# Patient Record
Sex: Female | Born: 1940 | Race: White | Hispanic: No | Smoking: Former smoker
Health system: Southern US, Community
[De-identification: ages and names within clinical notes are randomized; demographics above are authoritative.]

## PROBLEM LIST (undated history)

## (undated) DIAGNOSIS — R52 Pain, unspecified: Secondary | ICD-10-CM

## (undated) DIAGNOSIS — D649 Anemia, unspecified: Secondary | ICD-10-CM

## (undated) DIAGNOSIS — I10 Essential (primary) hypertension: Secondary | ICD-10-CM

## (undated) DIAGNOSIS — I2510 Atherosclerotic heart disease of native coronary artery without angina pectoris: Secondary | ICD-10-CM

## (undated) DIAGNOSIS — G2581 Restless legs syndrome: Secondary | ICD-10-CM

## (undated) DIAGNOSIS — I639 Cerebral infarction, unspecified: Secondary | ICD-10-CM

## (undated) DIAGNOSIS — J9600 Acute respiratory failure, unspecified whether with hypoxia or hypercapnia: Secondary | ICD-10-CM

## (undated) DIAGNOSIS — E119 Type 2 diabetes mellitus without complications: Secondary | ICD-10-CM

## (undated) DIAGNOSIS — M109 Gout, unspecified: Secondary | ICD-10-CM

## (undated) DIAGNOSIS — M1990 Unspecified osteoarthritis, unspecified site: Secondary | ICD-10-CM

## (undated) DIAGNOSIS — E785 Hyperlipidemia, unspecified: Secondary | ICD-10-CM

## (undated) DIAGNOSIS — N189 Chronic kidney disease, unspecified: Secondary | ICD-10-CM

## (undated) DIAGNOSIS — J449 Chronic obstructive pulmonary disease, unspecified: Secondary | ICD-10-CM

## (undated) DIAGNOSIS — K5900 Constipation, unspecified: Secondary | ICD-10-CM

## (undated) DIAGNOSIS — M6281 Muscle weakness (generalized): Secondary | ICD-10-CM

## (undated) HISTORY — DX: Hyperlipidemia, unspecified: E78.5

## (undated) HISTORY — PX: APPENDECTOMY: SHX54

## (undated) HISTORY — DX: Chronic kidney disease, unspecified: N18.9

## (undated) HISTORY — PX: EYE SURGERY: SHX253

## (undated) HISTORY — PX: TONSILLECTOMY: SUR1361

## (undated) HISTORY — DX: Chronic obstructive pulmonary disease, unspecified: J44.9

## (undated) HISTORY — DX: Unspecified osteoarthritis, unspecified site: M19.90

## (undated) HISTORY — DX: Atherosclerotic heart disease of native coronary artery without angina pectoris: I25.10

## (undated) HISTORY — DX: Restless legs syndrome: G25.81

## (undated) HISTORY — PX: ABDOMINAL HYSTERECTOMY: SHX81

---

## 2007-11-05 ENCOUNTER — Ambulatory Visit: Payer: Self-pay | Admitting: Family Medicine

## 2008-11-14 IMAGING — CR DG CHEST 2V
1 series · 2 of 2 positions shown · non-contrast
Comparison: none

REASON FOR EXAM: Chest congestion
COMMENTS:

PROCEDURE:     MDR - MDR CHEST PA(OR AP) AND LATERAL  - November 05, 2007  [DATE]
RESULT:     The lungs are clear. The cardiac silhouette and visualized bony
skeleton are unremarkable.

[Series 1: view not recorded · 0.17mm/px · 2 of 2 slices shown]
[im 1/2]
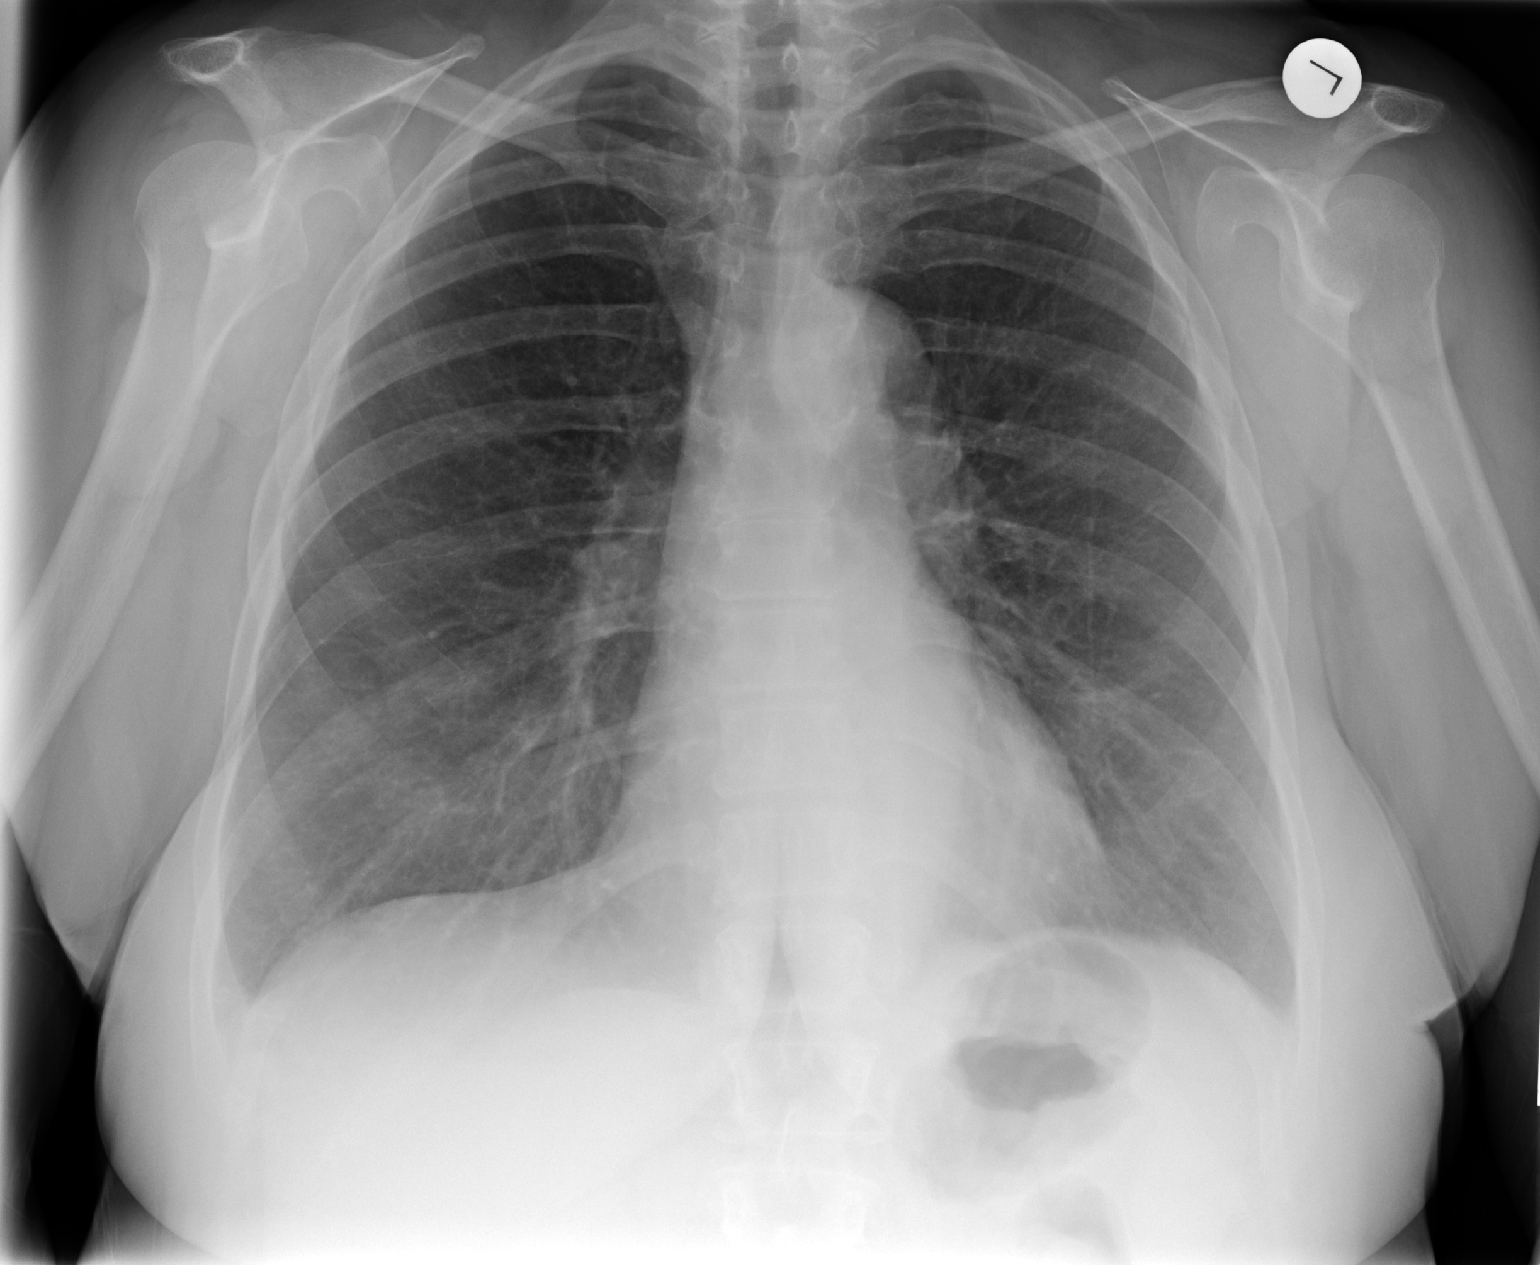
[im 2/2]
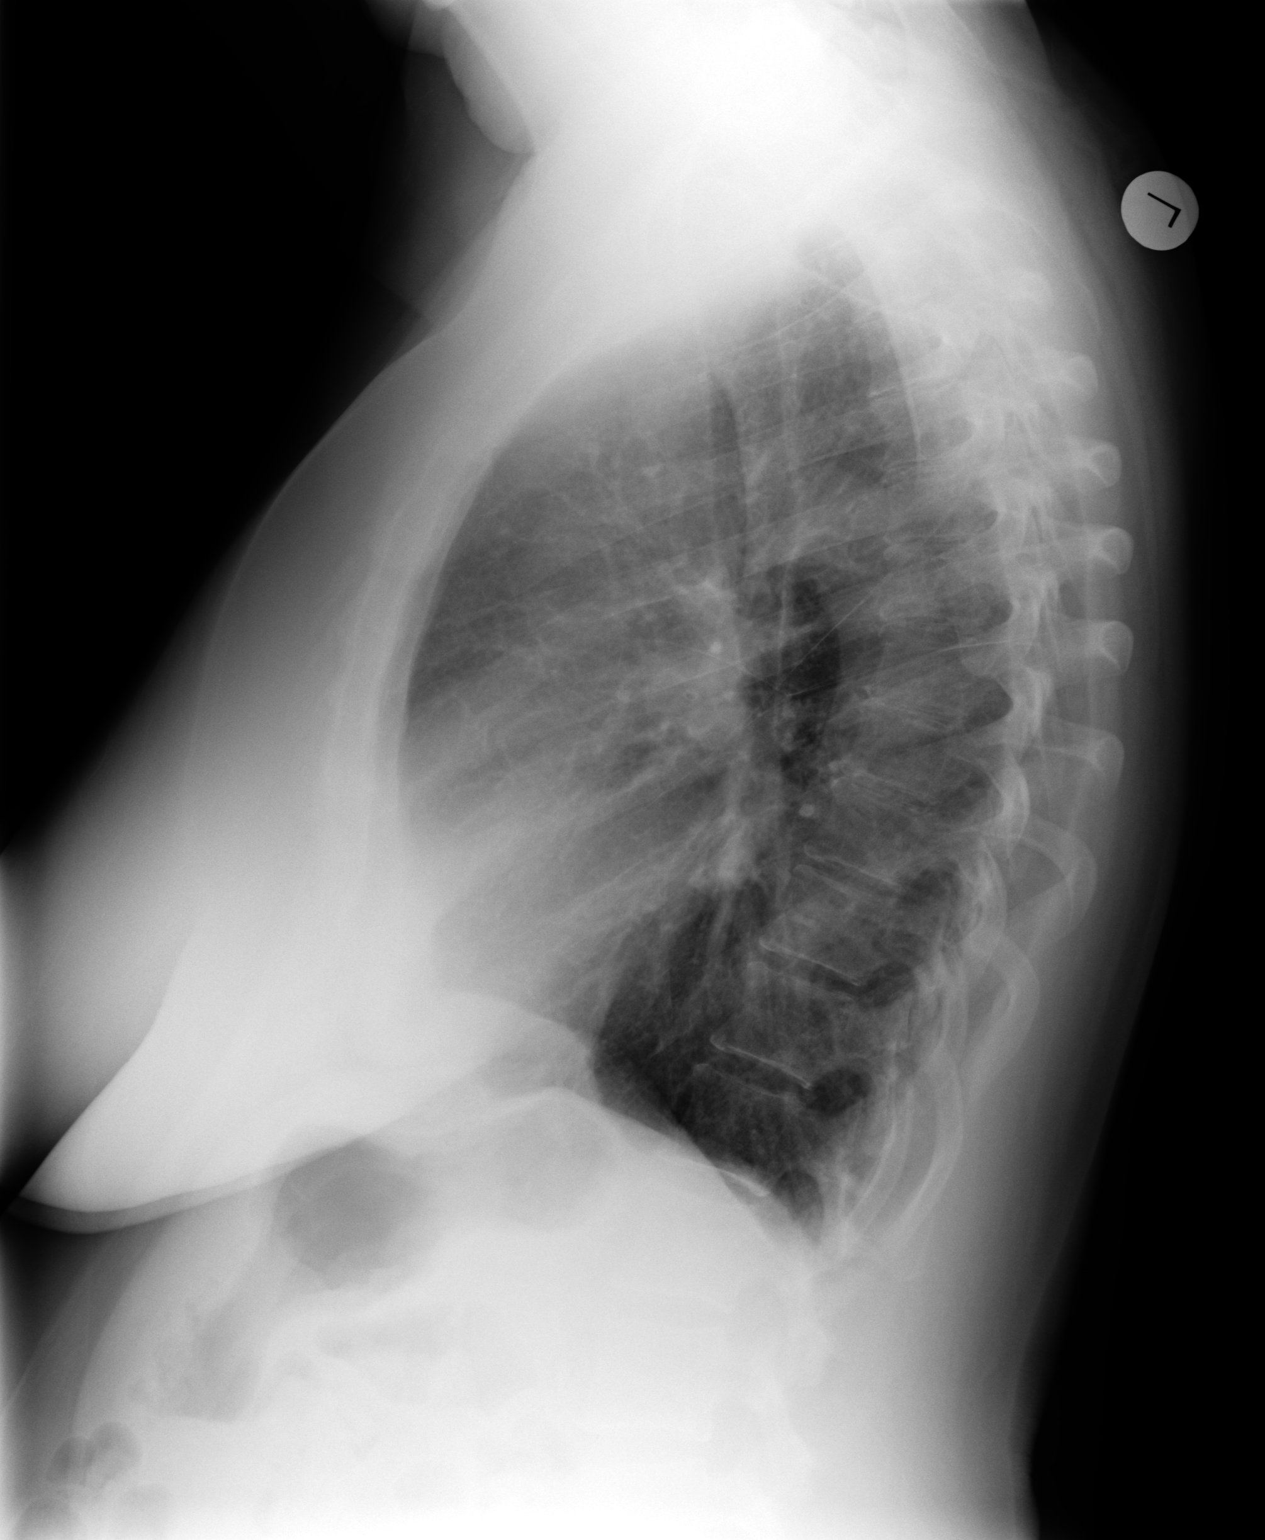

[2 of 2 positions shown; findings below may reference images not displayed]

IMPRESSION: Chest radiograph without evidence of acute cardiopulmonary
disease.

## 2011-08-07 ENCOUNTER — Emergency Department: Payer: Self-pay | Admitting: *Deleted

## 2011-08-07 ENCOUNTER — Ambulatory Visit: Payer: Self-pay | Admitting: Internal Medicine

## 2015-06-26 DIAGNOSIS — M8580 Other specified disorders of bone density and structure, unspecified site: Secondary | ICD-10-CM | POA: Insufficient documentation

## 2017-03-05 ENCOUNTER — Emergency Department
Admission: EM | Admit: 2017-03-05 | Discharge: 2017-03-06 | Disposition: A | Discharge status: Home / Self Care | Payer: Medicare Other | Attending: Emergency Medicine | Admitting: Emergency Medicine

## 2017-03-05 DIAGNOSIS — I10 Essential (primary) hypertension: Secondary | ICD-10-CM | POA: Diagnosis not present

## 2017-03-05 DIAGNOSIS — Z79899 Other long term (current) drug therapy: Secondary | ICD-10-CM | POA: Diagnosis not present

## 2017-03-05 DIAGNOSIS — E119 Type 2 diabetes mellitus without complications: Secondary | ICD-10-CM | POA: Insufficient documentation

## 2017-03-05 DIAGNOSIS — T783XXA Angioneurotic edema, initial encounter: Secondary | ICD-10-CM | POA: Diagnosis not present

## 2017-03-05 DIAGNOSIS — T7840XA Allergy, unspecified, initial encounter: Secondary | ICD-10-CM | POA: Diagnosis present

## 2017-03-05 HISTORY — DX: Type 2 diabetes mellitus without complications: E11.9

## 2017-03-05 HISTORY — DX: Essential (primary) hypertension: I10

## 2017-03-05 MED ORDER — METHYLPREDNISOLONE SODIUM SUCC 125 MG IJ SOLR
125.0000 mg | Freq: Once | INTRAMUSCULAR | Status: AC
  Administered 2017-03-05: 125 mg via INTRAVENOUS

## 2017-03-05 MED ORDER — FAMOTIDINE IN NACL 20-0.9 MG/50ML-% IV SOLN
INTRAVENOUS | Status: AC
  Filled 2017-03-05: qty 50

## 2017-03-05 MED ORDER — FAMOTIDINE IN NACL 20-0.9 MG/50ML-% IV SOLN
20.0000 mg | Freq: Once | INTRAVENOUS | Status: AC
  Administered 2017-03-05: 20 mg via INTRAVENOUS

## 2017-03-05 MED ORDER — DIPHENHYDRAMINE HCL 50 MG/ML IJ SOLN
INTRAMUSCULAR | Status: AC
  Filled 2017-03-05: qty 1

## 2017-03-05 MED ORDER — EPINEPHRINE 0.3 MG/0.3ML IJ SOAJ
INTRAMUSCULAR | Status: AC
  Filled 2017-03-05: qty 0.3

## 2017-03-05 NOTE — ED Provider Notes (Signed)
[facility] Emergency Department Provider Note   First MD Initiated Contact with Patient 03/05/17 2250     (approximate)  I have reviewed the triage vital signs and the nursing notes.   HISTORY  Chief Complaint Allergic Reaction    HPI Kelly Rowland is a 76 y.o. female with below list of chronic medical conditions including hypertension being treated with lisinopril presents to the emergency department with abrupt onset of tongue swelling at 8 PM tonight. Patient denies any dyspnea however does admit to mild difficulty swallowing. Patient denies any rash or pruritus. Patient denies any chest discomfort.   Past Medical History:  Diagnosis Date  . Diabetes mellitus without complication (HCC)   . Hypertension     There are no active problems to display for this patient.   No past surgical history on file.  Prior to Admission medications   Medication Sig Start Date End Date Taking? Authorizing Provider  amLODipine (NORVASC) 5 MG tablet Take 5 mg by mouth daily. 03/01/17   Historical Provider, MD  clopidogrel (PLAVIX) 75 MG tablet Take 75 mg by mouth daily. 03/01/17   Historical Provider, MD  metoprolol succinate (TOPROL-XL) 100 MG 24 hr tablet Take 50 mg by mouth daily. 01/11/17   Historical Provider, MD  oxyCODONE-acetaminophen (PERCOCET/ROXICET) 5-325 MG tablet Take 1-2 tablets by mouth every 6 (six) hours as needed for pain. 02/15/17   Historical Provider, MD  simvastatin (ZOCOR) 20 MG tablet Take 20 mg by mouth at bedtime. 01/30/17   Historical Provider, MD  VENTOLIN HFA 108 (90 Base) MCG/ACT inhaler Inhale 2 puffs into the lungs every 4 (four) hours as needed for shortness of breath, wheezing or cough. 01/11/17   Historical Provider, MD    Allergies Amoxicillin and Lisinopril  No family history on file.  Social History Social History  Substance Use Topics  . Smoking status: Not on file  . Smokeless tobacco: Not on file  . Alcohol use Not on file      Review of Systems Constitutional: No fever/chills Eyes: No visual changes. ENT: No sore throat.Positive for tongue swelling Cardiovascular: Denies chest pain. Respiratory: Denies shortness of breath. Gastrointestinal: No abdominal pain.  No nausea, no vomiting.  No diarrhea.  No constipation. Genitourinary: Negative for dysuria. Musculoskeletal: Negative for back pain. Skin: Negative for rash. Neurological: Negative for headaches, focal weakness or numbness.  10-point ROS otherwise negative.  ____________________________________________   PHYSICAL EXAM:  VITAL SIGNS: ED Triage Vitals  Enc Vitals Group     BP 03/05/17 2255 (!) 166/65     Pulse Rate 03/05/17 2255 64     Resp 03/05/17 2255 18     Temp 03/05/17 2255 97.8 F (36.6 C)     Temp Source 03/05/17 2255 Oral     SpO2 03/05/17 2255 96 %     Weight 03/05/17 2253 159 lb (72.1 kg)     Height 03/05/17 2253 5\' 2"  (1.575 m)     Head Circumference --      Peak Flow --      Pain Score 03/05/17 2253 0     Pain Loc --      Pain Edu? --      Excl. in GC? --     Constitutional: Alert and oriented. Well appearing and in no acute distress. Eyes: Conjunctivae are normal. PERRL. EOMI. Head: Atraumatic. Ears:  Healthy appearing ear canals and TMs bilaterally Nose: No congestion/rhinnorhea. Mouth/Throat: Mucous membranes are moist.  Oropharynx non-erythematous.Tongue swelling predominantly left side  of the tongue. Neck: No stridor.   Cardiovascular: Normal rate, regular rhythm. Good peripheral circulation. Grossly normal heart sounds. Respiratory: Normal respiratory effort.  No retractions. Lungs CTAB. Gastrointestinal: Soft and nontender. No distention.  Musculoskeletal: No lower extremity tenderness nor edema. No gross deformities of extremities. Neurologic:  Normal speech and language. No gross focal neurologic deficits are appreciated.  Skin:  Skin is warm, dry and intact. No rash noted. Psychiatric: Mood and affect  are normal. Speech and behavior are normal.  ____________________________________________     Procedures   ____________________________________________   INITIAL IMPRESSION / ASSESSMENT AND PLAN / ED COURSE  Pertinent labs & imaging results that were available during my care of the patient were reviewed by me and considered in my medical decision making (see chart for details).  Patient received Solu-Medrol and Pepcid on arrival to the emergency department. Benadryl was not given as the patient states that she gets really agitated with Benadryl.   ----------------------------------------- 5:52 AM on 03/06/2017 -----------------------------------------  On reevaluation patient's tongue is now normal in appearance. Patient denies any difficulty swallowing or breathing.      ____________________________________________  FINAL CLINICAL IMPRESSION(S) / ED DIAGNOSES  Final diagnoses:  Angioedema, initial encounter     MEDICATIONS GIVEN DURING THIS VISIT:  Medications  EPINEPHrine (EPI-PEN) 0.3 mg/0.3 mL injection (not administered)  methylPREDNISolone sodium succinate (SOLU-MEDROL) 125 mg/2 mL injection 125 mg (125 mg Intravenous Given 03/05/17 2306)  famotidine (PEPCID) IVPB 20 mg premix (0 mg Intravenous Stopped 03/05/17 2334)     NEW OUTPATIENT MEDICATIONS STARTED DURING THIS VISIT:  New Prescriptions   No medications on file    Modified Medications   No medications on file    Discontinued Medications   No medications on file     Note:  This document was prepared using Dragon voice recognition software and may include unintentional dictation errors.    Darci Current, MD 03/06/17 [phone]

## 2017-03-05 NOTE — ED Triage Notes (Addendum)
Pt reports to ED w/ c/o angioedema. Pt sts she takes lisinopril. Sts that this has happened once before, sts allegic to amoxicillin.  Pt able to talk, tongue swollen. Pt denies SOB, reports difficulty swallowing. Sts reaction began 2000

## 2017-03-06 MED ORDER — TRIAMTERENE-HCTZ 37.5-25 MG PO TABS: 1.0000 | ORAL_TABLET | Freq: Every day | ORAL | 0 refills | Status: DC

## 2017-08-19 DIAGNOSIS — R609 Edema, unspecified: Secondary | ICD-10-CM | POA: Insufficient documentation

## 2017-08-19 DIAGNOSIS — R600 Localized edema: Secondary | ICD-10-CM | POA: Insufficient documentation

## 2017-08-23 DIAGNOSIS — I509 Heart failure, unspecified: Secondary | ICD-10-CM | POA: Insufficient documentation

## 2017-08-23 DIAGNOSIS — E782 Mixed hyperlipidemia: Secondary | ICD-10-CM | POA: Insufficient documentation

## 2017-08-23 DIAGNOSIS — I5033 Acute on chronic diastolic (congestive) heart failure: Secondary | ICD-10-CM | POA: Insufficient documentation

## 2017-10-06 DIAGNOSIS — I2510 Atherosclerotic heart disease of native coronary artery without angina pectoris: Secondary | ICD-10-CM | POA: Insufficient documentation

## 2017-11-09 DIAGNOSIS — G2581 Restless legs syndrome: Secondary | ICD-10-CM | POA: Insufficient documentation

## 2017-11-09 DIAGNOSIS — I739 Peripheral vascular disease, unspecified: Secondary | ICD-10-CM | POA: Insufficient documentation

## 2017-11-17 ENCOUNTER — Other Ambulatory Visit (HOSPITAL_COMMUNITY)
Admission: RE | Admit: 2017-11-17 | Discharge: 2017-11-17 | Disposition: A | Discharge status: Home / Self Care | Payer: Medicare Other | Source: Other Acute Inpatient Hospital | Attending: Urology | Admitting: Urology

## 2017-11-17 DIAGNOSIS — N390 Urinary tract infection, site not specified: Secondary | ICD-10-CM | POA: Insufficient documentation

## 2017-11-20 LAB — URINE CULTURE

## 2017-12-14 HISTORY — PX: CATARACT EXTRACTION: SUR2

## 2018-07-26 DIAGNOSIS — J449 Chronic obstructive pulmonary disease, unspecified: Secondary | ICD-10-CM | POA: Insufficient documentation

## 2018-11-22 DIAGNOSIS — E119 Type 2 diabetes mellitus without complications: Secondary | ICD-10-CM | POA: Insufficient documentation

## 2018-12-26 DIAGNOSIS — I6523 Occlusion and stenosis of bilateral carotid arteries: Secondary | ICD-10-CM | POA: Insufficient documentation

## 2019-12-05 DIAGNOSIS — I10 Essential (primary) hypertension: Secondary | ICD-10-CM | POA: Insufficient documentation

## 2020-02-13 DIAGNOSIS — I739 Peripheral vascular disease, unspecified: Secondary | ICD-10-CM | POA: Insufficient documentation

## 2021-02-19 DIAGNOSIS — N2581 Secondary hyperparathyroidism of renal origin: Secondary | ICD-10-CM | POA: Insufficient documentation

## 2021-06-10 DIAGNOSIS — M109 Gout, unspecified: Secondary | ICD-10-CM | POA: Insufficient documentation

## 2021-06-10 HISTORY — DX: Gout, unspecified: M10.9

## 2021-12-11 ENCOUNTER — Other Ambulatory Visit: Payer: Self-pay

## 2021-12-11 ENCOUNTER — Encounter: Payer: Medicare Other | Attending: Family Medicine | Admitting: *Deleted

## 2021-12-11 ENCOUNTER — Encounter: Payer: Self-pay | Admitting: *Deleted

## 2021-12-11 VITALS — BP 158/82 | Ht 61.0 in | Wt 157.9 lb

## 2021-12-11 DIAGNOSIS — I2510 Atherosclerotic heart disease of native coronary artery without angina pectoris: Secondary | ICD-10-CM | POA: Diagnosis not present

## 2021-12-11 DIAGNOSIS — E1165 Type 2 diabetes mellitus with hyperglycemia: Secondary | ICD-10-CM | POA: Insufficient documentation

## 2021-12-11 DIAGNOSIS — Z794 Long term (current) use of insulin: Secondary | ICD-10-CM

## 2021-12-11 NOTE — Patient Instructions (Addendum)
Check blood sugars 1 x day before breakfast or 2 hrs after supper every day Bring blood sugar records to the next appointment  Call your doctor for a prescription for your strips and lancets  Exercise:  Begin chair exercises as tolerated   Eat 3 meals day,   1-2  snacks a day Space meals 4-6 hours apart Include 1 serving of protein when eating fruit for a snack  Make an eye doctor appointment  Carry fast acting glucose and a snack at all times Rotate injection sites Do Not shake insulin bottle  Return for appointment on: Tuesday December 30, 2021 at 11:00 am with Velna Hatchet (nurse)

## 2021-12-11 NOTE — Progress Notes (Signed)
Diabetes Self-Management Education  Visit Type: First/Initial  Appt. Start Time: 1040 Appt. End Time: 1200  12/11/2021  [name], identified by name and date of birth, is a 80 y.o. female with a diagnosis of Diabetes: Type 2.   ASSESSMENT  Blood pressure (!) 158/82, height 5\' 1"  (1.549 m), weight 157 lb 14.4 oz (71.6 kg). Body mass index is 29.83 kg/m.   Diabetes Self-Management Education - 12/11/21 1215       Visit Information   Visit Type First/Initial      Initial Visit   Diabetes Type Type 2    Are you currently following a meal plan? Yes    What type of meal plan do you follow? "watch sugar and no salt'    Are you taking your medications as prescribed? Yes    Date Diagnosed 18 years ago      Health Coping   How would you rate your overall health? Fair      Psychosocial Assessment   Patient Belief/Attitude about Diabetes Other (comment)   "same"   Self-care barriers None    Self-management support Doctor's office;Family    Patient Concerns Nutrition/Meal planning;Medication;Monitoring;Glycemic Control    Special Needs None    Preferred Learning Style Visual;Other (comment)   talking/discussion   Learning Readiness Ready    How often do you need to have someone help you when you read instructions, pamphlets, or other written materials from your doctor or pharmacy? 1 - Never      Pre-Education Assessment   Patient understands the diabetes disease and treatment process. Needs Review    Patient understands incorporating nutritional management into lifestyle. Needs Instruction    Patient undertands incorporating physical activity into lifestyle. Needs Instruction    Patient understands using medications safely. Needs Review    Patient understands monitoring blood glucose, interpreting and using results Needs Review    Patient understands prevention, detection, and treatment of acute complications. Needs Review    Patient understands prevention, detection, and  treatment of chronic complications. Needs Review    Patient understands how to develop strategies to address psychosocial issues. Needs Review    Patient understands how to develop strategies to promote health/change behavior. Needs Review      Complications   Last HgB A1C per patient/outside source 8.9 %   12/01/2021   How often do you check your blood sugar? 1-2 times/day    Fasting Blood glucose range (mg/dL) 12/03/2021   She reports FBG's 170's mg/dL with reading of 852-778 mg/dL today.   Number of hypoglycemic episodes per month 1    Can you tell when your blood sugar is low? Yes    What do you do if your blood sugar is low? carries glucose drink - has trouble chewing tablets    Have you had a dilated eye exam in the past 12 months? No    Have you had a dental exam in the past 12 months? No   dentures   Are you checking your feet? Yes    How many days per week are you checking your feet? 7      Dietary Intake   Breakfast pt eats same meals - oatmeal with cinnamon and sausage or bacon; sometimes cheerios, milk with sausage or bacon    Lunch "forgets" or eats ham sandwich or home made chili    Snack (afternoon) reports that she may snack on fruit (apple, orange, banana, grapefruit)    Dinner 1 bowl of home made vegetable  soup with beef and frozen vegetables (carrots, green beans, peas, corn, okra); sometimes chicken livers with BBQ sauce and sweet potato    Beverage(s) water      Exercise   Exercise Type ADL's      Patient Education   Previous Diabetes Education Yes (please comment)   2004 - unsure where   Disease state  Explored patient's options for treatment of their diabetes    Nutrition management  Role of diet in the treatment of diabetes and the relationship between the three main macronutrients and blood glucose level;Food label reading, portion sizes and measuring food.;Reviewed blood glucose goals for pre and post meals and how to evaluate the patients' food intake on their blood  glucose level.;Meal timing in regards to the patients' current diabetes medication.    Physical activity and exercise  Role of exercise on diabetes management, blood pressure control and cardiac health.   chair exercises   Medications Taught/reviewed insulin injection, site rotation, insulin storage and needle disposal.;Reviewed patients medication for diabetes, action, purpose, timing of dose and side effects.   Pt reports she was taught to shake insulin.   Monitoring Purpose and frequency of SMBG.;Taught/discussed recording of test results and interpretation of SMBG.;Identified appropriate SMBG and/or A1C goals.;Yearly dilated eye exam    Acute complications Taught treatment of hypoglycemia - the 15 rule.    Chronic complications Relationship between chronic complications and blood glucose control    Psychosocial adjustment Role of stress on diabetes;Identified and addressed patients feelings and concerns about diabetes      Individualized Goals (developed by patient)   Reducing Risk Other (comment)   improve blood sugars, decrease medications, prevent diabetes complications     Outcomes   Expected Outcomes Demonstrated interest in learning. Expect positive outcomes    Future DMSE --   3 weeks       Individualized Plan for Diabetes Self-Management Training:   Learning Objective:  Patient will have a greater understanding of diabetes self-management. Patient education plan is to attend individual and/or group sessions per assessed needs and concerns.   Plan:   Patient Instructions  Check blood sugars 1 x day before breakfast or 2 hrs after supper every day Bring blood sugar records to the next appointment  Call your doctor for a prescription for your strips and lancets  Exercise:  Begin chair exercises as tolerated   Eat 3 meals day,   1-2  snacks a day Space meals 4-6 hours apart Include 1 serving of protein when eating fruit for a snack  Make an eye doctor appointment  Carry  fast acting glucose and a snack at all times Rotate injection sites Do Not shake insulin bottle  Return for appointment on: Tuesday December 30, 2021 at 11:00 am with Kelly Rowland (nurse)   Expected Outcomes:  Demonstrated interest in learning. Expect positive outcomes  Education material provided:  General Meal Planning Guidelines Simple Meal Plan Symptoms, causes and treatments of Hypoglycemia  Injection Guide (BD)  If problems or questions, patient to contact team via:   Kelly Settler, RN, CCM, CDCES [phone]  Future DSME appointment:  (3 weeks) The patient had Diabetes education in the past. She will attend the 2 Hour Refresher Program and her next appointment is scheduled for December 30, 2021 with this nurse.

## 2021-12-30 ENCOUNTER — Telehealth: Payer: Self-pay | Admitting: *Deleted

## 2021-12-30 ENCOUNTER — Ambulatory Visit: Payer: Medicare Other | Admitting: *Deleted

## 2021-12-30 NOTE — Telephone Encounter (Signed)
Patient left voice mail that she needed to cancel today's appointment. Phone call to her and she reports that she is having back pain and is not able to drive. She reports sometimes pain lasts days or weeks. She reports that she will call back to schedule a follow up.

## 2022-01-22 ENCOUNTER — Encounter: Payer: Self-pay | Admitting: *Deleted

## 2022-01-22 ENCOUNTER — Encounter: Payer: Medicare Other | Attending: Family Medicine | Admitting: *Deleted

## 2022-01-22 ENCOUNTER — Other Ambulatory Visit: Payer: Self-pay

## 2022-01-22 VITALS — BP 156/70 | Wt 153.4 lb

## 2022-01-22 DIAGNOSIS — E1165 Type 2 diabetes mellitus with hyperglycemia: Secondary | ICD-10-CM | POA: Diagnosis not present

## 2022-01-22 DIAGNOSIS — Z713 Dietary counseling and surveillance: Secondary | ICD-10-CM | POA: Insufficient documentation

## 2022-01-22 DIAGNOSIS — E1122 Type 2 diabetes mellitus with diabetic chronic kidney disease: Secondary | ICD-10-CM

## 2022-01-22 NOTE — Progress Notes (Signed)
Diabetes Self-Management Education  Visit Type: Follow-up  Appt. Start Time: 1520 Appt. End Time: 1620  01/22/2022  [name], identified by name and date of birth, is a 81 y.o. female with a diagnosis of Diabetes: Type 2.   ASSESSMENT  Blood pressure (!) 156/70, weight 153 lb 6.4 oz (69.6 kg). Body mass index is 28.98 kg/m.   Diabetes Self-Management Education - 01/22/22 1631       Visit Information   Visit Type Follow-up      Initial Visit   Diabetes Type Type 2      Complications   How often do you check your blood sugar? 1-2 times/day    Fasting Blood glucose range (mg/dL) 026-378;58-850;277-412   FBG's 127-195 mg/dL   Postprandial Blood glucose range (mg/dL) 878-676;>720   5 pp readings 188-313 mg/dL   Number of hypoglycemic episodes per month 0    Can you tell when your blood sugar is low? Yes    What do you do if your blood sugar is low? has glucose drink in her pocketbook    Have you had a dilated eye exam in the past 12 months? Yes    Have you had a dental exam in the past 12 months? No   dentures   Are you checking your feet? Yes    How many days per week are you checking your feet? 7      Dietary Intake   Breakfast 2 meals and 1 snack/day      Exercise   Exercise Type ADL's   pt has started chair exercises (has video) and walking in place but only for 5 counts     Patient Education   Disease state  Explored patient's options for treatment of their diabetes    Nutrition management  Role of diet in the treatment of diabetes and the relationship between the three main macronutrients and blood glucose level;Food label reading, portion sizes and measuring food.;Reviewed blood glucose goals for pre and post meals and how to evaluate the patients' food intake on their blood glucose level.;Meal timing in regards to the patients' current diabetes medication.    Physical activity and exercise  Role of exercise on diabetes management, blood pressure control and  cardiac health.    Medications Reviewed patients medication for diabetes, action, purpose, timing of dose and side effects.;Other (comment)   Discussed possibility of taking 2nd dose of NPH insulin before supper. She will send her MD a note to discuss.   Monitoring Taught/discussed recording of test results and interpretation of SMBG.;Identified appropriate SMBG and/or A1C goals.    Acute complications Taught treatment of hypoglycemia - the 15 rule.    Chronic complications Relationship between chronic complications and blood glucose control    Psychosocial adjustment Role of stress on diabetes;Identified and addressed patients feelings and concerns about diabetes;Other (comment)   Role of pain on diabetes     Individualized Goals (developed by patient)   Nutrition Follow meal plan discussed    Physical Activity Exercise 1-2 times per week    Medications take my medication as prescribed    Monitoring  test my blood glucose as discussed      Post-Education Assessment   Patient understands the diabetes disease and treatment process. Demonstrates understanding / competency    Patient understands incorporating nutritional management into lifestyle. Needs Review    Patient undertands incorporating physical activity into lifestyle. Demonstrates understanding / competency    Patient understands using medications safely. Demonstrates understanding /  competency    Patient understands monitoring blood glucose, interpreting and using results Demonstrates understanding / competency    Patient understands prevention, detection, and treatment of acute complications. Demonstrates understanding / competency    Patient understands prevention, detection, and treatment of chronic complications. Demonstrates understanding / competency    Patient understands how to develop strategies to address psychosocial issues. Needs Review    Patient understands how to develop strategies to promote health/change behavior.  Demonstrates understanding / competency      Outcomes   Expected Outcomes Demonstrated interest in learning. Expect positive outcomes    Program Status Completed      Subsequent Visit   Since your last visit have you continued or begun to take your medications as prescribed? Yes    Since your last visit have you had your blood pressure checked? No    Since your last visit have you experienced any weight changes? Loss    Weight Loss (lbs) 4.5    Since your last visit, are you checking your blood glucose at least once a day? Yes        Individualized Plan for Diabetes Self-Management Training:   Learning Objective:  Patient will have a greater understanding of diabetes self-management. Patient education plan is to attend individual and/or group sessions per assessed needs and concerns.   Plan:   Patient Instructions  Check blood sugars 2 x day before breakfast and before supper every day  Continue chair exercises and walking in place as tolerated    Eat 3 meals day,   1-2  snacks a day Space meals 4-6 hours apart Don't skip meals - eat at least 1 protein and 1 carbohydrate serving  Carry fast acting glucose and a snack at all times Rotate injection sites   Expected Outcomes:  Demonstrated interest in learning. Expect positive outcomes  Education material provided:  Planning a Balanced Meal  If problems or questions, patient to contact team via:   Sharion Settler, RN, CCM, CDCES [phone]  Future DSME appointment: PRN

## 2022-01-22 NOTE — Patient Instructions (Addendum)
Check blood sugars 2 x day before breakfast and before supper every day  Continue chair exercises and walking in place as tolerated    Eat 3 meals day,   1-2  snacks a day Space meals 4-6 hours apart Don't skip meals - eat at least 1 protein and 1 carbohydrate serving  Carry fast acting glucose and a snack at all times Rotate injection sites

## 2022-07-02 DIAGNOSIS — M659 Synovitis and tenosynovitis, unspecified: Secondary | ICD-10-CM | POA: Insufficient documentation

## 2022-09-16 ENCOUNTER — Encounter: Payer: Self-pay | Admitting: Internal Medicine

## 2022-09-16 ENCOUNTER — Encounter: Payer: Medicare Other | Admitting: Internal Medicine

## 2022-09-16 VITALS — BP 138/62 | HR 85 | Ht 61.0 in | Wt 154.0 lb

## 2022-09-16 DIAGNOSIS — M48061 Spinal stenosis, lumbar region without neurogenic claudication: Secondary | ICD-10-CM

## 2022-09-16 DIAGNOSIS — M47817 Spondylosis without myelopathy or radiculopathy, lumbosacral region: Secondary | ICD-10-CM | POA: Insufficient documentation

## 2022-09-16 DIAGNOSIS — R52 Pain, unspecified: Secondary | ICD-10-CM

## 2022-09-16 HISTORY — DX: Spinal stenosis, lumbar region without neurogenic claudication: M48.061

## 2022-09-16 NOTE — Progress Notes (Signed)
Date:  09/16/2022   Name:  Kelly Rowland   DOB:  08-Jan-1941   MRN:  [id number]   Chief Complaint: Flu Vaccine and pneumonia 20 Wants pain medication for chronic pain. HPI  No results found for: "NA", "K", "CO2", "GLUCOSE", "BUN", "CREATININE", "CALCIUM", "EGFR", "GFRNONAA" No results found for: "CHOL", "HDL", "LDLCALC", "LDLDIRECT", "TRIG", "CHOLHDL" No results found for: "TSH" No results found for: "HGBA1C" No results found for: "WBC", "HGB", "HCT", "MCV", "PLT" No results found for: "ALT", "AST", "GGT", "ALKPHOS", "BILITOT" No results found for: "25OHVITD2", "25OHVITD3", "VD25OH"   Review of Systems  Patient Active Problem List   Diagnosis Date Noted   Lumbosacral spondylosis without myelopathy 09/16/2022   Spinal stenosis of lumbar region 09/16/2022   Synovitis of ankle 07/02/2022   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   Hyperparathyroidism due to renal insufficiency ([place]) 02/19/2021   Intermittent claudication ([place]) 02/13/2020   Essential hypertension 12/05/2019   Bilateral carotid artery stenosis 12/26/2018   Diabetes mellitus ([place]) 11/22/2018   Moderate chronic obstructive pulmonary disease ([place]) 07/26/2018   Peripheral arterial disease ([place]) 11/09/2017   Restless legs 11/09/2017   Coronary artery disease 10/06/2017   Mixed hyperlipidemia 08/23/2017   Heart failure, unspecified ([place]) 08/23/2017   Peripheral edema 08/19/2017   Osteopenia 06/26/2015    Allergies  Allergen Reactions   Amoxicillin Anaphylaxis   Lisinopril Anaphylaxis   Tramadol Nausea Only   Gabapentin Other (See Comments)    Hands jerking    Past Surgical History:  Procedure Laterality Date   ABDOMINAL HYSTERECTOMY     APPENDECTOMY     CATARACT EXTRACTION Bilateral 2019   TONSILLECTOMY      Social History   Tobacco Use   Smoking status: Former    Packs/day: 1.00    Years: 50.00    Total pack years: 50.00    Types: Cigarettes    Quit date: 09/13/2017    Years since quitting: 5.0    Smokeless tobacco: Never  Vaping Use   Vaping Use: Never used  Substance Use Topics   Alcohol use: Never   Drug use: Never     Medication list has been reviewed and updated.  Current Meds  Medication Sig   aspirin 81 MG EC tablet Take 81 mg by mouth daily.   carvedilol (COREG) 25 MG tablet Take 25 mg by mouth 2 (two) times daily.   Cholecalciferol 50 MCG (2000 UT) CAPS Take 1 capsule by mouth daily.   fluticasone-salmeterol (ADVAIR) 250-50 MCG/ACT AEPB Inhale 1 puff into the lungs 2 (two) times daily.   folic acid (FOLVITE) 621 MCG tablet Take 1 tablet by mouth daily.   furosemide (LASIX) 20 MG tablet Take 20-40 mg by mouth daily.   glimepiride (AMARYL) 4 MG tablet Take 4 mg by mouth daily.   KLOR-CON M20 20 MEQ tablet Take 20 mEq by mouth daily.   NOVOLIN N 100 UNIT/ML injection Inject 24 Units into the skin daily.   oxyCODONE-acetaminophen (PERCOCET/ROXICET) 5-325 MG tablet Take 1-2 tablets by mouth every 6 (six) hours as needed for pain.   simvastatin (ZOCOR) 20 MG tablet Take 20 mg by mouth at bedtime.   triamterene-hydrochlorothiazide (MAXZIDE-25) 37.5-25 MG tablet Take 1 tablet by mouth daily.   VENTOLIN HFA 108 (90 Base) MCG/ACT inhaler Inhale 2 puffs into the lungs every 4 (four) hours as needed for shortness of breath, wheezing or cough.   vitamin B-12 (CYANOCOBALAMIN) 500 MCG tablet Take 1 tablet by mouth daily.       09/16/2022  2:18 PM  GAD 7 : Generalized Anxiety Score  Nervous, Anxious, on Edge 0  Control/stop worrying 0  Worry too much - different things 0  Trouble relaxing 0  Restless 0  Easily annoyed or irritable 0  Afraid - awful might happen 0  Total GAD 7 Score 0  Anxiety Difficulty Not difficult at all       09/16/2022    2:17 PM 12/11/2021   10:50 AM  Depression screen PHQ 2/9  Decreased Interest 0 0  Down, Depressed, Hopeless 1 0  PHQ - 2 Score 1 0  Altered sleeping 0   Tired, decreased energy 0   Change in appetite 0   Feeling bad or  failure about yourself  0   Trouble concentrating 0   Moving slowly or fidgety/restless 0   Suicidal thoughts 0   PHQ-9 Score 1   Difficult doing work/chores Somewhat difficult     BP Readings from Last 3 Encounters:  09/16/22 138/62  01/22/22 (!) 156/70  12/11/21 (!) 158/82    Physical Exam  Wt Readings from Last 3 Encounters:  09/16/22 154 lb (69.9 kg)  01/22/22 153 lb 6.4 oz (69.6 kg)  12/11/21 157 lb 14.4 oz (71.6 kg)    BP 138/62 (Cuff Size: Large)   Pulse 85   Ht 5' 1"  (1.549 m)   Wt 154 lb (69.9 kg)   SpO2 91%   BMI 29.10 kg/m   Assessment and Plan: Patient was not seen today.  She came here for a prescription of pain medications because the pain clinic will not prescribe them. When she learned that I do not prescribe pain medications, she decided to terminate the visit and return to her usual PCP in [place].  Halina Maidens MD

## 2022-10-17 ENCOUNTER — Inpatient Hospital Stay
Admission: EM | Admit: 2022-10-17 | Discharge: 2022-11-04 | DRG: 377 | Disposition: A | Discharge status: Skilled Nursing Facility | Payer: Medicare Other | Attending: Internal Medicine | Admitting: Internal Medicine

## 2022-10-17 ENCOUNTER — Other Ambulatory Visit: Payer: Self-pay

## 2022-10-17 DIAGNOSIS — K5730 Diverticulosis of large intestine without perforation or abscess without bleeding: Secondary | ICD-10-CM | POA: Diagnosis present

## 2022-10-17 DIAGNOSIS — M109 Gout, unspecified: Secondary | ICD-10-CM | POA: Diagnosis present

## 2022-10-17 DIAGNOSIS — N189 Chronic kidney disease, unspecified: Secondary | ICD-10-CM | POA: Diagnosis not present

## 2022-10-17 DIAGNOSIS — K269 Duodenal ulcer, unspecified as acute or chronic, without hemorrhage or perforation: Secondary | ICD-10-CM | POA: Diagnosis not present

## 2022-10-17 DIAGNOSIS — E871 Hypo-osmolality and hyponatremia: Secondary | ICD-10-CM | POA: Insufficient documentation

## 2022-10-17 DIAGNOSIS — Z87892 Personal history of anaphylaxis: Secondary | ICD-10-CM

## 2022-10-17 DIAGNOSIS — R079 Chest pain, unspecified: Secondary | ICD-10-CM | POA: Diagnosis not present

## 2022-10-17 DIAGNOSIS — I6523 Occlusion and stenosis of bilateral carotid arteries: Secondary | ICD-10-CM | POA: Diagnosis not present

## 2022-10-17 DIAGNOSIS — G2581 Restless legs syndrome: Secondary | ICD-10-CM | POA: Diagnosis present

## 2022-10-17 DIAGNOSIS — Z79891 Long term (current) use of opiate analgesic: Secondary | ICD-10-CM

## 2022-10-17 DIAGNOSIS — K922 Gastrointestinal hemorrhage, unspecified: Secondary | ICD-10-CM | POA: Diagnosis present

## 2022-10-17 DIAGNOSIS — J449 Chronic obstructive pulmonary disease, unspecified: Secondary | ICD-10-CM | POA: Diagnosis present

## 2022-10-17 DIAGNOSIS — I772 Rupture of artery: Secondary | ICD-10-CM | POA: Diagnosis present

## 2022-10-17 DIAGNOSIS — K264 Chronic or unspecified duodenal ulcer with hemorrhage: Secondary | ICD-10-CM | POA: Diagnosis present

## 2022-10-17 DIAGNOSIS — Z1152 Encounter for screening for COVID-19: Secondary | ICD-10-CM | POA: Diagnosis not present

## 2022-10-17 DIAGNOSIS — J9601 Acute respiratory failure with hypoxia: Secondary | ICD-10-CM | POA: Diagnosis not present

## 2022-10-17 DIAGNOSIS — E782 Mixed hyperlipidemia: Secondary | ICD-10-CM | POA: Diagnosis present

## 2022-10-17 DIAGNOSIS — I252 Old myocardial infarction: Secondary | ICD-10-CM

## 2022-10-17 DIAGNOSIS — E876 Hypokalemia: Secondary | ICD-10-CM | POA: Diagnosis present

## 2022-10-17 DIAGNOSIS — I639 Cerebral infarction, unspecified: Secondary | ICD-10-CM | POA: Diagnosis not present

## 2022-10-17 DIAGNOSIS — Z23 Encounter for immunization: Secondary | ICD-10-CM | POA: Diagnosis present

## 2022-10-17 DIAGNOSIS — J189 Pneumonia, unspecified organism: Secondary | ICD-10-CM | POA: Diagnosis not present

## 2022-10-17 DIAGNOSIS — D62 Acute posthemorrhagic anemia: Secondary | ICD-10-CM | POA: Diagnosis present

## 2022-10-17 DIAGNOSIS — E1151 Type 2 diabetes mellitus with diabetic peripheral angiopathy without gangrene: Secondary | ICD-10-CM | POA: Diagnosis present

## 2022-10-17 DIAGNOSIS — K260 Acute duodenal ulcer with hemorrhage: Principal | ICD-10-CM | POA: Diagnosis present

## 2022-10-17 DIAGNOSIS — I63239 Cerebral infarction due to unspecified occlusion or stenosis of unspecified carotid arteries: Secondary | ICD-10-CM | POA: Insufficient documentation

## 2022-10-17 DIAGNOSIS — E1165 Type 2 diabetes mellitus with hyperglycemia: Secondary | ICD-10-CM | POA: Diagnosis present

## 2022-10-17 DIAGNOSIS — D649 Anemia, unspecified: Secondary | ICD-10-CM | POA: Diagnosis not present

## 2022-10-17 DIAGNOSIS — I129 Hypertensive chronic kidney disease with stage 1 through stage 4 chronic kidney disease, or unspecified chronic kidney disease: Secondary | ICD-10-CM | POA: Diagnosis present

## 2022-10-17 DIAGNOSIS — E11649 Type 2 diabetes mellitus with hypoglycemia without coma: Secondary | ICD-10-CM | POA: Diagnosis present

## 2022-10-17 DIAGNOSIS — Z833 Family history of diabetes mellitus: Secondary | ICD-10-CM

## 2022-10-17 DIAGNOSIS — I63512 Cerebral infarction due to unspecified occlusion or stenosis of left middle cerebral artery: Secondary | ICD-10-CM | POA: Diagnosis not present

## 2022-10-17 DIAGNOSIS — K641 Second degree hemorrhoids: Secondary | ICD-10-CM | POA: Diagnosis present

## 2022-10-17 DIAGNOSIS — K921 Melena: Secondary | ICD-10-CM | POA: Diagnosis not present

## 2022-10-17 DIAGNOSIS — Z87891 Personal history of nicotine dependence: Secondary | ICD-10-CM

## 2022-10-17 DIAGNOSIS — Z955 Presence of coronary angioplasty implant and graft: Secondary | ICD-10-CM

## 2022-10-17 DIAGNOSIS — M8580 Other specified disorders of bone density and structure, unspecified site: Secondary | ICD-10-CM | POA: Diagnosis present

## 2022-10-17 DIAGNOSIS — M549 Dorsalgia, unspecified: Secondary | ICD-10-CM | POA: Diagnosis present

## 2022-10-17 DIAGNOSIS — I6521 Occlusion and stenosis of right carotid artery: Secondary | ICD-10-CM | POA: Diagnosis present

## 2022-10-17 DIAGNOSIS — M1990 Unspecified osteoarthritis, unspecified site: Secondary | ICD-10-CM | POA: Diagnosis present

## 2022-10-17 DIAGNOSIS — N1832 Chronic kidney disease, stage 3b: Secondary | ICD-10-CM | POA: Diagnosis present

## 2022-10-17 DIAGNOSIS — D500 Iron deficiency anemia secondary to blood loss (chronic): Secondary | ICD-10-CM | POA: Insufficient documentation

## 2022-10-17 DIAGNOSIS — E1122 Type 2 diabetes mellitus with diabetic chronic kidney disease: Secondary | ICD-10-CM | POA: Diagnosis present

## 2022-10-17 DIAGNOSIS — R531 Weakness: Secondary | ICD-10-CM | POA: Diagnosis not present

## 2022-10-17 DIAGNOSIS — Z888 Allergy status to other drugs, medicaments and biological substances status: Secondary | ICD-10-CM

## 2022-10-17 DIAGNOSIS — I2582 Chronic total occlusion of coronary artery: Secondary | ICD-10-CM | POA: Diagnosis present

## 2022-10-17 DIAGNOSIS — Z8711 Personal history of peptic ulcer disease: Secondary | ICD-10-CM

## 2022-10-17 DIAGNOSIS — J810 Acute pulmonary edema: Secondary | ICD-10-CM | POA: Diagnosis not present

## 2022-10-17 DIAGNOSIS — G8929 Other chronic pain: Secondary | ICD-10-CM | POA: Diagnosis present

## 2022-10-17 DIAGNOSIS — R300 Dysuria: Secondary | ICD-10-CM | POA: Diagnosis not present

## 2022-10-17 DIAGNOSIS — K31819 Angiodysplasia of stomach and duodenum without bleeding: Secondary | ICD-10-CM | POA: Diagnosis present

## 2022-10-17 DIAGNOSIS — Z90710 Acquired absence of both cervix and uterus: Secondary | ICD-10-CM

## 2022-10-17 DIAGNOSIS — Z885 Allergy status to narcotic agent status: Secondary | ICD-10-CM

## 2022-10-17 DIAGNOSIS — R0603 Acute respiratory distress: Secondary | ICD-10-CM | POA: Diagnosis not present

## 2022-10-17 DIAGNOSIS — I6389 Other cerebral infarction: Secondary | ICD-10-CM | POA: Diagnosis not present

## 2022-10-17 DIAGNOSIS — Z79899 Other long term (current) drug therapy: Secondary | ICD-10-CM

## 2022-10-17 DIAGNOSIS — R6884 Jaw pain: Secondary | ICD-10-CM | POA: Diagnosis not present

## 2022-10-17 DIAGNOSIS — Z880 Allergy status to penicillin: Secondary | ICD-10-CM

## 2022-10-17 DIAGNOSIS — D6489 Other specified anemias: Secondary | ICD-10-CM | POA: Diagnosis not present

## 2022-10-17 DIAGNOSIS — I2510 Atherosclerotic heart disease of native coronary artery without angina pectoris: Secondary | ICD-10-CM | POA: Diagnosis present

## 2022-10-17 DIAGNOSIS — Z8249 Family history of ischemic heart disease and other diseases of the circulatory system: Secondary | ICD-10-CM

## 2022-10-17 DIAGNOSIS — E8770 Fluid overload, unspecified: Secondary | ICD-10-CM | POA: Diagnosis not present

## 2022-10-17 DIAGNOSIS — N1831 Chronic kidney disease, stage 3a: Secondary | ICD-10-CM | POA: Insufficient documentation

## 2022-10-17 HISTORY — DX: Hypokalemia: E87.6

## 2022-10-17 LAB — CBC WITH DIFFERENTIAL/PLATELET
Abs Immature Granulocytes: 0.06 10*3/uL (ref 0.00–0.07)
Basophils Absolute: 0 10*3/uL (ref 0.0–0.1)
Basophils Relative: 0 %
Eosinophils Absolute: 0 10*3/uL (ref 0.0–0.5)
Eosinophils Relative: 0 %
HCT: 24.5 % — ABNORMAL LOW (ref 36.0–46.0)
Hemoglobin: 8 g/dL — ABNORMAL LOW (ref 12.0–15.0)
Immature Granulocytes: 1 %
Lymphocytes Relative: 10 %
Lymphs Abs: 1.3 10*3/uL (ref 0.7–4.0)
MCH: 30.4 pg (ref 26.0–34.0)
MCHC: 32.7 g/dL (ref 30.0–36.0)
MCV: 93.2 fL (ref 80.0–100.0)
Monocytes Absolute: 0.8 10*3/uL (ref 0.1–1.0)
Monocytes Relative: 7 %
Neutro Abs: 10.5 10*3/uL — ABNORMAL HIGH (ref 1.7–7.7)
Neutrophils Relative %: 82 %
Platelets: 222 10*3/uL (ref 150–400)
RBC: 2.63 MIL/uL — ABNORMAL LOW (ref 3.87–5.11)
RDW: 15.3 % (ref 11.5–15.5)
WBC: 12.7 10*3/uL — ABNORMAL HIGH (ref 4.0–10.5)
nRBC: 0.2 % (ref 0.0–0.2)

## 2022-10-17 LAB — COMPREHENSIVE METABOLIC PANEL
ALT: 12 U/L (ref 0–44)
AST: 14 U/L — ABNORMAL LOW (ref 15–41)
Albumin: 3.2 g/dL — ABNORMAL LOW (ref 3.5–5.0)
Alkaline Phosphatase: 47 U/L (ref 38–126)
Anion gap: 13 (ref 5–15)
BUN: 71 mg/dL — ABNORMAL HIGH (ref 8–23)
CO2: 24 mmol/L (ref 22–32)
Calcium: 8.6 mg/dL — ABNORMAL LOW (ref 8.9–10.3)
Chloride: 96 mmol/L — ABNORMAL LOW (ref 98–111)
Creatinine, Ser: 1.59 mg/dL — ABNORMAL HIGH (ref 0.44–1.00)
GFR, Estimated: 33 mL/min — ABNORMAL LOW (ref >60)
Glucose, Bld: 192 mg/dL — ABNORMAL HIGH (ref 70–99)
Potassium: 2.8 mmol/L — ABNORMAL LOW (ref 3.5–5.1)
Sodium: 133 mmol/L — ABNORMAL LOW (ref 135–145)
Total Bilirubin: 0.5 mg/dL (ref 0.3–1.2)
Total Protein: 6 g/dL — ABNORMAL LOW (ref 6.5–8.1)

## 2022-10-17 LAB — BASIC METABOLIC PANEL
Anion gap: 9 (ref 5–15)
BUN: 69 mg/dL — ABNORMAL HIGH (ref 8–23)
CO2: 28 mmol/L (ref 22–32)
Calcium: 8.5 mg/dL — ABNORMAL LOW (ref 8.9–10.3)
Chloride: 99 mmol/L (ref 98–111)
Creatinine, Ser: 1.63 mg/dL — ABNORMAL HIGH (ref 0.44–1.00)
GFR, Estimated: 32 mL/min — ABNORMAL LOW (ref >60)
Glucose, Bld: 127 mg/dL — ABNORMAL HIGH (ref 70–99)
Potassium: 3.1 mmol/L — ABNORMAL LOW (ref 3.5–5.1)
Sodium: 136 mmol/L (ref 135–145)

## 2022-10-17 LAB — TYPE AND SCREEN

## 2022-10-17 LAB — PROTIME-INR
INR: 1 (ref 0.8–1.2)
Prothrombin Time: 13.1 seconds (ref 11.4–15.2)

## 2022-10-17 LAB — HEMOGLOBIN A1C
Hgb A1c MFr Bld: 7.2 % — ABNORMAL HIGH (ref 4.8–5.6)
Mean Plasma Glucose: 159.94 mg/dL

## 2022-10-17 LAB — LIPASE, BLOOD: Lipase: 36 U/L (ref 11–51)

## 2022-10-17 LAB — GLUCOSE, CAPILLARY
Glucose-Capillary: 412 mg/dL — ABNORMAL HIGH (ref 70–99)
Glucose-Capillary: 199 mg/dL — ABNORMAL HIGH (ref 70–99)
Glucose-Capillary: 40 mg/dL — CL (ref 70–99)
Glucose-Capillary: 78 mg/dL (ref 70–99)

## 2022-10-17 LAB — APTT: aPTT: 25 seconds (ref 24–36)

## 2022-10-17 LAB — MAGNESIUM: Magnesium: 2.4 mg/dL (ref 1.7–2.4)

## 2022-10-17 MED ORDER — BISACODYL 10 MG RE SUPP: 10.0000 mg | Freq: Every day | RECTAL | Status: DC | PRN

## 2022-10-17 MED ORDER — ONDANSETRON HCL 4 MG/2ML IJ SOLN
4.0000 mg | Freq: Four times a day (QID) | INTRAMUSCULAR | Status: DC | PRN
  Administered 2022-10-18 – 2022-10-20 (×2): 4 mg via INTRAVENOUS
  Filled 2022-10-17 (×2): qty 2

## 2022-10-17 MED ORDER — INSULIN ASPART 100 UNIT/ML IJ SOLN
20.0000 [IU] | Freq: Once | INTRAMUSCULAR | Status: AC
  Administered 2022-10-17: 20 [IU] via SUBCUTANEOUS
  Filled 2022-10-17: qty 1

## 2022-10-17 MED ORDER — PANTOPRAZOLE SODIUM 40 MG IV SOLR: 40.0000 mg | Freq: Two times a day (BID) | INTRAVENOUS | Status: DC

## 2022-10-17 MED ORDER — POTASSIUM CHLORIDE 10 MEQ/100ML IV SOLN
10.0000 meq | INTRAVENOUS | Status: AC
  Administered 2022-10-17 (×4): 10 meq via INTRAVENOUS
  Filled 2022-10-17: qty 100

## 2022-10-17 MED ORDER — ACETAMINOPHEN 650 MG RE SUPP: 650.0000 mg | Freq: Four times a day (QID) | RECTAL | Status: DC | PRN

## 2022-10-17 MED ORDER — POTASSIUM CHLORIDE 10 MEQ/100ML IV SOLN: 10.0000 meq | INTRAVENOUS | Status: DC

## 2022-10-17 MED ORDER — POTASSIUM CHLORIDE IN NACL 40-0.9 MEQ/L-% IV SOLN
INTRAVENOUS | Status: DC
  Filled 2022-10-17 (×5): qty 1000

## 2022-10-17 MED ORDER — TRAZODONE HCL 50 MG PO TABS
25.0000 mg | ORAL_TABLET | Freq: Every evening | ORAL | Status: DC | PRN
  Administered 2022-10-18 – 2022-11-01 (×10): 25 mg via ORAL
  Filled 2022-10-17 (×10): qty 1

## 2022-10-17 MED ORDER — ACETAMINOPHEN 325 MG PO TABS
650.0000 mg | ORAL_TABLET | Freq: Four times a day (QID) | ORAL | Status: DC | PRN
  Administered 2022-10-17 – 2022-10-28 (×7): 650 mg via ORAL
  Filled 2022-10-17 (×7): qty 2

## 2022-10-17 MED ORDER — INFLUENZA VAC A&B SA ADJ QUAD 0.5 ML IM PRSY
0.5000 mL | PREFILLED_SYRINGE | INTRAMUSCULAR | Status: AC
  Administered 2022-10-23: 0.5 mL via INTRAMUSCULAR
  Filled 2022-10-17 (×2): qty 0.5

## 2022-10-17 MED ORDER — INSULIN ASPART 100 UNIT/ML IJ SOLN
0.0000 [IU] | Freq: Three times a day (TID) | INTRAMUSCULAR | Status: DC
  Administered 2022-10-18: 3 [IU] via SUBCUTANEOUS
  Administered 2022-10-18: 2 [IU] via SUBCUTANEOUS
  Administered 2022-10-18: 8 [IU] via SUBCUTANEOUS
  Administered 2022-10-19 – 2022-10-20 (×5): 3 [IU] via SUBCUTANEOUS
  Administered 2022-10-20 – 2022-10-21 (×2): 5 [IU] via SUBCUTANEOUS
  Administered 2022-10-21: 2 [IU] via SUBCUTANEOUS
  Administered 2022-10-21: 3 [IU] via SUBCUTANEOUS
  Administered 2022-10-22: 2 [IU] via SUBCUTANEOUS
  Administered 2022-10-22: 3 [IU] via SUBCUTANEOUS
  Administered 2022-10-22 – 2022-10-25 (×5): 2 [IU] via SUBCUTANEOUS
  Administered 2022-10-28: 3 [IU] via SUBCUTANEOUS
  Administered 2022-10-28: 8 [IU] via SUBCUTANEOUS
  Administered 2022-10-28: 15 [IU] via SUBCUTANEOUS
  Administered 2022-10-29: 3 [IU] via SUBCUTANEOUS
  Administered 2022-10-29: 11 [IU] via SUBCUTANEOUS
  Administered 2022-10-29: 5 [IU] via SUBCUTANEOUS
  Administered 2022-10-30: 2 [IU] via SUBCUTANEOUS
  Administered 2022-10-30: 8 [IU] via SUBCUTANEOUS
  Administered 2022-10-30: 3 [IU] via SUBCUTANEOUS
  Administered 2022-10-31: 8 [IU] via SUBCUTANEOUS
  Administered 2022-10-31: 3 [IU] via SUBCUTANEOUS
  Administered 2022-10-31: 15 [IU] via SUBCUTANEOUS
  Administered 2022-11-01: 2 [IU] via SUBCUTANEOUS
  Administered 2022-11-01: 8 [IU] via SUBCUTANEOUS
  Administered 2022-11-01: 5 [IU] via SUBCUTANEOUS
  Administered 2022-11-02: 8 [IU] via SUBCUTANEOUS
  Administered 2022-11-03: 3 [IU] via SUBCUTANEOUS
  Administered 2022-11-03: 8 [IU] via SUBCUTANEOUS
  Administered 2022-11-03: 11 [IU] via SUBCUTANEOUS
  Administered 2022-11-04: 3 [IU] via SUBCUTANEOUS
  Filled 2022-10-17 (×39): qty 1

## 2022-10-17 MED ORDER — ORAL CARE MOUTH RINSE: 15.0000 mL | OROMUCOSAL | Status: DC | PRN

## 2022-10-17 MED ORDER — PANTOPRAZOLE 80MG IVPB - SIMPLE MED
80.0000 mg | Freq: Once | INTRAVENOUS | Status: AC
  Administered 2022-10-17: 80 mg via INTRAVENOUS
  Filled 2022-10-17: qty 100

## 2022-10-17 MED ORDER — SENNA 8.6 MG PO TABS
1.0000 | ORAL_TABLET | Freq: Two times a day (BID) | ORAL | Status: DC
  Administered 2022-10-19 – 2022-11-04 (×27): 8.6 mg via ORAL
  Filled 2022-10-17 (×28): qty 1

## 2022-10-17 MED ORDER — PANTOPRAZOLE INFUSION (NEW) - SIMPLE MED
8.0000 mg/h | INTRAVENOUS | Status: AC
  Administered 2022-10-17 – 2022-10-20 (×7): 8 mg/h via INTRAVENOUS
  Filled 2022-10-17 (×7): qty 100

## 2022-10-17 MED ORDER — POLYETHYLENE GLYCOL 3350 17 G PO PACK
17.0000 g | PACK | Freq: Every day | ORAL | Status: DC | PRN
  Administered 2022-10-29: 17 g via ORAL
  Filled 2022-10-17: qty 1

## 2022-10-17 MED ORDER — ONDANSETRON HCL 4 MG PO TABS: 4.0000 mg | ORAL_TABLET | Freq: Four times a day (QID) | ORAL | Status: DC | PRN

## 2022-10-17 NOTE — Significant Event (Addendum)
Hypoglycemic Event  CBG: 40   Treatment: 8 oz juice/soda  Symptoms: Sweaty, hungry  Follow-up CBG: Time:2308 CBG Result:78  Possible Reasons for Event: Inadequate meal intake  Comments/MD notified:Patient verbalized she feels much better and able to tell that her sugar is coming up. RN reviewed s/s of hyper/hypoglycemia. Patient has a libre monitor on her left upper arm and shared that it is set to notified her if the blood sugar drops below 80. Will continue to round and monitor.    Sanjuana Mae

## 2022-10-17 NOTE — ED Notes (Signed)
See triage note. Labs were sent. Pt to ED with melena since last night and dizziness since 2 days. Hx GI bleed.

## 2022-10-17 NOTE — ED Triage Notes (Signed)
Pt arrives via EMS from home for dark tarry stools x2 days- pt has a hx of GI bleed in 2019- pt is not on blood thinners- pt states pain in her abd started today after her BM

## 2022-10-17 NOTE — ED Provider Notes (Addendum)
[facility] Provider Note    Event Date/Time   First MD Initiated Contact with Patient 10/17/22 1333     (approximate)   History   Melena   HPI  Kelly Rowland is a 81 y.o. female past medical history of coronary artery disease, CKD, COPD, diabetes, hypertension hyperlipidemia who presents with dark stool.  Patient woke up around 3 AM and had a large dark tarry bowel movement.  She has had 2 subsequent episodes last was around 11 AM.  Patient tells me she has a history of GI bleed secondary to gastric ulcer in 2019.  She had previously been taking BC powders for her back took 2 a week ago but is not on it consistently.  Does not take anything for acid production.  She does endorse some mild discomfort in her upper abdomen but not severe.  Denies any hematemesis or vomiting.  She denies shortness of breath or chest pain other than least short-lived episode of chest pain 2 weeks ago.  She also has felt lightheaded upon standing every time she stands up over the last 2 days.  Has not actually syncopized because she can feel that she is going to pass out and sits down.     Past Medical History:  Diagnosis Date   Arthritis    CAD (coronary artery disease)    Chronic kidney disease    COPD (chronic obstructive pulmonary disease) (HCC)    Diabetes mellitus without complication (HCC)    Hyperlipidemia    Hypertension    Restless legs syndrome (RLS)     Patient Active Problem List   Diagnosis Date Noted   Lumbosacral spondylosis without myelopathy 09/16/2022   Spinal stenosis of lumbar region 09/16/2022   Synovitis of ankle 07/02/2022   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   Intermittent claudication (HCC) 02/13/2020   Essential hypertension 12/05/2019   Bilateral carotid artery stenosis 12/26/2018   Diabetes mellitus (HCC) 11/22/2018   Moderate chronic obstructive pulmonary disease (HCC) 07/26/2018    Peripheral arterial disease (HCC) 11/09/2017   Restless legs 11/09/2017   Coronary artery disease 10/06/2017   Mixed hyperlipidemia 08/23/2017   Heart failure, unspecified (HCC) 08/23/2017   Peripheral edema 08/19/2017   Osteopenia 06/26/2015     Physical Exam  Triage Vital Signs: ED Triage Vitals  Enc Vitals Group     BP      Pulse      Resp      Temp      Temp src      SpO2      Weight      Height      Head Circumference      Peak Flow      Pain Score      Pain Loc      Pain Edu?      Excl. in GC?     Most recent vital signs: Vitals:   10/17/22 1339 10/17/22 1400  BP: (!) 101/58 (!) 133/54  Pulse: 70 67  Resp: 12 14  Temp: 98.1 F (36.7 C)   SpO2: 96% 96%     General: Awake, no distress.  CV:  Good peripheral perfusion.  Resp:  Normal effort.  Abd:  No distention.  Mild tenderness in the right upper quadrant but abdomen soft no guarding Neuro:             Awake, Alert, Oriented x 3  Other:  Melena on rectal  exam   ED Results / Procedures / Treatments  Labs (all labs ordered are listed, but only abnormal results are displayed) Labs Reviewed  CBC WITH DIFFERENTIAL/PLATELET - Abnormal; Notable for the following components:      Result Value   WBC 12.7 (*)    RBC 2.63 (*)    Hemoglobin 8.0 (*)    HCT 24.5 (*)    Neutro Abs 10.5 (*)    All other components within normal limits  COMPREHENSIVE METABOLIC PANEL - Abnormal; Notable for the following components:   Sodium 133 (*)    Potassium 2.8 (*)    Chloride 96 (*)    Glucose, Bld 192 (*)    BUN 71 (*)    Creatinine, Ser 1.59 (*)    Calcium 8.6 (*)    Total Protein 6.0 (*)    Albumin 3.2 (*)    AST 14 (*)    GFR, Estimated 33 (*)    All other components within normal limits  LIPASE, BLOOD  PROTIME-INR  APTT  TYPE AND SCREEN  TYPE AND SCREEN     EKG  EKG interpretation performed by myself: NSR, nml axis, nml intervals, no acute ischemic changes    RADIOLOGY   PROCEDURES:  Critical  Care performed: No  .Critical Care  Performed by: Georga Hacking, MD Authorized by: Georga Hacking, MD   Critical care provider statement:    Critical care time (minutes):  30   Critical care was time spent personally by me on the following activities:  Development of treatment plan with patient or surrogate, discussions with consultants, evaluation of patient's response to treatment, examination of patient, ordering and review of laboratory studies, ordering and review of radiographic studies, ordering and performing treatments and interventions, pulse oximetry, re-evaluation of patient's condition and review of old charts   The patient is on the cardiac monitor to evaluate for evidence of arrhythmia and/or significant heart rate changes.   MEDICATIONS ORDERED IN ED: Medications  pantoprozole (PROTONIX) 80 mg /NS 100 mL infusion (8 mg/hr Intravenous New Bag/Given 10/17/22 1446)  pantoprazole (PROTONIX) 80 mg /NS 100 mL IVPB (0 mg Intravenous Stopped 10/17/22 1436)     IMPRESSION / MDM / ASSESSMENT AND PLAN / ED COURSE  I reviewed the triage vital signs and the nursing notes.                              Patient's presentation is most consistent with acute presentation with potential threat to life or bodily function.  Differential diagnosis includes, but is not limited to, peptic ulcer, AVM, Dula Foy's lesion, less likely variceal bleeding  Patient is a 81 year old female history of coronary disease who presents with black tarry stools x1 day mild epigastric pain and presyncope.  She has had 3 episodes of black tarry stools similar to prior episode of GI bleeding in 2019.  She is not on any blood thinners.  Patient's blood pressure is borderline low.  She looks overall nontoxic on exam.  Abdominal exam is benign but on rectal exam she has melena.  We will send blood work including type and screen coags we will start Protonix.  Patient will require admission.  Patient's  hemoglobin is 8.  Was 15.6 in 05/2021 in epic which is the last number we have.  I let Dr. Allegra Lai which I know that the patient.  She will be n.p.o. at midnight for possible EGD in the morning.  Patient's repeat blood pressure is improved.  We will hold off on transfusion but low threshold to transfuse if patient has additional bowel movements in the ED or drops or pressure.   FINAL CLINICAL IMPRESSION(S) / ED DIAGNOSES   Final diagnoses:  UGIB (upper gastrointestinal bleed)     Rx / DC Orders   ED Discharge Orders     None        Note:  This document was prepared using Dragon voice recognition software and may include unintentional dictation errors.   Rada Hay, MD 10/17/22 1406    Rada Hay, MD 10/17/22 [phone]

## 2022-10-17 NOTE — ED Notes (Signed)
Called lab, they are running labs now. Need blue top and redraw T&S.

## 2022-10-17 NOTE — ED Notes (Signed)
Drew blue top and redrew type and screen and sent.

## 2022-10-17 NOTE — H&P (Signed)
History and Physical    Patient: Kelly Rowland VZD:[id number] DOB: Jun 14, 1941 DOA: 10/17/2022 DOS: the patient was seen and examined on 10/17/2022 PCP: Lake Bells, MD  Patient coming from: Home  Chief Complaint:  Chief Complaint  Patient presents with   Melena   HPI: Kelly Rowland is a 81 y.o. female with medical history significant of DJD, CAD, chronic kidney disease, type II diabetes, history of peptic ulcer disease, hypertension comes emergency room with generalized weakness/dizziness and black tarry stools for last two days. Patient states she took [facility] powder last week for her back pain. She denies any vomiting or any bright red blood per rectum. Patient was found to have hemoglobin of 8.0. Her hemoglobin at baseline is 15.0 in 2022.  ER physician informed Dr. Allegra Lai. Patient is supposed to be NPO after midnight for possible G.I. evaluation. Currently IV fluids and IV Protonix be started. Patient is being admitted for G.I. bleed.   Review of Systems: As mentioned in the history of present illness. All other systems reviewed and are negative. Past Medical History:  Diagnosis Date   Arthritis    CAD (coronary artery disease)    Chronic kidney disease    COPD (chronic obstructive pulmonary disease) (HCC)    Diabetes mellitus without complication (HCC)    Hyperlipidemia    Hypertension    Restless legs syndrome (RLS)    Past Surgical History:  Procedure Laterality Date   ABDOMINAL HYSTERECTOMY     APPENDECTOMY     CATARACT EXTRACTION Bilateral 2019   TONSILLECTOMY     Social History:  reports that she quit smoking about 5 years ago. Her smoking use included cigarettes. She has a 50.00 pack-year smoking history. She has never used smokeless tobacco. She reports that she does not drink alcohol and does not use drugs.  Allergies  Allergen Reactions   Amoxicillin Anaphylaxis   Lisinopril Anaphylaxis   Tramadol Nausea Only   Gabapentin Other (See Comments)    Hands jerking     Family History  Problem Relation Age of Onset   Hypertension Mother    Heart disease Mother    Diabetes Mother    Hypertension Father    Heart disease Father     Prior to Admission medications   Medication Sig Start Date End Date Taking? Authorizing Provider  amLODipine (NORVASC) 5 MG tablet Take 5 mg by mouth daily. 03/01/17   [provider]  aspirin 81 MG EC tablet Take 81 mg by mouth daily.    [provider]  carvedilol (COREG) 25 MG tablet Take 25 mg by mouth 2 (two) times daily. 11/13/21   [provider]  Cholecalciferol 50 MCG (2000 UT) CAPS Take 1 capsule by mouth daily.    [provider]  colchicine 0.6 MG tablet colchicine 0.6 mg tablet  2 TABS AT ONSET. THEN 1 TAB 2 HRS LATER IF NEEDED. Patient not taking: Reported on 12/11/2021 01/17/20   [provider]  fluticasone-salmeterol (ADVAIR) 250-50 MCG/ACT AEPB Inhale 1 puff into the lungs 2 (two) times daily. 11/03/21   [provider]  folic acid (FOLVITE) 400 MCG tablet Take 1 tablet by mouth daily.    [provider]  furosemide (LASIX) 20 MG tablet Take 20-40 mg by mouth daily. 11/13/21   [provider]  glimepiride (AMARYL) 4 MG tablet Take 4 mg by mouth daily. 09/29/21   [provider]  KLOR-CON M20 20 MEQ tablet Take 20 mEq by mouth daily. 09/25/21  [provider]  nitroGLYCERIN (NITROSTAT) 0.4 MG SL tablet SMARTSIG:1 Tablet(s) Sublingual Patient not taking: Reported on 12/11/2021 08/22/21   [provider]  NOVOLIN N 100 UNIT/ML injection Inject 24 Units into the skin daily. 12/02/21   [provider]  oxyCODONE-acetaminophen (PERCOCET/ROXICET) 5-325 MG tablet Take 1-2 tablets by mouth every 6 (six) hours as needed for pain. 02/15/17   [provider]  simvastatin (ZOCOR) 20 MG tablet Take 20 mg by mouth at bedtime. 01/30/17   [provider]  triamterene-hydrochlorothiazide (MAXZIDE-25) 37.5-25  MG tablet Take 1 tablet by mouth daily. 06/05/18   [provider]  VENTOLIN HFA 108 (90 Base) MCG/ACT inhaler Inhale 2 puffs into the lungs every 4 (four) hours as needed for shortness of breath, wheezing or cough. 01/11/17   [provider]  vitamin B-12 (CYANOCOBALAMIN) 500 MCG tablet Take 1 tablet by mouth daily.    [provider]    Physical Exam: Vitals:   10/17/22 1333 10/17/22 1339 10/17/22 1400  BP:  (!) 101/58 (!) 133/54  Pulse:  70 67  Resp:  12 14  Temp:  98.1 F (36.7 C)   TempSrc:  Oral   SpO2:  96% 96%  Weight: 65.3 kg    Height: 5\' 2"  (1.575 m)    Physical Exam Constitutional:      Appearance: Normal appearance.  HENT:     Head: Normocephalic and atraumatic.  Eyes:     Pupils: Pupils are equal, round, and reactive to light.  Cardiovascular:     Rate and Rhythm: Normal rate and regular rhythm.     Pulses: Normal pulses.     Heart sounds: Normal heart sounds.  Pulmonary:     Effort: Pulmonary effort is normal.     Breath sounds: Normal breath sounds.  Abdominal:     General: Abdomen is flat. Bowel sounds are normal.     Palpations: Abdomen is soft.  Musculoskeletal:        General: Normal range of motion.     Cervical back: Normal range of motion.  Skin:    General: Skin is warm and dry.  Neurological:     Mental Status: She is alert.     Assessment and Plan: Vertie Kelly Rowland is a 81 y.o. female with medical history significant of DJD, CAD, chronic kidney disease, type II diabetes, history of peptic ulcer disease, hypertension comes emergency room with generalized weakness/dizziness and black tarry stools for last two days. Patient states she took [facility] powder last week for her back pain. She denies any vomiting or any bright red blood per rectum. Patient was found to have hemoglobin of 8.0. Her hemoglobin at baseline is 15.0 in 2022.  [name] -- patient presents with black tarry stools times three since yesterday. -- As  history of peptic ulcer disease -- took [facility] powder is last week for back pain -- IV Protonix, IV fluids -- came in with hemoglobin of 8.0 -- baseline hemoglobin 15.0 in June 2022 -- EGD December 2019    - Normal esophagus.                        - Gastritis with hemorrhage. Biopsied.                        - Normal examined duodenum.  --Colonoscopy December 2019- One 7 mm polyp in the ascending colon, removed with a  cold snare. Resected and retrieved.                        - Diverticulosis in the sigmoid colon.                        - Internal hemorrhoids.  -- Transfuse as needed  Hypokalemia Hyponatremia -- pharmacy to replace potassium  CKD stage III -- baseline creatinine 1.4-- 1.7 -- creatinine 1.5 -- continue to monitor  CAD -- denies chest pain, stable  History of hypertension -- will hold BP meds for now... Pressure stable     Advance Care Planning: FULL CODE per patient Consults: G.I. Dr. Allegra Lai  Family Communication: sister at bedside  Severity of Illness: The appropriate patient status for this patient is INPATIENT. Inpatient status is judged to be reasonable and necessary in order to provide the required intensity of service to ensure the patient's safety. The patient's presenting symptoms, physical exam findings, and initial radiographic and laboratory data in the context of their chronic comorbidities is felt to place them at high risk for further clinical deterioration. Furthermore, it is not anticipated that the patient will be medically stable for discharge from the hospital within 2 midnights of admission.   * I certify that at the point of admission it is my clinical judgment that the patient will require inpatient hospital care spanning beyond 2 midnights from the point of admission due to high intensity of service, high risk for further deterioration and high frequency of surveillance required.*  Author: Enedina Finner, MD 10/17/2022  3:17 PM  For on call review [email or web address].

## 2022-10-17 NOTE — ED Notes (Signed)
Repositioned in bed. Dr Posey Pronto at bedside. Pt 95% on room air.

## 2022-10-17 NOTE — Consult Note (Addendum)
PHARMACY CONSULT NOTE - FOLLOW UP  Pharmacy Consult for Electrolyte Monitoring and Replacement   Recent Labs: Potassium (mmol/L)  Date Value  10/17/2022 3.1 (L)   Magnesium (mg/dL)  Date Value  10/17/2022 2.4   Calcium (mg/dL)  Date Value  10/17/2022 8.5 (L)   Albumin (g/dL)  Date Value  10/17/2022 3.2 (L)   Sodium (mmol/L)  Date Value  10/17/2022 136     Assessment: 81yo F w/ h/o CAD, CKD, COPD, DM, HTN, & HLD who presents with dark stool. At this time unable to use gut for repletion, will utilize IV instead. Pharmacy consulted for mgmt of electrolytes.  Goal of Therapy:  Lytes WNL  Plan:  Scr 1.59 (BL unclears; was 1.77-1.79 in 2021 & 2022) K 3.1 - prior order of KCL IV 52meq q1h x4 doses still in progress Recheck level w/ AM labs CTM and replace PRN. Ordered labs for AM  Renda Rolls, PharmD, [facility] 10/17/2022 10:59 PM

## 2022-10-17 NOTE — Consult Note (Signed)
PHARMACY CONSULT NOTE - FOLLOW UP  Pharmacy Consult for Electrolyte Monitoring and Replacement   Recent Labs: Potassium (mmol/L)  Date Value  10/17/2022 2.8 (L)   Calcium (mg/dL)  Date Value  10/17/2022 8.6 (L)   Albumin (g/dL)  Date Value  10/17/2022 3.2 (L)   Sodium (mmol/L)  Date Value  10/17/2022 133 (L)     Assessment: 81yo F w/ h/o CAD, CKD, COPD, DM, HTN, & HLD who presents with dark stool. At this time unable to use gut for repletion, will utilize IV instead. Pharmacy consulted for mgmt of electrolytes.  Goal of Therapy:  Lytes WNL  Plan:  Scr 1.59 (BL unclears; was 1.77-1.79 in 2021 & 2022) K 2.8 - replacing with KCL IV 74meq q1h x4 doses Recheck level ~1hr after last infusion to reassess further dosing. Mg unk - Will do add-on level to allow repletion at next interval check this evening. CTM and replace PRN. Ordered labs for AM  Lorna Dibble ,PharmD Clinical Pharmacist 10/17/2022 3:22 PM

## 2022-10-18 ENCOUNTER — Inpatient Hospital Stay: Payer: Medicare Other | Admitting: Anesthesiology

## 2022-10-18 ENCOUNTER — Encounter
Admission: EM | Disposition: A | Discharge status: Skilled Nursing Facility | Payer: Self-pay | Source: Home / Self Care | Attending: Internal Medicine

## 2022-10-18 DIAGNOSIS — K922 Gastrointestinal hemorrhage, unspecified: Secondary | ICD-10-CM

## 2022-10-18 HISTORY — PX: ESOPHAGOGASTRODUODENOSCOPY (EGD) WITH PROPOFOL: SHX5813

## 2022-10-18 HISTORY — DX: Gastrointestinal hemorrhage, unspecified: K92.2

## 2022-10-18 LAB — CBC
HCT: 21.8 % — ABNORMAL LOW (ref 36.0–46.0)
Hemoglobin: 7.2 g/dL — ABNORMAL LOW (ref 12.0–15.0)
MCH: 31.2 pg (ref 26.0–34.0)
MCHC: 33 g/dL (ref 30.0–36.0)
MCV: 94.4 fL (ref 80.0–100.0)
Platelets: 200 10*3/uL (ref 150–400)
RBC: 2.31 MIL/uL — ABNORMAL LOW (ref 3.87–5.11)
RDW: 15.6 % — ABNORMAL HIGH (ref 11.5–15.5)
WBC: 17.8 10*3/uL — ABNORMAL HIGH (ref 4.0–10.5)
nRBC: 0 % (ref 0.0–0.2)

## 2022-10-18 LAB — GLUCOSE, CAPILLARY
Glucose-Capillary: 193 mg/dL — ABNORMAL HIGH (ref 70–99)
Glucose-Capillary: 255 mg/dL — ABNORMAL HIGH (ref 70–99)
Glucose-Capillary: 138 mg/dL — ABNORMAL HIGH (ref 70–99)
Glucose-Capillary: 190 mg/dL — ABNORMAL HIGH (ref 70–99)

## 2022-10-18 LAB — BASIC METABOLIC PANEL
Anion gap: 8 (ref 5–15)
Anion gap: 8 (ref 5–15)
BUN: 59 mg/dL — ABNORMAL HIGH (ref 8–23)
BUN: 53 mg/dL — ABNORMAL HIGH (ref 8–23)
CO2: 25 mmol/L (ref 22–32)
CO2: 25 mmol/L (ref 22–32)
Calcium: 8.4 mg/dL — ABNORMAL LOW (ref 8.9–10.3)
Calcium: 8.5 mg/dL — ABNORMAL LOW (ref 8.9–10.3)
Chloride: 105 mmol/L (ref 98–111)
Chloride: 105 mmol/L (ref 98–111)
Creatinine, Ser: 1.41 mg/dL — ABNORMAL HIGH (ref 0.44–1.00)
Creatinine, Ser: 1.58 mg/dL — ABNORMAL HIGH (ref 0.44–1.00)
GFR, Estimated: 38 mL/min — ABNORMAL LOW (ref >60)
GFR, Estimated: 33 mL/min — ABNORMAL LOW (ref >60)
Glucose, Bld: 128 mg/dL — ABNORMAL HIGH (ref 70–99)
Glucose, Bld: 185 mg/dL — ABNORMAL HIGH (ref 70–99)
Potassium: 3.8 mmol/L (ref 3.5–5.1)
Potassium: 4.1 mmol/L (ref 3.5–5.1)
Sodium: 138 mmol/L (ref 135–145)
Sodium: 138 mmol/L (ref 135–145)

## 2022-10-18 LAB — URINALYSIS, COMPLETE (UACMP) WITH MICROSCOPIC
Bilirubin Urine: NEGATIVE
Glucose, UA: NEGATIVE mg/dL
Ketones, ur: NEGATIVE mg/dL
Nitrite: NEGATIVE
Protein, ur: NEGATIVE mg/dL
Specific Gravity, Urine: 1.01 (ref 1.005–1.030)
pH: 9 — ABNORMAL HIGH (ref 5.0–8.0)

## 2022-10-18 LAB — IRON AND TIBC
Iron: 51 ug/dL (ref 28–170)
Saturation Ratios: 13 % (ref 10.4–31.8)
TIBC: 406 ug/dL (ref 250–450)
UIBC: 355 ug/dL

## 2022-10-18 LAB — FOLATE: Folate: 34 ng/mL (ref >5.9)

## 2022-10-18 LAB — HEMOGLOBIN AND HEMATOCRIT, BLOOD
HCT: 24 % — ABNORMAL LOW (ref 36.0–46.0)
Hemoglobin: 7.9 g/dL — ABNORMAL LOW (ref 12.0–15.0)

## 2022-10-18 LAB — VITAMIN B12: Vitamin B-12: 1172 pg/mL — ABNORMAL HIGH (ref 180–914)

## 2022-10-18 LAB — FERRITIN: Ferritin: 51 ng/mL (ref 11–307)

## 2022-10-18 LAB — PHOSPHORUS: Phosphorus: 2.2 mg/dL — ABNORMAL LOW (ref 2.5–4.6)

## 2022-10-18 LAB — ABO/RH: ABO/RH(D): B NEG

## 2022-10-18 LAB — PREPARE RBC (CROSSMATCH)

## 2022-10-18 LAB — MAGNESIUM: Magnesium: 2.2 mg/dL (ref 1.7–2.4)

## 2022-10-18 SURGERY — ESOPHAGOGASTRODUODENOSCOPY (EGD) WITH PROPOFOL
Anesthesia: General

## 2022-10-18 MED ORDER — OXYCODONE-ACETAMINOPHEN 5-325 MG PO TABS
1.0000 | ORAL_TABLET | Freq: Four times a day (QID) | ORAL | Status: DC | PRN
  Administered 2022-10-18 – 2022-10-19 (×5): 1 via ORAL
  Administered 2022-10-20 – 2022-10-21 (×4): 2 via ORAL
  Administered 2022-10-21: 1 via ORAL
  Filled 2022-10-18 (×2): qty 1
  Filled 2022-10-18: qty 2
  Filled 2022-10-18: qty 1
  Filled 2022-10-18: qty 2
  Filled 2022-10-18 (×2): qty 1
  Filled 2022-10-18: qty 2
  Filled 2022-10-18 (×3): qty 1

## 2022-10-18 MED ORDER — LIDOCAINE 2% (20 MG/ML) 5 ML SYRINGE
INTRAMUSCULAR | Status: DC | PRN
  Administered 2022-10-18: 100 mg via INTRAVENOUS

## 2022-10-18 MED ORDER — SODIUM CHLORIDE 0.9% IV SOLUTION: Freq: Once | INTRAVENOUS | Status: DC

## 2022-10-18 MED ORDER — PROPOFOL 10 MG/ML IV BOLUS
INTRAVENOUS | Status: DC | PRN
  Administered 2022-10-18: 70 mg via INTRAVENOUS

## 2022-10-18 MED ORDER — HYDRALAZINE HCL 20 MG/ML IJ SOLN
10.0000 mg | Freq: Four times a day (QID) | INTRAMUSCULAR | Status: DC | PRN
  Administered 2022-10-29: 10 mg via INTRAVENOUS
  Filled 2022-10-18: qty 1

## 2022-10-18 MED ORDER — SODIUM CHLORIDE 0.9 % IV SOLN
INTRAVENOUS | Status: DC
  Administered 2022-10-18: 20 mL/h via INTRAVENOUS

## 2022-10-18 MED ORDER — PROPOFOL 500 MG/50ML IV EMUL
INTRAVENOUS | Status: DC | PRN
  Administered 2022-10-18: 150 ug/kg/min via INTRAVENOUS

## 2022-10-18 MED ORDER — ESMOLOL HCL 100 MG/10ML IV SOLN
INTRAVENOUS | Status: DC | PRN
  Administered 2022-10-18: 10 mg via INTRAVENOUS

## 2022-10-18 MED ORDER — EPINEPHRINE 1 MG/10ML IJ SOSY
PREFILLED_SYRINGE | INTRAMUSCULAR | Status: DC | PRN
  Administered 2022-10-18: .3 mg via INTRAVENOUS

## 2022-10-18 MED ORDER — SODIUM CHLORIDE 0.9 % IV SOLN: INTRAVENOUS | Status: DC

## 2022-10-18 MED ORDER — LIDOCAINE HCL (PF) 2 % IJ SOLN
INTRAMUSCULAR | Status: AC
  Filled 2022-10-18: qty 5

## 2022-10-18 MED ORDER — POTASSIUM CHLORIDE IN NACL 20-0.9 MEQ/L-% IV SOLN
INTRAVENOUS | Status: DC
  Filled 2022-10-18 (×2): qty 1000

## 2022-10-18 MED ORDER — MORPHINE SULFATE (PF) 2 MG/ML IV SOLN
1.0000 mg | Freq: Once | INTRAVENOUS | Status: AC
  Administered 2022-10-18: 1 mg via INTRAVENOUS
  Filled 2022-10-18: qty 1

## 2022-10-18 MED ORDER — POTASSIUM PHOSPHATES 15 MMOLE/5ML IV SOLN
15.0000 mmol | Freq: Once | INTRAVENOUS | Status: AC
  Administered 2022-10-18: 15 mmol via INTRAVENOUS
  Filled 2022-10-18: qty 5

## 2022-10-18 NOTE — Op Note (Signed)
[facility] Gastroenterology Patient Name: Kelly Rowland Procedure Date: 10/18/2022 2:03 PM MRN: [id number] Account #: [id number] Date of Birth: 04-22-1941 Admit Type: Outpatient Age: 81 Room: [facility] ENDO ROOM 4 Gender: Female Note Status: Finalized Instrument Name: Upper Endoscope [id number] Procedure:             Upper GI endoscopy Indications:           Epigastric abdominal pain, Unexplained iron deficiency                         anemia, Melena Providers:             Lin Landsman MD, MD Medicines:             General Anesthesia Complications:         No immediate complications. Estimated blood loss:                         Minimal. Procedure:             Pre-Anesthesia Assessment:                        - Prior to the procedure, a History and Physical was                         performed, and patient medications and allergies were                         reviewed. The patient is competent. The risks and                         benefits of the procedure and the sedation options and                         risks were discussed with the patient. All questions                         were answered and informed consent was obtained.                         Patient identification and proposed procedure were                         verified by the physician, the nurse, the                         anesthesiologist, the anesthetist and the technician                         in the pre-procedure area in the procedure room in the                         endoscopy suite. Mental Status Examination: alert and                         oriented. Airway Examination: normal oropharyngeal                         airway and neck mobility. Respiratory Examination:  clear to auscultation. CV Examination: normal.                         Prophylactic Antibiotics: The patient does not require                         prophylactic antibiotics. Prior  Anticoagulants: The                         patient has taken no anticoagulant or antiplatelet                         agents. ASA Grade Assessment: III - A patient with                         severe systemic disease. After reviewing the risks and                         benefits, the patient was deemed in satisfactory                         condition to undergo the procedure. The anesthesia                         plan was to use general anesthesia. Immediately prior                         to administration of medications, the patient was                         re-assessed for adequacy to receive sedatives. The                         heart rate, respiratory rate, oxygen saturations,                         blood pressure, adequacy of pulmonary ventilation, and                         response to care were monitored throughout the                         procedure. The physical status of the patient was                         re-assessed after the procedure.                        After obtaining informed consent, the endoscope was                         passed under direct vision. Throughout the procedure,                         the patient's blood pressure, pulse, and oxygen                         saturations were monitored continuously. The  Endosonoscope was introduced through the mouth, and                         advanced to the second part of duodenum. The upper GI                         endoscopy was accomplished without difficulty. The                         patient tolerated the procedure well. Findings:      Localized severely friable mucosa with active bleeding was found in the       second portion of the duodenum along the sweep, very difficult location       to maintain a stable position with the scope due to looping. Area was       successfully injected with 3 mL of a 0.1 mg/mL solution of epinephrine       for hemostasis. Fulguration to  stop the bleeding by bipolar probe was       successful. Estimated blood loss was minimal. No descrete mass or ulcer       was identified at this area.      The entire examined stomach was normal.      The cardia and gastric fundus were normal on retroflexion.      The gastroesophageal junction and examined esophagus were normal. Impression:            - Bleeding friable duodenal mucosa. Injected. Treated                         with bipolar cautery.                        - Normal stomach.                        - Normal gastroesophageal junction and esophagus.                        - No specimens collected. Recommendation:        - Return patient to hospital ward for ongoing care.                        - Clear liquid diet today.                        - Perform CT scan (computed tomography) of the abdomen                         and pelvis with contrast to evaluate any pancreas                         lesions abutting/invading duodenum.                        - Give Protonix (pantoprazole): 8 mg/hr IV by                         continuous infusion for 3 days. Procedure Code(s):     --- Professional ---  43255, Esophagogastroduodenoscopy, flexible,                         transoral; with control of bleeding, any method Diagnosis Code(s):     --- Professional ---                        K92.2, Gastrointestinal hemorrhage, unspecified                        R10.13, Epigastric pain                        D50.9, Iron deficiency anemia, unspecified                        K92.1, Melena (includes Hematochezia) CPT copyright 2022 American Medical Association. All rights reserved. The codes documented in this report are preliminary and upon coder review may  be revised to meet current compliance requirements. Dr. Libby Maw Toney Reil MD, MD 10/18/2022 3:02:14 PM This report has been signed electronically. Number of Addenda: 0 Note Initiated On: 10/18/2022  2:03 PM Estimated Blood Loss:  Estimated blood loss was minimal.      [facility]

## 2022-10-18 NOTE — Consult Note (Signed)
PHARMACY CONSULT NOTE - FOLLOW UP  Pharmacy Consult for Electrolyte Monitoring and Replacement   Recent Labs: Potassium (mmol/L)  Date Value  10/18/2022 3.8   Magnesium (mg/dL)  Date Value  10/18/2022 2.2   Calcium (mg/dL)  Date Value  10/18/2022 8.4 (L)   Albumin (g/dL)  Date Value  10/17/2022 3.2 (L)   Phosphorus (mg/dL)  Date Value  10/18/2022 2.2 (L)   Sodium (mmol/L)  Date Value  10/18/2022 138     Assessment: 81yo F w/ h/o CAD, CKD, COPD, DM, HTN, & HLD who presents with dark stool. At this time unable to use gut for repletion, will utilize IV instead. Pharmacy consulted for mgmt of electrolytes.  Goal of Therapy:  [place]ytes WNL  Plan:  K 3.8  Mag 2.2  Phos 2.2  Scr 1.41 Scr 1.41 (BL unclear; was 1.77-1.79 in 2021 & 2022) -Currently on IVF NS w/ KCL 40 meq/L @ 100 ml/hr  Will order Potassium Phosphate 15 mmol IV x 1 Will adjust IVF to NS w/ KCL 20 meq/L @ 112ml/hr for now CTM and replace PRN. F/u labs in  AM  Chinita Greenland PharmD Clinical Pharmacist 10/18/2022

## 2022-10-18 NOTE — Progress Notes (Signed)
[facility]  - [place] at [facility]   PATIENT NAME: Kelly Rowland    MR#:  [id number]  DATE OF BIRTH:  1940-12-16  SUBJECTIVE:  patient came in after she had tarry stool at home. Hemoglobin drop down to 7.2. Denies any bright red blood per rectum. Waiting for G.I. to see patient. No family at bedside today  VITALS:  Blood pressure (!) 141/47, pulse 74, temperature 98.4 F (36.9 C), temperature source Oral, resp. rate 14, height 5\' 2"  (1.575 m), weight 65.3 kg, SpO2 91 %.  PHYSICAL EXAMINATION:   GENERAL:  81 y.o.-year-old patient lying in the bed with no acute distress.  LUNGS: Normal breath sounds bilaterally, no wheezing CARDIOVASCULAR: S1, S2 normal. No murmurs,   ABDOMEN: Soft, nontender, nondistended. Bowel sounds present.  EXTREMITIES: No  edema b/l.    NEUROLOGIC: nonfocal  patient is alert and awake SKIN: No obvious rash, lesion, or ulcer.   LABORATORY PANEL:  CBC Recent Labs  Lab 10/18/22 0435  WBC 17.8*  HGB 7.2*  HCT 21.8*  PLT 200    Chemistries  Recent Labs  Lab 10/17/22 1352 10/17/22 2142 10/18/22 0435 10/18/22 0726  NA 133*   < > 138 138  K 2.8*   < > 3.8 4.1  CL 96*   < > 105 105  CO2 24   < > 25 25  GLUCOSE 192*   < > 128* 185*  BUN 71*   < > 59* 53*  CREATININE 1.59*   < > 1.41* 1.58*  CALCIUM 8.6*   < > 8.4* 8.5*  MG 2.4  --  2.2  --   AST 14*  --   --   --   ALT 12  --   --   --   ALKPHOS 47  --   --   --   BILITOT 0.5  --   --   --    < > = values in this interval not displayed.   Assessment and Plan  Kelly Rowland is a 81 y.o. female with medical history significant of DJD, CAD, chronic kidney disease, type II diabetes, history of peptic ulcer disease, hypertension comes emergency room with generalized weakness/dizziness and black tarry stools for last two days. Patient states she took [facility] powder last week for her back pain. She denies any vomiting or any bright red blood per rectum. Patient was found to have hemoglobin  of 8.0. Her hemoglobin at baseline is 15.0 in 2022.   [name] -- patient presents with black tarry stools times three since yesterday. -- As history of peptic ulcer disease -- took [facility] powder is last week for back pain -- IV Protonix, IV fluids -- came in with hemoglobin of 8.0--7.2 -- baseline hemoglobin 15.0 in June 2022 -- EGD December 2019    - Normal esophagus.                        - Gastritis with hemorrhage. Biopsied.                        - Normal examined duodenum.  --Colonoscopy December 2019- One 7 mm polyp in the ascending colon, removed with a                        cold snare. Resected and retrieved.                        -  Diverticulosis in the sigmoid colon.                        - Internal hemorrhoids.  -- Transfuse as needed --GI to see pt for EGD today--keep NPO   Hypokalemia Hyponatremia -- pharmacy to replace potassium --resolved   CKD stage III -- baseline creatinine 1.4-- 1.7 -- creatinine 1.5 -- continue to monitor   CAD -- denies chest pain, stable   History of hypertension -- will hold BP meds for now... Pressure stable        Advance Care Planning: FULL CODE per patient Consults: G.I. Dr. Marius Ditch  Family Communication:none today Procedures: DVT Prophylaxis :SCD Level of care: Med-Surg Status is: Inpatient Remains inpatient appropriate because: pending GI consult    TOTAL TIME TAKING CARE OF THIS PATIENT: 35 minutes.  >50% time spent on counselling and coordination of care  Note: This dictation was prepared with Dragon dictation along with smaller phrase technology. Any transcriptional errors that result from this process are unintentional.  Fritzi Mandes M.D    [facility]   CC: Primary care physician; Volanda Napoleon, MD

## 2022-10-18 NOTE — TOC Initial Note (Signed)
Transition of Care [facility]) - Initial/Assessment Note    Patient Details  Name: Kelly Rowland MRN: [id number] Date of Birth: 1941/03/20  Transition of Care [facility]) CM/SW Contact:    Liliana Cline, LCSW Phone Number: 10/18/2022, 12:17 PM  Clinical Narrative:                 CSW spoke to patient regarding HH recommendations. Patient lives alone and drives herself to appointments. PCP is Dr. Verlon Setting. Pharmacy is [facility].  Patient has a rollator and cane at home. Confirmed home address with patient.  No HH history. Patient is agreeable to [facility]. Referral accepted by [name] with [facility].    Expected Discharge Plan: Home w Home Health Services Barriers to Discharge: Continued Medical Work up   Patient Goals and CMS Choice Patient states their goals for this hospitalization and ongoing recovery are:: home with home health CMS Medicare.gov Compare Post Acute Care list provided to:: Patient Choice offered to / list presented to : Patient  Expected Discharge Plan and Services Expected Discharge Plan: Home w Home Health Services       Living arrangements for the past 2 months: Single Family Home                           HH Arranged: PT HH Agency: [facility] ([facility]) Date HH Agency Contacted: 10/18/22   Representative spoke with at [facility] Agency: [name]  Prior Living Arrangements/Services Living arrangements for the past 2 months: Single Family Home Lives with:: Self Patient language and need for interpreter reviewed:: Yes Do you feel safe going back to the place where you live?: Yes      Need for Family Participation in Patient Care: Yes (Comment) Care giver support system in place?: Yes (comment) Current home services: DME Criminal Activity/Legal Involvement Pertinent to Current Situation/Hospitalization: No - Comment as needed  Activities of Daily Living Home Assistive Devices/Equipment: Environmental consultant (specify type), Other (Comment) (libre) ADL Screening (condition  at time of admission) Patient's cognitive ability adequate to safely complete daily activities?: Yes Is the patient deaf or have difficulty hearing?: No Does the patient have difficulty seeing, even when wearing glasses/contacts?: No Does the patient have difficulty concentrating, remembering, or making decisions?: No Patient able to express need for assistance with ADLs?: Yes Does the patient have difficulty dressing or bathing?: No Independently performs ADLs?: Yes (appropriate for developmental age) Does the patient have difficulty walking or climbing stairs?: No Weakness of Legs: None Weakness of Arms/Hands: None  Permission Sought/Granted Permission sought to share information with : Facility Industrial/product designer granted to share information with : Yes, Verbal Permission Granted     Permission granted to share info w AGENCY: HH agencies        Emotional Assessment       Orientation: : Oriented to Self, Oriented to Place, Oriented to  Time, Oriented to Situation Alcohol / Substance Use: Not Applicable Psych Involvement: No (comment)  Admission diagnosis:  GI bleed [K92.2] UGIB (upper gastrointestinal bleed) [K92.2] Patient Active Problem List   Diagnosis Date Noted   GI bleed 10/17/2022   Hypokalemia 10/17/2022   Normocytic anemia 10/17/2022   Lumbosacral spondylosis without myelopathy 09/16/2022   Spinal stenosis of lumbar region 09/16/2022   Synovitis of ankle 07/02/2022   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   Intermittent claudication (HCC) 02/13/2020   Essential hypertension 12/05/2019   Bilateral carotid artery stenosis 12/26/2018  Diabetes mellitus ([place]) 11/22/2018   Moderate chronic obstructive pulmonary disease ([place]) 07/26/2018   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

## 2022-10-18 NOTE — Consult Note (Addendum)
Cephas Darby, MD [address]  [place]  [place], [place] [address]  Main: [phone]  Fax: [phone] Pager: [phone]   Consultation  Referring Provider:     No ref. provider found Primary Care Physician:  Volanda Napoleon, MD Primary Gastroenterologist: Althia Forts         Reason for Consultation: Melena  Date of Admission:  10/17/2022 Date of Consultation:  10/18/2022         HPI:   Kelly Rowland is a 81 y.o. female with history of nonobstructive coronary artery disease, preserved EF, history of remote MI, on aspirin 81 mg, peripheral artery disease, carotid artery stenosis, COPD, diabetes, hypertension, CKD presented with 2 days history of black tarry stools.  Patient reports history of GI bleed in 2019, for which she underwent upper endoscopy as well as colonoscopy at [facility] which revealed area of friable mucosa in the gastric antrum.  She stated that she took 1 dose of BC powder for back pain 2 weeks ago.  Not on any acid suppressive medication.  She does report mild upper abdominal discomfort, denies any nausea, vomiting, hematemesis or coffee-ground emesis.  She denies any chest pain, shortness of breath.  She does report feeling lightheaded, denies any syncopal episodes.  Patient has been hemodynamically stable on arrival and since admission, hemoglobin was 8, normal MCV, normal serum lipase, HbA1c 7.2, BUN/creatinine 71/1.59.  Patient's hemoglobin further dropped to 7.2 this morning.  Last hemoglobin recorded was 15.6 on 05/27/2021 at [facility].  Patient is found to have black tarry stools on exam, started on pantoprazole drip, kept n.p.o. and GI is consulted for further evaluation.  Apparently, patient had broth, Jell-O and juice at 9 AM today even though she was made NPO.  NSAIDs: BC powder use for back pain  Antiplts/Anticoagulants/Anti thrombotics: None  GI Procedures:  EGD and colonoscopy 11/18/2018 Findings:      The esophagus was normal.       Diffuse moderate  inflammation with hemorrhage characterized by adherent       blood, erosions, friability and mucus was found in the gastric antrum.       Biopsies were taken with a cold forceps for histology.       The examined duodenum was normal.     The perianal and digital rectal examinations were normal.       A 7 mm polyp was found in the ascending colon. The polyp was sessile.       The polyp was removed with a cold snare. Resection and retrieval were       complete.       Multiple small and large-mouthed diverticula were found in the sigmoid       colon.       Internal hemorrhoids were found during retroflexion. The hemorrhoids       were large.   Diagnosis   A.  Stomach, antrum and body, biopsy: -Antral and oxyntic mucosa with mild chronic inactive gastritis. -Negative for Helicobacter pylori organisms on H&E stain.   B.  Colon, ascending, polyp: -Fragments of sessile serrated adenoma.    Past Medical History:  Diagnosis Date   Arthritis    CAD (coronary artery disease)    Chronic kidney disease    COPD (chronic obstructive pulmonary disease) (HCC)    Diabetes mellitus without complication (HCC)    Hyperlipidemia    Hypertension    Restless legs syndrome (RLS)     Past Surgical History:  Procedure Laterality  Date   ABDOMINAL HYSTERECTOMY     APPENDECTOMY     CATARACT EXTRACTION Bilateral 2019   TONSILLECTOMY       Current Facility-Administered Medications:    [MAR Hold] 0.9 %  sodium chloride infusion (Manually program via Guardrails IV Fluids), , Intravenous, Once, Fritzi Mandes, MD   0.9 %  sodium chloride infusion, , Intravenous, Continuous, Brainard Highfill, Tally Due, MD, Last Rate: 20 mL/hr at 10/18/22 1408, Continued from Pre-op at 10/18/22 1408   [MAR Hold] acetaminophen (TYLENOL) tablet 650 mg, 650 mg, Oral, Q6H PRN, 650 mg at 10/17/22 2230 **OR** [MAR Hold] acetaminophen (TYLENOL) suppository 650 mg, 650 mg, Rectal, Q6H PRN, Fritzi Mandes, MD   [facility] Hold] bisacodyl (DULCOLAX)  suppository 10 mg, 10 mg, Rectal, Daily PRN, Fritzi Mandes, MD   influenza vaccine adjuvanted (FLUAD) injection 0.5 mL, 0.5 mL, Intramuscular, Tomorrow-1000, Fritzi Mandes, MD   [facility] Hold] insulin aspart (novoLOG) injection 0-15 Units, 0-15 Units, Subcutaneous, TID WC, Fritzi Mandes, MD, 8 Units at 10/18/22 1220   [MAR Hold] ondansetron (ZOFRAN) tablet 4 mg, 4 mg, Oral, Q6H PRN **OR** [MAR Hold] ondansetron (ZOFRAN) injection 4 mg, 4 mg, Intravenous, Q6H PRN, Fritzi Mandes, MD   [facility] Hold] Oral care mouth rinse, 15 mL, Mouth Rinse, PRN, Fritzi Mandes, MD   [facility] Hold] oxyCODONE-acetaminophen (PERCOCET/ROXICET) 5-325 MG per tablet 1-2 tablet, 1-2 tablet, Oral, Q6H PRN, Mansy, Jan A, MD, 1 tablet at 10/18/22 0127   pantoprozole (PROTONIX) 80 mg /NS 100 mL infusion, 8 mg/hr, Intravenous, Continuous, Rada Hay, MD, Last Rate: 10 mL/hr at 10/18/22 0804, 8 mg/hr at 10/18/22 0804   [MAR Hold] polyethylene glycol (MIRALAX / GLYCOLAX) packet 17 g, 17 g, Oral, Daily PRN, Fritzi Mandes, MD   potassium PHOSPHATE 15 mmol in dextrose 5 % 250 mL infusion, 15 mmol, Intravenous, Once, Noralee Space, RPH, Last Rate: 43 mL/hr at 10/18/22 1220, 15 mmol at 10/18/22 1220   [MAR Hold] senna (SENOKOT) tablet 8.6 mg, 1 tablet, Oral, BID, Fritzi Mandes, MD   [facility] Hold] traZODone (DESYREL) tablet 25 mg, 25 mg, Oral, QHS PRN, Fritzi Mandes, MD, 25 mg at 10/18/22 0001   Family History  Problem Relation Age of Onset   Hypertension Mother    Heart disease Mother    Diabetes Mother    Hypertension Father    Heart disease Father      Social History   Tobacco Use   Smoking status: Former    Packs/day: 1.00    Years: 50.00    Total pack years: 50.00    Types: Cigarettes    Quit date: 09/13/2017    Years since quitting: 5.0   Smokeless tobacco: Never  Vaping Use   Vaping Use: Never used  Substance Use Topics   Alcohol use: Never   Drug use: Never    Allergies as of 10/17/2022 - Review Complete 10/17/2022  Allergen  Reaction Noted   Amoxicillin Anaphylaxis 03/05/2017   Lisinopril Anaphylaxis 03/05/2017   Tramadol Nausea Only 10/14/2018   Gabapentin Other (See Comments) 04/09/2021   Colchicine Nausea Only 10/17/2022    Review of Systems:    All systems reviewed and negative except where noted in HPI.   Physical Exam:  Vital signs in last 24 hours: Temp:  [97.4 F (36.3 C)-98.9 F (37.2 C)] 98.9 F (37.2 C) (11/05 1400) Pulse Rate:  [64-79] 74 (11/05 0743) Resp:  [14-20] 20 (11/05 1400) BP: (114-162)/(44-61) 162/61 (11/05 1400) SpO2:  [91 %-95 %] 93 % (11/05 1400) Last BM  Date : 10/17/22 General:   Pleasant, cooperative in NAD Head:  Normocephalic and atraumatic. Eyes:   No icterus.   Conjunctiva pale. PERRLA. Ears:  Normal auditory acuity. Neck:  Supple; no masses or thyroidomegaly Lungs: Respirations even and unlabored. Lungs clear to auscultation bilaterally.   No wheezes, crackles, or rhonchi.  Heart:  Regular rate and rhythm;  Without murmur, clicks, rubs or gallops Abdomen:  Soft, nondistended, nontender. Normal bowel sounds. No appreciable masses or hepatomegaly.  No rebound or guarding.  Rectal:  Not performed. Msk:  Symmetrical without gross deformities.  Strength generalized weakness Extremities:  Without edema, cyanosis or clubbing. Neurologic:  Alert and oriented x3;  grossly normal neurologically. Skin:  Intact without significant lesions or rashes. Psych:  Alert and cooperative. Normal affect.  LAB RESULTS:    Latest Ref Rng & Units 10/18/2022    4:35 AM 10/17/2022    1:52 PM  CBC  WBC 4.0 - 10.5 K/uL 17.8  12.7   Hemoglobin 12.0 - 15.0 g/dL 7.2  8.0   Hematocrit 36.0 - 46.0 % 21.8  24.5   Platelets 150 - 400 K/uL 200  222     BMET    Latest Ref Rng & Units 10/18/2022    7:26 AM 10/18/2022    4:35 AM 10/17/2022    9:42 PM  BMP  Glucose 70 - 99 mg/dL 185  128  127   BUN 8 - 23 mg/dL 53  59  69   Creatinine 0.44 - 1.00 mg/dL 1.58  1.41  1.63   Sodium 135 - 145  mmol/L 138  138  136   Potassium 3.5 - 5.1 mmol/L 4.1  3.8  3.1   Chloride 98 - 111 mmol/L 105  105  99   CO2 22 - 32 mmol/L _0 Calcium 8.9 - 10.3 mg/dL 8.5  8.4  8.5     LFT    Latest Ref Rng & Units 10/17/2022    1:52 PM  Hepatic Function  Total Protein 6.5 - 8.1 g/dL 6.0   Albumin 3.5 - 5.0 g/dL 3.2   AST 15 - 41 U/L 14   ALT 0 - 44 U/L 12   Alk Phosphatase 38 - 126 U/L 47   Total Bilirubin 0.3 - 1.2 mg/dL 0.5      STUDIES: No results found.    Impression / Plan:   Kelly Rowland is a 81 y.o. female with history of nonobstructive coronary disease, remote history of MI, COPD, CKD, remote history peptic ulcer disease now presents with 2 days history of melena and anemia with elevated BUN/creatinine  Melena, acute anemia with elevated BUN/creatinine suggestive of upper GI bleed Recommend EGD for further evaluation Monitor CBC closely to maintain hemoglobin above 8 Check iron panel, B12 and folate levels Continue pantoprazole drip Maintain 2 large-bore IVs Maintain strict n.p.o. If EGD is unremarkable, recommend colonoscopy with possible video capsule endoscopy  I have discussed alternative options, risks & benefits,  which include, but are not limited to, bleeding, infection, perforation,respiratory complication & drug reaction.  The patient agrees with this plan & written consent will be obtained.     Thank you for involving me in the care of this patient.  GI will follow along with you    LOS: 1 day   Sherri Sear, MD  10/18/2022, 3:04 PM    Note: This dictation was prepared with Dragon dictation along with smaller phrase technology. Any transcriptional errors that result  from this process are unintentional.

## 2022-10-18 NOTE — Evaluation (Signed)
Physical Therapy Evaluation Patient Details Name: Kelly Rowland MRN: [id number] DOB: [birth year] Today's Date: 10/18/2022  History of Present Illness  Pt is a 81 y/o F admitted on 10/17/22 after presenting to the ED with c/o generalized weakness, dizziness, & black tarry stools x 2 days. Pt is being treated for GI bleed. PMH: DJD, CAD, CKD, DM2, peptic ulcer disease, HTN, COPD  Clinical Impression  Pt seen for PT evaluation with pt agreeable to tx. Pt reports prior to admission she was mod I with rollator in the home, SPC in the community, still driving, but up to 3 falls in the past year 2/2 "falling asleep" while standing up. Pt also endorses chronic back pain that was exacerbated in May resulting in current significantly decreased gait speed. Pt reports she is anticipating having nerves in her back "burned" sometime this month. On this date, pt is able to complete bed mobility with mod I (HOB elevated & use of bed rails) & ambulate in room with RW & supervision. Pt requires significantly extra time to ambulate short distance in room but with steady gait. Anticipate pt can d/c home with HHPT & PRN assistance from family. Will continue to follow pt acutely to address activity tolerance, gait, and balance.    Recommendations for follow up therapy are one component of a multi-disciplinary discharge planning process, led by the attending physician.  Recommendations may be updated based on patient status, additional functional criteria and insurance authorization.  Follow Up Recommendations Home health PT      Assistance Recommended at Discharge PRN  Patient can return home with the following  Assistance with cooking/housework    Equipment Recommendations None recommended by PT  Recommendations for Other Services       Functional Status Assessment Patient has had a recent decline in their functional status and demonstrates the ability to make significant improvements in function in a reasonable  and predictable amount of time.     Precautions / Restrictions Precautions Precautions: Fall Restrictions Weight Bearing Restrictions: No      Mobility  Bed Mobility Overal bed mobility: Modified Independent Bed Mobility: Supine to Sit     Supine to sit: Modified independent (Device/Increase time), HOB elevated     General bed mobility comments: uses bed rails    Transfers Overall transfer level: Modified independent Equipment used: Rolling walker (2 wheels)               General transfer comment: STS from EOB with RW    Ambulation/Gait Ambulation/Gait assistance: Supervision Gait Distance (Feet): 20 Feet Assistive device: Rolling walker (2 wheels) Gait Pattern/deviations: Decreased step length - right, Decreased stride length, Decreased dorsiflexion - left, Decreased step length - left, Decreased dorsiflexion - right Gait velocity: significantly decreased     General Gait Details: Pt with significantly decreased gait speed with pt reporting this is baseline since May 2/2 back pain exacerbation.  Stairs            Wheelchair Mobility    Modified Rankin (Stroke Patients Only)       Balance Overall balance assessment: Needs assistance Sitting-balance support: Feet supported Sitting balance-Leahy Scale: Good Sitting balance - Comments: supervision static sitting EOB   Standing balance support: During functional activity, Bilateral upper extremity supported Standing balance-Leahy Scale: Good                               Pertinent Vitals/Pain Pain Assessment Pain Assessment: Faces  Faces Pain Scale: Hurts little more Pain Location: chronic back pain radiating to posterior BLE hips, back of legs, ankles. Pain Descriptors / Indicators: Discomfort Pain Intervention(s): Monitored during session    Home Living Family/patient expects to be discharged to:: Private residence Living Arrangements: Alone Available Help at Discharge:  Family;Available PRN/intermittently Type of Home: Apartment Home Access: Level entry       Home Layout: One level Home Equipment: Rollator (4 wheels);Cane - single point      Prior Function Prior Level of Function : Driving;Independent/Modified Independent             Mobility Comments: Pt reports she uses rollator in the home, [facility] in the community (as pt unable to easily transport rollator). Pt endorses 2-3 falls in the past year 2/2 "falling asleep" while standing. Pt reports she still drives & is independent without assistance from another.       Hand Dominance        Extremity/Trunk Assessment   Upper Extremity Assessment Upper Extremity Assessment: Overall WFL for tasks assessed    Lower Extremity Assessment Lower Extremity Assessment: Generalized weakness       Communication   Communication: No difficulties  Cognition Arousal/Alertness: Awake/alert Behavior During Therapy: WFL for tasks assessed/performed Overall Cognitive Status: Within Functional Limits for tasks assessed                                          General Comments General comments (skin integrity, edema, etc.): PT educated pt on need to ambulate to bathroom with staff while in acute setting    Exercises     Assessment/Plan    PT Assessment Patient needs continued PT services  PT Problem List Decreased strength;Pain;Decreased activity tolerance;Decreased balance;Decreased mobility;Decreased knowledge of use of DME       PT Treatment Interventions DME instruction;Therapeutic exercise;Balance training;Gait training;Neuromuscular re-education;Functional mobility training;Therapeutic activities;Patient/family education    PT Goals (Current goals can be found in the Care Plan section)  Acute Rehab PT Goals Patient Stated Goal: decreased pain, have anticipated back surgery PT Goal Formulation: With patient Time For Goal Achievement: 11/01/22 Potential to Achieve Goals:  Good    Frequency Min 2X/week     Co-evaluation               AM-PAC PT "6 Clicks" Mobility  Outcome Measure Help needed turning from your back to your side while in a flat bed without using bedrails?: None Help needed moving from lying on your back to sitting on the side of a flat bed without using bedrails?: None Help needed moving to and from a bed to a chair (including a wheelchair)?: A Little Help needed standing up from a chair using your arms (e.g., wheelchair or bedside chair)?: None Help needed to walk in hospital room?: A Little Help needed climbing 3-5 steps with a railing? : A Little 6 Click Score: 21    End of Session   Activity Tolerance: Patient tolerated treatment well Patient left: in chair;with chair alarm set;with call bell/phone within reach Nurse Communication: Mobility status PT Visit Diagnosis: Muscle weakness (generalized) (M62.81)    Time: 1740-8144 PT Time Calculation (min) (ACUTE ONLY): 22 min   Charges:   PT Evaluation $PT Eval Moderate Complexity: 1 Mod          Aleda Grana, PT, DPT 10/18/22, 10:52 AM   Sandi Mariscal 10/18/2022, 10:50 AM

## 2022-10-18 NOTE — Plan of Care (Signed)
  Problem: Education: Goal: Knowledge of General Education information will improve Description: Including pain rating scale, medication(s)/side effects and non-pharmacologic comfort measures Outcome: Progressing   Problem: Health Behavior/Discharge Planning: Goal: Ability to manage health-related needs will improve Outcome: Progressing   Problem: Metabolic: Goal: Ability to maintain appropriate glucose levels will improve Outcome: Progressing   Problem: Nutritional: Goal: Maintenance of adequate nutrition will improve Outcome: Progressing

## 2022-10-18 NOTE — Anesthesia Preprocedure Evaluation (Signed)
Anesthesia Evaluation  Patient identified by MRN, date of birth, ID band Patient awake    Reviewed: Allergy & Precautions, H&P , NPO status , Patient's Chart, lab work & pertinent test results, reviewed documented beta blocker date and time   Airway Mallampati: II   Neck ROM: full    Dental  (+) Poor Dentition   Pulmonary neg pulmonary ROS, COPD, former smoker   Pulmonary exam normal        Cardiovascular Exercise Tolerance: Poor hypertension, Pt. on medications + CAD and + Peripheral Vascular Disease  negative cardio ROS Normal cardiovascular exam Rhythm:regular Rate:Normal     Neuro/Psych negative neurological ROS  negative psych ROS   GI/Hepatic negative GI ROS, Neg liver ROS,,,  Endo/Other  negative endocrine ROSdiabetes, Well Controlled    Renal/GU Renal diseasenegative Renal ROS  negative genitourinary   Musculoskeletal  (+) Arthritis ,    Abdominal   Peds  Hematology negative hematology ROS (+) Blood dyscrasia, anemia   Anesthesia Other Findings Past Medical History: No date: Arthritis No date: CAD (coronary artery disease) No date: Chronic kidney disease No date: COPD (chronic obstructive pulmonary disease) (HCC) No date: Diabetes mellitus without complication (HCC) No date: Hyperlipidemia No date: Hypertension No date: Restless legs syndrome (RLS) Past Surgical History: No date: ABDOMINAL HYSTERECTOMY No date: APPENDECTOMY 2019: CATARACT EXTRACTION; Bilateral No date: TONSILLECTOMY BMI    Body Mass Index: 26.34 kg/m     Reproductive/Obstetrics negative OB ROS                             Anesthesia Physical Anesthesia Plan  ASA: 3 and emergent  Anesthesia Plan: General   Post-op Pain Management:    Induction:   PONV Risk Score and Plan:   Airway Management Planned:   Additional Equipment:   Intra-op Plan:   Post-operative Plan:   Informed Consent: I  have reviewed the patients History and Physical, chart, labs and discussed the procedure including the risks, benefits and alternatives for the proposed anesthesia with the patient or authorized representative who has indicated his/her understanding and acceptance.     Dental Advisory Given  Plan Discussed with: CRNA  Anesthesia Plan Comments:        Anesthesia Quick Evaluation

## 2022-10-18 NOTE — Transfer of Care (Signed)
Immediate Anesthesia Transfer of Care Note  Patient: Kelly Rowland  Procedure(s) Performed: ESOPHAGOGASTRODUODENOSCOPY (EGD) WITH PROPOFOL  Patient Location: Endoscopy Unit  Anesthesia Type:General  Level of Consciousness: drowsy  Airway & Oxygen Therapy: Patient Spontanous Breathing and Patient connected to nasal cannula oxygen  Post-op Assessment: Report given to RN and Post -op Vital signs reviewed and stable  Post vital signs: Reviewed and stable  Last Vitals:  Vitals Value Taken Time  BP 132/57   Temp    Pulse 73   Resp 12   SpO2 94     Last Pain:  Vitals:   10/18/22 1400  TempSrc: Temporal  PainSc:       Patients Stated Pain Goal: 3 (01/65/53 7482)  Complications: No notable events documented.

## 2022-10-19 ENCOUNTER — Encounter: Payer: Self-pay | Admitting: Internal Medicine

## 2022-10-19 ENCOUNTER — Inpatient Hospital Stay (HOSPITAL_COMMUNITY)
Admit: 2022-10-19 | Discharge: 2022-10-19 | Disposition: A | Discharge status: Home / Self Care | Payer: Medicare Other | Attending: Internal Medicine | Admitting: Internal Medicine

## 2022-10-19 ENCOUNTER — Inpatient Hospital Stay: Payer: Medicare Other

## 2022-10-19 DIAGNOSIS — R079 Chest pain, unspecified: Secondary | ICD-10-CM

## 2022-10-19 DIAGNOSIS — J810 Acute pulmonary edema: Secondary | ICD-10-CM | POA: Diagnosis not present

## 2022-10-19 DIAGNOSIS — R0603 Acute respiratory distress: Secondary | ICD-10-CM

## 2022-10-19 DIAGNOSIS — D6489 Other specified anemias: Secondary | ICD-10-CM

## 2022-10-19 DIAGNOSIS — K922 Gastrointestinal hemorrhage, unspecified: Secondary | ICD-10-CM | POA: Diagnosis not present

## 2022-10-19 LAB — CBC
HCT: 21.8 % — ABNORMAL LOW (ref 36.0–46.0)
Hemoglobin: 7.1 g/dL — ABNORMAL LOW (ref 12.0–15.0)
MCH: 30.1 pg (ref 26.0–34.0)
MCHC: 32.6 g/dL (ref 30.0–36.0)
MCV: 92.4 fL (ref 80.0–100.0)
Platelets: 178 10*3/uL (ref 150–400)
RBC: 2.36 MIL/uL — ABNORMAL LOW (ref 3.87–5.11)
RDW: 18.6 % — ABNORMAL HIGH (ref 11.5–15.5)
WBC: 14.3 10*3/uL — ABNORMAL HIGH (ref 4.0–10.5)
nRBC: 0 % (ref 0.0–0.2)

## 2022-10-19 LAB — TROPONIN I (HIGH SENSITIVITY)
Troponin I (High Sensitivity): 15 ng/L (ref <18)
Troponin I (High Sensitivity): 17 ng/L (ref <18)

## 2022-10-19 LAB — PREPARE RBC (CROSSMATCH)

## 2022-10-19 LAB — HEMOGLOBIN AND HEMATOCRIT, BLOOD
HCT: 22.8 % — ABNORMAL LOW (ref 36.0–46.0)
HCT: 25.2 % — ABNORMAL LOW (ref 36.0–46.0)
Hemoglobin: 7.3 g/dL — ABNORMAL LOW (ref 12.0–15.0)
Hemoglobin: 8.3 g/dL — ABNORMAL LOW (ref 12.0–15.0)

## 2022-10-19 LAB — ECHOCARDIOGRAM COMPLETE
Area-P 1/2: 3.89 cm2
Height: 62 in
S' Lateral: 2.8 cm
Weight: 2304 oz

## 2022-10-19 LAB — BASIC METABOLIC PANEL
Anion gap: 4 — ABNORMAL LOW (ref 5–15)
BUN: 32 mg/dL — ABNORMAL HIGH (ref 8–23)
CO2: 26 mmol/L (ref 22–32)
Calcium: 8.1 mg/dL — ABNORMAL LOW (ref 8.9–10.3)
Chloride: 110 mmol/L (ref 98–111)
Creatinine, Ser: 1.23 mg/dL — ABNORMAL HIGH (ref 0.44–1.00)
GFR, Estimated: 45 mL/min — ABNORMAL LOW (ref >60)
Glucose, Bld: 178 mg/dL — ABNORMAL HIGH (ref 70–99)
Potassium: 3.3 mmol/L — ABNORMAL LOW (ref 3.5–5.1)
Sodium: 140 mmol/L (ref 135–145)

## 2022-10-19 LAB — GLUCOSE, CAPILLARY
Glucose-Capillary: 128 mg/dL — ABNORMAL HIGH (ref 70–99)
Glucose-Capillary: 166 mg/dL — ABNORMAL HIGH (ref 70–99)
Glucose-Capillary: 189 mg/dL — ABNORMAL HIGH (ref 70–99)
Glucose-Capillary: 192 mg/dL — ABNORMAL HIGH (ref 70–99)
Glucose-Capillary: 74 mg/dL (ref 70–99)

## 2022-10-19 LAB — BRAIN NATRIURETIC PEPTIDE: B Natriuretic Peptide: 534 pg/mL — ABNORMAL HIGH (ref 0.0–100.0)

## 2022-10-19 LAB — PHOSPHORUS: Phosphorus: 2.8 mg/dL (ref 2.5–4.6)

## 2022-10-19 MED ORDER — IOHEXOL 350 MG/ML SOLN
75.0000 mL | Freq: Once | INTRAVENOUS | Status: AC | PRN
  Administered 2022-10-19: 75 mL via INTRAVENOUS

## 2022-10-19 MED ORDER — FOLIC ACID 1 MG PO TABS
1000.0000 ug | ORAL_TABLET | Freq: Every day | ORAL | Status: DC
  Administered 2022-10-19 – 2022-11-04 (×16): 1 mg via ORAL
  Filled 2022-10-19 (×16): qty 1

## 2022-10-19 MED ORDER — VITAMIN B-12 1000 MCG PO TABS
500.0000 ug | ORAL_TABLET | Freq: Every day | ORAL | Status: DC
  Administered 2022-10-19 – 2022-11-04 (×16): 500 ug via ORAL
  Filled 2022-10-19 (×16): qty 1

## 2022-10-19 MED ORDER — VITAMIN D 25 MCG (1000 UNIT) PO TABS
2000.0000 [IU] | ORAL_TABLET | Freq: Every day | ORAL | Status: DC
  Administered 2022-10-19 – 2022-11-04 (×16): 2000 [IU] via ORAL
  Filled 2022-10-19 (×16): qty 2

## 2022-10-19 MED ORDER — AMLODIPINE BESYLATE 5 MG PO TABS
5.0000 mg | ORAL_TABLET | Freq: Every day | ORAL | Status: DC
  Administered 2022-10-19 – 2022-10-20 (×2): 5 mg via ORAL
  Filled 2022-10-19 (×2): qty 1

## 2022-10-19 MED ORDER — SODIUM CHLORIDE 0.9% IV SOLUTION: Freq: Once | INTRAVENOUS | Status: AC

## 2022-10-19 MED ORDER — POTASSIUM CHLORIDE 10 MEQ/100ML IV SOLN
10.0000 meq | INTRAVENOUS | Status: DC
  Administered 2022-10-19: 10 meq via INTRAVENOUS
  Filled 2022-10-19: qty 100

## 2022-10-19 MED ORDER — SIMVASTATIN 20 MG PO TABS
20.0000 mg | ORAL_TABLET | Freq: Every day | ORAL | Status: DC
  Administered 2022-10-19 – 2022-10-29 (×11): 20 mg via ORAL
  Filled 2022-10-19 (×11): qty 1

## 2022-10-19 MED ORDER — CARVEDILOL 25 MG PO TABS
25.0000 mg | ORAL_TABLET | Freq: Two times a day (BID) | ORAL | Status: DC
  Administered 2022-10-19 – 2022-10-20 (×3): 25 mg via ORAL
  Filled 2022-10-19 (×3): qty 1

## 2022-10-19 MED ORDER — ALBUTEROL SULFATE (2.5 MG/3ML) 0.083% IN NEBU: 3.0000 mL | INHALATION_SOLUTION | RESPIRATORY_TRACT | Status: DC | PRN

## 2022-10-19 MED ORDER — GLIMEPIRIDE 4 MG PO TABS
4.0000 mg | ORAL_TABLET | Freq: Every day | ORAL | Status: DC
  Administered 2022-10-19 – 2022-10-26 (×7): 4 mg via ORAL
  Filled 2022-10-19 (×9): qty 1

## 2022-10-19 MED ORDER — MOMETASONE FURO-FORMOTEROL FUM 200-5 MCG/ACT IN AERO
2.0000 | INHALATION_SPRAY | Freq: Two times a day (BID) | RESPIRATORY_TRACT | Status: DC
  Administered 2022-10-19 – 2022-11-03 (×30): 2 via RESPIRATORY_TRACT
  Filled 2022-10-19: qty 8.8

## 2022-10-19 MED ORDER — INSULIN GLARGINE-YFGN 100 UNIT/ML ~~LOC~~ SOLN
10.0000 [IU] | Freq: Every day | SUBCUTANEOUS | Status: DC
  Administered 2022-10-19 – 2022-10-28 (×10): 10 [IU] via SUBCUTANEOUS
  Filled 2022-10-19 (×11): qty 0.1

## 2022-10-19 MED ORDER — GABAPENTIN 100 MG PO CAPS
100.0000 mg | ORAL_CAPSULE | Freq: Every day | ORAL | Status: DC
  Administered 2022-10-19 – 2022-10-22 (×4): 100 mg via ORAL
  Filled 2022-10-19 (×4): qty 1

## 2022-10-19 MED ORDER — POTASSIUM CHLORIDE 10 MEQ/100ML IV SOLN
10.0000 meq | INTRAVENOUS | Status: AC
  Administered 2022-10-19 (×3): 10 meq via INTRAVENOUS
  Filled 2022-10-19 (×2): qty 100

## 2022-10-19 MED ORDER — FUROSEMIDE 10 MG/ML IJ SOLN
20.0000 mg | Freq: Once | INTRAMUSCULAR | Status: AC
  Administered 2022-10-19: 20 mg via INTRAVENOUS
  Filled 2022-10-19: qty 4

## 2022-10-19 NOTE — Consult Note (Signed)
PHARMACY CONSULT NOTE - FOLLOW UP  Pharmacy Consult for Electrolyte Monitoring and Replacement   Recent Labs: Potassium (mmol/L)  Date Value  10/19/2022 3.3 (L)   Magnesium (mg/dL)  Date Value  10/18/2022 2.2   Calcium (mg/dL)  Date Value  10/19/2022 8.1 (L)   Albumin (g/dL)  Date Value  10/17/2022 3.2 (L)   Phosphorus (mg/dL)  Date Value  10/19/2022 2.8   Sodium (mmol/L)  Date Value  10/19/2022 140     Assessment: 81yo F w/ h/o CAD, CKD, COPD, DM, HTN, & HLD who presents with dark stool. At this time unable to use gut for repletion, will utilize IV instead. Pharmacy consulted for mgmt of electrolytes.  IVF stopped 11/6  Goal of Therapy:  Lytes WNL  Plan:  KCL IV 18mEq x 4 doses ordered CTM and replace PRN. F/u labs in  AM  Pearla Dubonnet, PharmD Clinical Pharmacist 10/19/2022 11:14 AM

## 2022-10-19 NOTE — Progress Notes (Signed)
[facility] [place] NAME: Kelly Rowland    MR#:  [id number]  DATE OF BIRTH:  1940/12/28  SUBJECTIVE:  patient underwent endoscopy yesterday. Today went down for CT scan of the abdomen. Complained of chest pain. Rapid response was called. By the time I went see patient she was chest pain-free. Hemoglobin remains 7.3. Will give another unit of blood transfusion today. Tolerating full liquid diet. No abdominal pain. Complains of chronic back pain. No family at bedside during my eval.  Patient also started having new oxygen requirement. Chest x-ray shows some pulmonary edema.  VITALS:  Blood pressure (!) 143/42, pulse 91, temperature 98.7 F (37.1 C), temperature source Oral, resp. rate 17, height 5\' 2"  (1.575 m), weight 65.3 kg, SpO2 95 %.  PHYSICAL EXAMINATION:   GENERAL:  81 y.o.-year-old patient lying in the bed with no acute distress.  LUNGS decreased breath sounds bilaterally, no wheezing CARDIOVASCULAR: S1, S2 normal. No murmurs,   ABDOMEN: Soft, nontender, nondistended. Bowel sounds present.  EXTREMITIES: No  edema b/l.    NEUROLOGIC: nonfocal  patient is alert and awake SKIN: No obvious rash, lesion, or ulcer.   LABORATORY PANEL:  CBC Recent Labs  Lab 10/19/22 0512 10/19/22 0822  WBC 14.3*  --   HGB 7.1* 7.3*  HCT 21.8* 22.8*  PLT 178  --      Chemistries  Recent Labs  Lab 10/17/22 1352 10/17/22 2142 10/18/22 0435 10/18/22 0726 10/19/22 0512  NA 133*   < > 138   < > 140  K 2.8*   < > 3.8   < > 3.3*  CL 96*   < > 105   < > 110  CO2 24   < > 25   < > 26  GLUCOSE 192*   < > 128*   < > 178*  BUN 71*   < > 59*   < > 32*  CREATININE 1.59*   < > 1.41*   < > 1.23*  CALCIUM 8.6*   < > 8.4*   < > 8.1*  MG 2.4  --  2.2  --   --   AST 14*  --   --   --   --   ALT 12  --   --   --   --   ALKPHOS 47  --   --   --   --   BILITOT 0.5  --   --   --   --    < > = values in this interval not displayed.    Assessment and  Plan  Kelly Rowland is a 81 y.o. female with medical history significant of DJD, CAD, chronic kidney disease, type II diabetes, history of peptic ulcer disease, hypertension comes emergency room with generalized weakness/dizziness and black tarry stools for last two days. Patient states she took [facility] powder last week for her back pain. She denies any vomiting or any bright red blood per rectum. Patient was found to have hemoglobin of 8.0. Her hemoglobin at baseline is 15.0 in 2022.   [name] -- patient presents with black tarry stools times three since yesterday. -- As history of peptic ulcer disease -- took [facility] powder is last week for back pain -- IV Protonix, IV fluids -- came in with hemoglobin of 8.0--7.2 -- baseline hemoglobin 15.0 in June 2022 -- EGD December 2019    - Normal esophagus.                        -  Gastritis with hemorrhage. Biopsied.                        - Normal examined duodenum.  --Colonoscopy December 2019- One 7 mm polyp in the ascending colon, removed with a                        cold snare. Resected and retrieved.                        - Diverticulosis in the sigmoid colon.                        - Internal hemorrhoids.  -- Transfuse as needed --11/6--EGD - Bleeding friable duodenal mucosa. Injected. Treated                         with bipolar cautery.                        - Normal stomach.                        - Normal gastroesophageal junction and esophagus.  Chest pain with acute hypoxic respiratory failure suspect pulmonary edema with volume overload history of CAD status post stent -- chest x-ray shows bilateral pulmonary edema -- IV Lasix 20 mg times one -- seen by cardiology [facility] appreciate input -- echo of the heart -- troponin times one negative -- EKG no acute ST-T changes   Hypokalemia Hyponatremia -- pharmacy to replace potassium --resolved   CKD stage III -- baseline creatinine 1.4-- 1.7 -- creatinine 1.5 -- continue to  monitor   CAD -- denies chest pain, stable   History of hypertension -- will hold BP meds for now... Pressure stable        Advance Care Planning: FULL CODE per patient Consults: G.I. Dr. Marius Ditch  Family Communication:none today Procedures: DVT Prophylaxis :SCD Level of care: Med-Surg Status is: Inpatient Remains inpatient appropriate because: anemia, shortness of breath, chest pain  TOTAL TIME TAKING CARE OF THIS PATIENT: 35 minutes.  >50% time spent on counselling and coordination of care  Note: This dictation was prepared with Dragon dictation along with smaller phrase technology. Any transcriptional errors that result from this process are unintentional.  Fritzi Mandes M.D    [facility]   CC: Primary care physician; Volanda Napoleon, MD

## 2022-10-19 NOTE — Plan of Care (Signed)
Pt is alert and oriented x 2. Oxygen weaned to 2L sating 98%.  Problem: Education: Goal: Knowledge of General Education information will improve Description: Including pain rating scale, medication(s)/side effects and non-pharmacologic comfort measures Outcome: Progressing   Problem: Health Behavior/Discharge Planning: Goal: Ability to manage health-related needs will improve Outcome: Progressing   Problem: Clinical Measurements: Goal: Ability to maintain clinical measurements within normal limits will improve Outcome: Progressing Goal: Will remain free from infection Outcome: Progressing Goal: Diagnostic test results will improve Outcome: Progressing Goal: Respiratory complications will improve Outcome: Progressing Goal: Cardiovascular complication will be avoided Outcome: Progressing   Problem: Activity: Goal: Risk for activity intolerance will decrease Outcome: Progressing   Problem: Nutrition: Goal: Adequate nutrition will be maintained Outcome: Progressing   Problem: Coping: Goal: Level of anxiety will decrease Outcome: Progressing   Problem: Elimination: Goal: Will not experience complications related to bowel motility Outcome: Progressing Goal: Will not experience complications related to urinary retention Outcome: Progressing   Problem: Pain Managment: Goal: General experience of comfort will improve Outcome: Progressing   Problem: Safety: Goal: Ability to remain free from injury will improve Outcome: Progressing   Problem: Skin Integrity: Goal: Risk for impaired skin integrity will decrease Outcome: Progressing   Problem: Education: Goal: Ability to describe self-care measures that may prevent or decrease complications (Diabetes Survival Skills Education) will improve Outcome: Progressing Goal: Individualized Educational Video(s) Outcome: Progressing   Problem: Coping: Goal: Ability to adjust to condition or change in health will improve Outcome:  Progressing   Problem: Fluid Volume: Goal: Ability to maintain a balanced intake and output will improve Outcome: Progressing   Problem: Health Behavior/Discharge Planning: Goal: Ability to identify and utilize available resources and services will improve Outcome: Progressing Goal: Ability to manage health-related needs will improve Outcome: Progressing   Problem: Metabolic: Goal: Ability to maintain appropriate glucose levels will improve Outcome: Progressing   Problem: Nutritional: Goal: Maintenance of adequate nutrition will improve Outcome: Progressing Goal: Progress toward achieving an optimal weight will improve Outcome: Progressing   Problem: Skin Integrity: Goal: Risk for impaired skin integrity will decrease Outcome: Progressing   Problem: Tissue Perfusion: Goal: Adequacy of tissue perfusion will improve Outcome: Progressing

## 2022-10-19 NOTE — Progress Notes (Signed)
PT Cancellation Note  Patient Details Name: Kelly Rowland MRN: [id number] DOB: 1941/08/04   Cancelled Treatment:    Reason Eval/Treat Not Completed: Medical issues which prohibited therapy (Rapid response called this morning and patient awaiting blood transfusion. PT will follow up tomorrow or as appropriate.)  Minna Merritts, PT, MPT   Percell Locus 10/19/2022, 3:39 PM

## 2022-10-19 NOTE — Consult Note (Signed)
Cardiology Consultation:   Patient ID: Kelly Rowland; [id number]; 1941-03-04   Admit date: 10/17/2022 Date of Consult: 10/19/2022  Primary Care Provider: Volanda Napoleon, MD Primary Cardiologist: [facility] Primary Electrophysiologist:  None   Patient Profile:   Kelly Rowland is a 81 y.o. female with a hx of CAD with remote MI conservatively managed as outlined below, PAD status post remote intervention with left SFA SES in 2778, diastolic dysfunction, chronic back pain, bilateral carotid artery stenosis, renal dysfunction, DM2, HTN, and HLD who is admitted with melena and symptomatic anemia and is being seen today for the evaluation of chest pain at the request of Dr. Posey Pronto.  History of Present Illness:   Kelly Rowland has a history of CAD with MI in 2013.  At that time, she was found to have total occlusion of the RCA with left-to-right collaterals and no intervention pursued.  Most recent ischemic evaluation in 08/2017 showed no evidence of ischemia with a normal LVEF.  Most recent echo from 2018 demonstrated a preserved LV systolic function with grade 1 diastolic dysfunction and mild mitral regurgitation.  She has known bilateral carotid artery stenosis with carotid duplex in 2022 demonstrating stable 20 to 49% RICA stenosis and 50 to 24% LICA stenosis.  She was admitted to [facility] on 10/17/2022 with generalized weakness and dizziness with black tarry stools.  She had been taking BC powder for back pain.  She was found to have melena with an initial hemoglobin of 8.0 with a baseline around 15.  Hemoglobin trended to 7.2 and patient was given 1 unit PRBC.  Initial EKG showed sinus rhythm without acute ischemic changes.  She underwent EGD on 11/5 which showed bleeding friable duodenal mucosa that was injected and treated with bipolar cautery.  Following EGD, she was noted to have a new supplemental oxygen requirement which persists at this time.  While in radiology this morning, following CT of the  abdomen/pelvis she reported a 4 to 5-minute episode of substernal chest pressure without associated symptoms.  Symptoms resolved without intervention.  Chest x-ray demonstrated bilateral interstitial densities possibly reflective of edema.  Repeat EKG showed sinus rhythm without acute ischemic changes.  High-sensitivity troponin negative.  Hemodynamically stable.  Currently chest pain-free.    Past Medical History:  Diagnosis Date   Arthritis    CAD (coronary artery disease)    Chronic kidney disease    COPD (chronic obstructive pulmonary disease) (HCC)    Diabetes mellitus without complication (HCC)    Hyperlipidemia    Hypertension    Restless legs syndrome (RLS)     Past Surgical History:  Procedure Laterality Date   ABDOMINAL HYSTERECTOMY     APPENDECTOMY     CATARACT EXTRACTION Bilateral 2019   TONSILLECTOMY       Home Meds: Prior to Admission medications   Medication Sig Start Date End Date Taking? Authorizing Provider  amLODipine (NORVASC) 5 MG tablet Take 5 mg by mouth daily. 03/01/17  Yes [provider]  aspirin 81 MG EC tablet Take 81 mg by mouth daily.   Yes [provider]  carvedilol (COREG) 25 MG tablet Take 25 mg by mouth 2 (two) times daily. 11/13/21  Yes [provider]  Cholecalciferol 50 MCG (2000 UT) CAPS Take 1 capsule by mouth daily.   Yes [provider]  fluticasone-salmeterol (ADVAIR) 250-50 MCG/ACT AEPB Inhale 1 puff into the lungs 2 (two) times daily. 11/03/21  Yes [provider]  folic acid (FOLVITE) 235 MCG tablet Take 1  tablet by mouth daily.   Yes [provider]  furosemide (LASIX) 20 MG tablet Take 20-40 mg by mouth daily. 11/13/21  Yes [provider]  gabapentin (NEURONTIN) 100 MG capsule Take 100 mg by mouth at bedtime. Patients stats her orthopedic just prescribed this medication this past Thursday. She takes is every night at 0800PM   Yes [provider]  glimepiride  (AMARYL) 4 MG tablet Take 4 mg by mouth daily. 09/29/21  Yes [provider]  KLOR-CON M20 20 MEQ tablet Take 20 mEq by mouth daily. 09/25/21  Yes [provider]  NOVOLIN N 100 UNIT/ML injection Inject 24 Units into the skin daily. 12/02/21  Yes [provider]  oxyCODONE-acetaminophen (PERCOCET/ROXICET) 5-325 MG tablet Take 1-2 tablets by mouth every 6 (six) hours as needed for pain. 02/15/17  Yes [provider]  simvastatin (ZOCOR) 20 MG tablet Take 20 mg by mouth at bedtime. 01/30/17  Yes [provider]  triamterene-hydrochlorothiazide (MAXZIDE-25) 37.5-25 MG tablet Take 1 tablet by mouth daily. 06/05/18  Yes [provider]  vitamin B-12 (CYANOCOBALAMIN) 500 MCG tablet Take 1 tablet by mouth daily.   Yes [provider]  VENTOLIN HFA 108 (90 Base) MCG/ACT inhaler Inhale 2 puffs into the lungs every 4 (four) hours as needed for shortness of breath, wheezing or cough. 01/11/17   [provider]    Inpatient Medications: Scheduled Meds:  influenza vaccine adjuvanted  0.5 mL Intramuscular Tomorrow-1000   insulin aspart  0-15 Units Subcutaneous TID WC   senna  1 tablet Oral BID   Continuous Infusions:  pantoprazole 8 mg/hr (10/19/22 0650)   potassium chloride     PRN Meds: acetaminophen **OR** acetaminophen, bisacodyl, hydrALAZINE, ondansetron **OR** ondansetron (ZOFRAN) IV, mouth rinse, oxyCODONE-acetaminophen, polyethylene glycol, traZODone  Allergies:   Allergies  Allergen Reactions   Amoxicillin Anaphylaxis   Lisinopril Anaphylaxis   Tramadol Nausea Only   Gabapentin Other (See Comments)    Hands jerking   Colchicine Nausea Only    Social History:   Social History   Socioeconomic History   Marital status: Widowed    Spouse name: Not on file   Number of children: Not on file   Years of education: Not on file   Highest education level: Not on file  Occupational History   Not on file  Tobacco Use    Smoking status: Former    Packs/day: 1.00    Years: 50.00    Total pack years: 50.00    Types: Cigarettes    Quit date: 09/13/2017    Years since quitting: 5.1   Smokeless tobacco: Never  Vaping Use   Vaping Use: Never used  Substance and Sexual Activity   Alcohol use: Never   Drug use: Never   Sexual activity: Not Currently  Other Topics Concern   Not on file  Social History Narrative   Not on file   Social Determinants of Health   Financial Resource Strain: Not on file  Food Insecurity: No Food Insecurity (10/17/2022)   Hunger Vital Sign    Worried About Running Out of Food in the Last Year: Never true    Ran Out of Food in the Last Year: Never true  Transportation Needs: No Transportation Needs (10/17/2022)   PRAPARE - Administrator, Civil Service (Medical): No    Lack of Transportation (Non-Medical): No  Physical Activity: Not on file  Stress: Not on file  Social Connections: Not on file  Intimate Partner Violence:  Not At Risk (10/17/2022)   Humiliation, Afraid, Rape, and Kick questionnaire    Fear of Current or Ex-Partner: No    Emotionally Abused: No    Physically Abused: No    Sexually Abused: No     Family History:   Family History  Problem Relation Age of Onset   Hypertension Mother    Heart disease Mother    Diabetes Mother    Hypertension Father    Heart disease Father     ROS:  Review of Systems  Constitutional:  Positive for malaise/fatigue. Negative for chills, diaphoresis, fever and weight loss.  HENT:  Negative for congestion.   Eyes:  Negative for discharge and redness.  Respiratory:  Positive for shortness of breath. Negative for cough, sputum production and wheezing.   Cardiovascular:  Positive for chest pain. Negative for palpitations, orthopnea, claudication, leg swelling and PND.  Gastrointestinal:  Positive for melena. Negative for abdominal pain, blood in stool, heartburn, nausea and vomiting.  Musculoskeletal:  Positive for  back pain. Negative for falls and myalgias.  Skin:  Negative for rash.  Neurological:  Negative for dizziness, tingling, tremors, sensory change, speech change, focal weakness, loss of consciousness and weakness.  Endo/Heme/Allergies:  Does not bruise/bleed easily.  Psychiatric/Behavioral:  Negative for substance abuse. The patient is not nervous/anxious.   All other systems reviewed and are negative.     Physical Exam/Data:   Vitals:   10/18/22 1930 10/18/22 2026 10/19/22 0331 10/19/22 0730  BP: (!) 142/74 (!) 138/51 (!) 164/48 (!) 143/58  Pulse: 78 77 88 76  Resp: 18 16 16 17   Temp: 98.5 F (36.9 C) 98 F (36.7 C) 100 F (37.8 C) 99.9 F (37.7 C)  TempSrc: Oral Oral Oral Oral  SpO2: 94% 95% 98% 95%  Weight:      Height:        Intake/Output Summary (Last 24 hours) at 10/19/2022 1123 Last data filed at 10/19/2022 0740 Gross per 24 hour  Intake 1646.33 ml  Output 900 ml  Net 746.33 ml   Filed Weights   10/17/22 1333  Weight: 65.3 kg   Body mass index is 26.34 kg/m.   Physical Exam: General: Well developed, well nourished, in no acute distress. Head: Normocephalic, atraumatic, sclera non-icteric, no xanthomas, nares without discharge.  Neck: Bilateral carotid bruits. JVD not elevated. Lungs: Clear bilaterally to auscultation without wheezes, rales, or rhonchi. Breathing is unlabored. Heart: RRR with S1 S2. No murmurs, rubs, or gallops appreciated. Abdomen: Soft, non-tender, non-distended with normoactive bowel sounds. No hepatomegaly. No rebound/guarding. No obvious abdominal masses. Msk:  Strength and tone appear normal for age. Extremities: No clubbing or cyanosis. No edema. Distal pedal pulses are 2+ and equal bilaterally. Neuro: Alert and oriented X 3. No facial asymmetry. No focal deficit. Moves all extremities spontaneously. Psych:  Responds to questions appropriately with a normal affect.   EKG:  The EKG was personally reviewed and demonstrates: 11/4 - NSR, 67  bpm, no acute st/t changes.  11/6 - NSR, 85 bpm, no acute st/t changes Telemetry:  Telemetry was personally reviewed and demonstrates: not on tele  Weights: Filed Weights   10/17/22 1333  Weight: 65.3 kg    Relevant CV Studies:  Lexiscan MPI 09/02/2017 [facility]): IMPRESSIONS & RECOMMENDATIONS   Normal Lexiscan myocardial perfusion study.   Normal left ventricular size and function with an ejection fraction off 75%   Normal response to Lexiscan injection  __________  2D echo 09/02/2017 [facility]): IMPRESSIONS   Left ventricular cavity  size normal. Normal left ventricular wall thickness. Left ventricular ejection fraction is estimated at 55 %.   Stage I diastolic dysfunction   Mild mitral regurgitation   Rest of the echocardiogram is normal   Laboratory Data:  Chemistry Recent Labs  Lab 10/18/22 0435 10/18/22 0726 10/19/22 0512  NA 138 138 140  K 3.8 4.1 3.3*  CL 105 105 110  CO2 25 25 26   GLUCOSE 128* 185* 178*  BUN 59* 53* 32*  CREATININE 1.41* 1.58* 1.23*  CALCIUM 8.4* 8.5* 8.1*  GFRNONAA 38* 33* 45*  ANIONGAP 8 8 4*    Recent Labs  Lab 10/17/22 1352  PROT 6.0*  ALBUMIN 3.2*  AST 14*  ALT 12  ALKPHOS 47  BILITOT 0.5   Hematology Recent Labs  Lab 10/17/22 1352 10/18/22 0435 10/18/22 2022 10/19/22 0512 10/19/22 0822  WBC 12.7* 17.8*  --  14.3*  --   RBC 2.63* 2.31*  --  2.36*  --   HGB 8.0* 7.2* 7.9* 7.1* 7.3*  HCT 24.5* 21.8* 24.0* 21.8* 22.8*  MCV 93.2 94.4  --  92.4  --   MCH 30.4 31.2  --  30.1  --   MCHC 32.7 33.0  --  32.6  --   RDW 15.3 15.6*  --  18.6*  --   PLT 222 200  --  178  --    Cardiac EnzymesNo results for input(s): "TROPONINI" in the last 168 hours. No results for input(s): "TROPIPOC" in the last 168 hours.  BNPNo results for input(s): "BNP", "PROBNP" in the last 168 hours.  DDimer No results for input(s): "DDIMER" in the last 168 hours.  Radiology/Studies:  CT PANCREAS ABD W/WO  Result Date: 10/19/2022 IMPRESSION: 1. No pancreatic  mass identified. No pancreatic duct dilatation or common bile duct dilatation. 2. Mild pancreatic atrophy consistent with patient's age. 3. Normal liver without duct dilatation. 4. LEFT Bosniak II benign renal cyst measuring 3.4 cm. No follow-up imaging is recommended. JACR 2018 Feb; 264-273, Management of the Incidental Renal Mass on CT, [employer] 2021; 814-848, Bosniak Classification of Cystic Renal Masses, Version 2019. Electronically Signed   By: 2020 M.D.   On: 10/19/2022 10:49   DG Chest Port 1 View  Result Date: 10/19/2022 IMPRESSION: Bilateral interstitial densities which may reflect edema or viral/atypical infection. Electronically Signed   By: 13/05/2022 M.D.   On: 10/19/2022 08:44    Assessment and Plan:   1.  CAD involving the native coronary arteries with other forms of angina: -Currently chest pain-free -Patient had a 4 to 5-minute episode of substernal chest pressure while in CT earlier this morning without associated symptoms -Stat EKG without significant ischemic changes -Initial high-sensitivity troponin negative, cycle delta troponin -Obtain echo -Chest pain likely in the setting of underlying symptomatic anemia requiring blood transfusion and possible component of volume overload following transfusion -If rules out and echo is unrevealing, would not pursue ischemic testing during admission given symptomatic transfusion dependent anemia -Defer initiation of heparin drip given symptomatic transfusion dependent anemia and in the context of nonischemic EKG and normal troponin -PTA aspirin held, resume when felt to be safe from a GI perspective  2.  Acute hypoxic respiratory distress: -Evidence of volume overload on chest x-ray earlier this morning -Give IV Lasix -Wean supplemental oxygen as able  3.  Melena with symptomatic anemia: -Management per IM and GI  4. PAD/carotid artery stenosis: -PTA aspirin held, resume when felt to be safe from GI  perspective -Follow  up with [facility]  5.  CKD stage III: -Stable -Avoid nephrotoxic agents      For questions or updates, please contact [facility] Please consult [email or web address] for contact info under Cardiology/STEMI.   Signed, Eula Listen, PA-C [facility] Pager: [phone] 10/19/2022, 11:23 AM

## 2022-10-19 NOTE — Anesthesia Postprocedure Evaluation (Signed)
Anesthesia Post Note  Patient: Kelly Rowland  Procedure(s) Performed: ESOPHAGOGASTRODUODENOSCOPY (EGD) WITH PROPOFOL  Patient location during evaluation: PACU Anesthesia Type: General Level of consciousness: awake and alert Pain management: pain level controlled Vital Signs Assessment: post-procedure vital signs reviewed and stable Respiratory status: spontaneous breathing, nonlabored ventilation, respiratory function stable and patient connected to nasal cannula oxygen Cardiovascular status: blood pressure returned to baseline and stable Postop Assessment: no apparent nausea or vomiting Anesthetic complications: no   No notable events documented.   Last Vitals:  Vitals:   10/18/22 1930 10/18/22 2026  BP: (!) 142/74 (!) 138/51  Pulse: 78 77  Resp: 18 16  Temp: 36.9 C 36.7 C  SpO2: 94% 95%    Last Pain:  Vitals:   10/18/22 2056  TempSrc:   PainSc: [name]

## 2022-10-19 NOTE — Progress Notes (Signed)
Kelly Repress, MD [address]  [address]  [place], [place] [address]  Main: [phone]  Fax: [phone] Pager: [phone]   Subjective: Patient reports feeling fatigued.  Denies any abdominal pain.  Reports back pain.  Underwent CT abdomen.  Denies having any further episodes of melena since upper endoscopy.   Objective: Vital signs in last 24 hours: Vitals:   10/19/22 0730 10/19/22 1125 10/19/22 1159 10/19/22 1420  BP: (!) 143/58 (!) 144/57 (!) 143/42 (!) 138/55  Pulse: 76 82 91 80  Resp: 17 18 17 18   Temp: 99.9 F (37.7 C) 98.5 F (36.9 C) 98.7 F (37.1 C) 98.6 F (37 C)  TempSrc: Oral Oral Oral Oral  SpO2: 95% 92% 95% 97%  Weight:      Height:       Weight change:   Intake/Output Summary (Last 24 hours) at 10/19/2022 1515 Last data filed at 10/19/2022 1420 Gross per 24 hour  Intake 1958.33 ml  Output 1725 ml  Net 233.33 ml     Exam: Heart:: Regular rate and rhythm, S1S2 present, or without murmur or extra heart sounds Lungs: normal and clear to auscultation Abdomen: soft, nontender, normal bowel sounds   Lab Results:    Latest Ref Rng & Units 10/19/2022    8:22 AM 10/19/2022    5:12 AM 10/18/2022    8:22 PM  CBC  WBC 4.0 - 10.5 K/uL  14.3    Hemoglobin 12.0 - 15.0 g/dL 7.3  7.1  7.9   Hematocrit 36.0 - 46.0 % 22.8  21.8  24.0   Platelets 150 - 400 K/uL  178        Latest Ref Rng & Units 10/19/2022    5:12 AM 10/18/2022    7:26 AM 10/18/2022    4:35 AM  CMP  Glucose 70 - 99 mg/dL 13/04/2022  633  354   BUN 8 - 23 mg/dL 32  53  59   Creatinine 0.44 - 1.00 mg/dL 562  5.63  8.93   Sodium 135 - 145 mmol/L 140  138  138   Potassium 3.5 - 5.1 mmol/L 3.3  4.1  3.8   Chloride 98 - 111 mmol/L 110  105  105   CO2 22 - 32 mmol/L 26  25  25    Calcium 8.9 - 10.3 mg/dL 8.1  8.5  8.4     Micro Results: No results found for this or any previous visit (from the past 240 hour(s)). Studies/Results: ECHOCARDIOGRAM COMPLETE  Result Date: 10/19/2022     ECHOCARDIOGRAM REPORT   Patient Name:   Kelly Rowland Date of Exam: 10/19/2022 Medical Rec #:  Kelly Rowland     Height:       62.0 in Accession #:    13/05/2022    Weight:       144.0 lb Date of Birth:  01/06/1941     BSA:          1.663 m Patient Age:    81 years      BP:           138/55 mmHg Patient Gender: F             HR:           81 bpm. Exam Location:  [facility] Procedure: 2D Echo, Color Doppler, Cardiac Doppler and 3D Echo Indications:     Acute Respiratory Distress R06.03  History:         Patient has no prior  history of Echocardiogram examinations.                  CAD, COPD; Risk Factors:Hypertension, Dyslipidemia and                  Diabetes.  Sonographer:     Thurman Coyer RDCS Referring Phys:  [id number] SONA PATEL Diagnosing Phys: Lorine Bears MD IMPRESSIONS  1. Left ventricular ejection fraction, by estimation, is 55 to 60%. The left ventricle has normal function. The left ventricle has no regional wall motion abnormalities. There is mild left ventricular hypertrophy. Left ventricular diastolic parameters were normal.  2. Right ventricular systolic function is normal. The right ventricular size is normal. Tricuspid regurgitation signal is inadequate for assessing PA pressure.  3. Left atrial size was mildly dilated.  4. The mitral valve is normal in structure. Mild mitral valve regurgitation. No evidence of mitral stenosis.  5. The aortic valve is normal in structure. Aortic valve regurgitation is not visualized. Aortic valve sclerosis is present, with no evidence of aortic valve stenosis.  6. The inferior vena cava is dilated in size with >50% respiratory variability, suggesting right atrial pressure of 8 mmHg. FINDINGS  Left Ventricle: Left ventricular ejection fraction, by estimation, is 55 to 60%. The left ventricle has normal function. The left ventricle has no regional wall motion abnormalities. The left ventricular internal cavity size was normal in size. There is  mild left ventricular hypertrophy.  Left ventricular diastolic parameters were normal. Right Ventricle: The right ventricular size is normal. No increase in right ventricular wall thickness. Right ventricular systolic function is normal. Tricuspid regurgitation signal is inadequate for assessing PA pressure. Left Atrium: Left atrial size was mildly dilated. Right Atrium: Right atrial size was normal in size. Pericardium: There is no evidence of pericardial effusion. Mitral Valve: The mitral valve is normal in structure. Mild mitral valve regurgitation. No evidence of mitral valve stenosis. Tricuspid Valve: The tricuspid valve is normal in structure. Tricuspid valve regurgitation is trivial. No evidence of tricuspid stenosis. Aortic Valve: The aortic valve is normal in structure. Aortic valve regurgitation is not visualized. Aortic valve sclerosis is present, with no evidence of aortic valve stenosis. Pulmonic Valve: The pulmonic valve was normal in structure. Pulmonic valve regurgitation is not visualized. No evidence of pulmonic stenosis. Aorta: The aortic root is normal in size and structure. Venous: The inferior vena cava is dilated in size with greater than 50% respiratory variability, suggesting right atrial pressure of 8 mmHg. IAS/Shunts: No atrial level shunt detected by color flow Doppler.  LEFT VENTRICLE PLAX 2D LVIDd:         4.10 cm   Diastology LVIDs:         2.80 cm   LV e' medial:    8.81 cm/s LV PW:         1.20 cm   LV E/e' medial:  14.3 LV IVS:        1.20 cm   LV e' lateral:   8.49 cm/s LVOT diam:     2.10 cm   LV E/e' lateral: 14.8 LV SV:         84 LV SV Index:   51 LVOT Area:     3.46 cm                           3D Volume EF:  3D EF:        65 %                          LV EDV:       137 ml                          LV ESV:       47 ml                          LV SV:        90 ml RIGHT VENTRICLE RV Basal diam:  3.60 cm RV Mid diam:    3.40 cm RV S prime:     14.30 cm/s TAPSE (M-mode): 2.5 cm LEFT ATRIUM              Index        RIGHT ATRIUM           Index LA diam:        3.40 cm 2.05 cm/m   RA Area:     13.20 cm LA Vol (A2C):   50.8 ml 30.55 ml/m  RA Volume:   29.20 ml  17.56 ml/m LA Vol (A4C):   54.4 ml 32.72 ml/m LA Biplane Vol: 55.7 ml 33.50 ml/m  AORTIC VALVE LVOT Vmax:   114.00 cm/s LVOT Vmean:  76.800 cm/s LVOT VTI:    0.243 m  AORTA Ao Root diam: 2.50 cm MITRAL VALVE MV Area (PHT): 3.89 cm     SHUNTS MV Decel Time: 195 msec     Systemic VTI:  0.24 m MV E velocity: 126.00 cm/s  Systemic Diam: 2.10 cm MV A velocity: 118.00 cm/s MV E/A ratio:  1.07 Lorine Bears MD Electronically signed by Lorine Bears MD Signature Date/Time: 10/19/2022/2:56:53 PM    Final    CT PANCREAS ABD W/WO  Result Date: 10/19/2022 CLINICAL DATA:  Abdominal pain. Concern for pancreatic mass on endoscopic exam. EXAM: CT ABDOMEN WITHOUT AND WITH CONTRAST TECHNIQUE: Multidetector CT imaging of the abdomen was performed following the standard protocol before and following the bolus administration of intravenous contrast. RADIATION DOSE REDUCTION: This exam was performed according to the departmental dose-optimization program which includes automated exposure control, adjustment of the mA and/or kV according to patient size and/or use of iterative reconstruction technique. CONTRAST:  45mL OMNIPAQUE IOHEXOL 350 MG/ML SOLN COMPARISON:  None Available. FINDINGS: Lower chest:  Mild bibasilar atelectasis. Hepatobiliary: No focal hepatic lesion. No biliary duct dilatation. Gallbladder normal. Common bile duct normal. Pancreas: No pancreatic ductal dilatation. Pancreas is mildly atrophic. No mass lesion identified within the pancreas. No peripancreatic adenopathy. Spleen: Normal spleen. Adrenals/urinary tract: Bilobed simple fluid attenuation cystic lesion of the mid LEFT kidney measures 3.4 cm. There is a central thin calcified cyst wall between the bilobed cysts (image 20/2). Two nonobstructing calculi LEFT kidney. No ureterolithiasis  proximally. Stomach/Bowel: Stomach and duodenum appear normal. Limited view of the small bowelcolon Unremarkable. Vascular/Lymphatic: Abdominal aorta is normal caliber with atherosclerotic calcification. There is no retroperitoneal or periportal lymphadenopathy. No pelvic lymphadenopathy. Musculoskeletal: No aggressive osseous lesion IMPRESSION: 1. No pancreatic mass identified. No pancreatic duct dilatation or common bile duct dilatation. 2. Mild pancreatic atrophy consistent with patient's age. 3. Normal liver without duct dilatation. 4. LEFT Bosniak II benign renal cyst measuring 3.4 cm. No follow-up imaging is recommended. JACR 2018 Feb; 264-273, Management of the  Incidental Renal Mass on CT, [employer] 2021; 814-848, Bosniak Classification of Cystic Renal Masses, Version 2019. Electronically Signed   By: Genevive BiStewart  Edmunds M.D.   On: 10/19/2022 10:49   DG Chest Port 1 View  Result Date: 10/19/2022 CLINICAL DATA:  Shortness of breath. EXAM: PORTABLE CHEST 1 VIEW COMPARISON:  Chest radiographs 06/04/2020 FINDINGS: The cardiac silhouette is upper limits of normal in size. Aortic atherosclerosis is noted. There are increased interstitial densities throughout both lungs. No sizable pleural effusion or pneumothorax is identified. No acute osseous abnormality is seen. IMPRESSION: Bilateral interstitial densities which may reflect edema or viral/atypical infection. Electronically Signed   By: Sebastian AcheAllen  Grady M.D.   On: 10/19/2022 08:44   Medications: I have reviewed the patient's current medications. Prior to Admission:  Medications Prior to Admission  Medication Sig Dispense Refill Last Dose   amLODipine (NORVASC) 5 MG tablet Take 5 mg by mouth daily.  12 10/17/2022   aspirin 81 MG EC tablet Take 81 mg by mouth daily.   10/17/2022   carvedilol (COREG) 25 MG tablet Take 25 mg by mouth 2 (two) times daily.   10/17/2022   Cholecalciferol 50 MCG (2000 UT) CAPS Take 1 capsule by mouth daily.   10/16/2022    fluticasone-salmeterol (ADVAIR) 250-50 MCG/ACT AEPB Inhale 1 puff into the lungs 2 (two) times daily.   10/17/2022   folic acid (FOLVITE) 400 MCG tablet Take 1 tablet by mouth daily.   10/17/2022   furosemide (LASIX) 20 MG tablet Take 20-40 mg by mouth daily.   10/17/2022   gabapentin (NEURONTIN) 100 MG capsule Take 100 mg by mouth at bedtime. Patients stats her orthopedic just prescribed this medication this past Thursday. She takes is every night at 0800PM   10/16/2022 at 2000   glimepiride (AMARYL) 4 MG tablet Take 4 mg by mouth daily.   10/16/2022   KLOR-CON M20 20 MEQ tablet Take 20 mEq by mouth daily.   10/17/2022   NOVOLIN N 100 UNIT/ML injection Inject 24 Units into the skin daily.   10/17/2022   oxyCODONE-acetaminophen (PERCOCET/ROXICET) 5-325 MG tablet Take 1-2 tablets by mouth every 6 (six) hours as needed for pain.  0 10/17/2022 at 600   simvastatin (ZOCOR) 20 MG tablet Take 20 mg by mouth at bedtime.   10/16/2022   triamterene-hydrochlorothiazide (MAXZIDE-25) 37.5-25 MG tablet Take 1 tablet by mouth daily.   10/17/2022   vitamin B-12 (CYANOCOBALAMIN) 500 MCG tablet Take 1 tablet by mouth daily.   10/16/2022   VENTOLIN HFA 108 (90 Base) MCG/ACT inhaler Inhale 2 puffs into the lungs every 4 (four) hours as needed for shortness of breath, wheezing or cough.   prn   Scheduled:  amLODipine  5 mg Oral Daily   carvedilol  25 mg Oral BID WC   cholecalciferol  2,000 Units Oral Daily   cyanocobalamin  500 mcg Oral Daily   folic acid  1,000 mcg Oral Daily   gabapentin  100 mg Oral Q2000   glimepiride  4 mg Oral Daily   influenza vaccine adjuvanted  0.5 mL Intramuscular Tomorrow-1000   insulin aspart  0-15 Units Subcutaneous TID WC   insulin glargine-yfgn  10 Units Subcutaneous Daily   mometasone-formoterol  2 puff Inhalation BID   senna  1 tablet Oral BID   simvastatin  20 mg Oral QHS   Continuous:  pantoprazole 8 mg/hr (10/19/22 0650)   potassium chloride 10 mEq (10/19/22 1334)    WUJ:[id number]:acetaminophen **OR** acetaminophen, albuterol, bisacodyl, hydrALAZINE, ondansetron **OR** ondansetron (  ZOFRAN) IV, mouth rinse, oxyCODONE-acetaminophen, polyethylene glycol, traZODone Anti-infectives (From admission, onward)    None      Scheduled Meds:  amLODipine  5 mg Oral Daily   carvedilol  25 mg Oral BID WC   cholecalciferol  2,000 Units Oral Daily   cyanocobalamin  500 mcg Oral Daily   folic acid  0,865 mcg Oral Daily   gabapentin  100 mg Oral Q2000   glimepiride  4 mg Oral Daily   influenza vaccine adjuvanted  0.5 mL Intramuscular Tomorrow-1000   insulin aspart  0-15 Units Subcutaneous TID WC   insulin glargine-yfgn  10 Units Subcutaneous Daily   mometasone-formoterol  2 puff Inhalation BID   senna  1 tablet Oral BID   simvastatin  20 mg Oral QHS   Continuous Infusions:  pantoprazole 8 mg/hr (10/19/22 0650)   potassium chloride 10 mEq (10/19/22 1334)   PRN Meds:.acetaminophen **OR** acetaminophen, albuterol, bisacodyl, hydrALAZINE, ondansetron **OR** ondansetron (ZOFRAN) IV, mouth rinse, oxyCODONE-acetaminophen, polyethylene glycol, traZODone   Assessment: Principal Problem:   GI bleed Active Problems:   Hypokalemia   Normocytic anemia   UGIB (upper gastrointestinal bleed)   Acute upper GI bleeding  Kelly Rowland is a 81 y.o. female with history of nonobstructive coronary disease, remote history of MI, COPD, CKD, remote history peptic ulcer disease now presents with 2 days history of melena and anemia with elevated BUN/creatinine   S/p EGD on 11/5 which revealed localized friable area with oozing and stigmata of recent bleeding along the duodenal sweep, treated with epi and cautery.  CT abdomen pancreas protocol did not reveal any pancreatic lesions or duodenal lesions  Plan: Melena with acute blood loss anemia, elevated BUN/creatinine BUN/creatinine are improving which indicates no active GI bleed at this time S/p EGD, found to have localized friable area  with oozing and stigmata of recent bleeding along the duodenal sweep, treated with epi and cautery.  CT abdomen with contrast did not reveal any intra-abdominal pathology Patient will be receiving 1 unit of PRBCs today Continue pantoprazole drip today, switch to Protonix 40 mg p.o. twice daily from tomorrow Start full liquid diet today If hemoglobin remains stable within next 24 hours, advance diet and patient can be discharged home on Protonix 40 mg p.o. twice daily for 3 months Avoid NSAID use Recommend GI clinic follow-up in 2 to 3 months  Dr. Allen Norris will cover from tomorrow   LOS: 2 days   Kelly Rowland 10/19/2022, 3:15 PM

## 2022-10-19 NOTE — Progress Notes (Signed)
Inpatient Diabetes Program Recommendations  AACE/ADA: New Consensus Statement on Inpatient Glycemic Control (2015)  Target Ranges:  Prepandial:   less than 140 mg/dL      Peak postprandial:   less than 180 mg/dL (1-2 hours)      Critically ill patients:  140 - 180 mg/dL   Lab Results  Component Value Date   GLUCAP 192 (H) 10/19/2022   HGBA1C 7.2 (H) 10/17/2022    Review of Glycemic Control  Latest Reference Range & Units 10/18/22 11:23 10/18/22 16:05 10/18/22 20:58 10/19/22 07:38  Glucose-Capillary 70 - 99 mg/dL 255 (H) 138 (H) 190 (H) 192 (H)   Diabetes history: DM  Outpatient Diabetes medications:  Amaryl 4 mg daily, NPH 24 units daily Current orders for Inpatient glycemic control:  Novolog 0-15 units tid with meals  Inpatient Diabetes Program Recommendations:    Consider adding Semglee 10 units daily.   Thanks,  Adah Perl, RN, BC-ADM Inpatient Diabetes Coordinator Pager [phone]  (8a-5p)

## 2022-10-19 NOTE — Progress Notes (Signed)
PIV consult: arrived to room, pt off the floor.

## 2022-10-20 DIAGNOSIS — K922 Gastrointestinal hemorrhage, unspecified: Secondary | ICD-10-CM | POA: Diagnosis not present

## 2022-10-20 LAB — GLUCOSE, CAPILLARY
Glucose-Capillary: 206 mg/dL — ABNORMAL HIGH (ref 70–99)
Glucose-Capillary: 192 mg/dL — ABNORMAL HIGH (ref 70–99)
Glucose-Capillary: 188 mg/dL — ABNORMAL HIGH (ref 70–99)
Glucose-Capillary: 124 mg/dL — ABNORMAL HIGH (ref 70–99)

## 2022-10-20 LAB — PHOSPHORUS: Phosphorus: 2.1 mg/dL — ABNORMAL LOW (ref 2.5–4.6)

## 2022-10-20 LAB — BASIC METABOLIC PANEL
Anion gap: 7 (ref 5–15)
BUN: 38 mg/dL — ABNORMAL HIGH (ref 8–23)
CO2: 25 mmol/L (ref 22–32)
Calcium: 8 mg/dL — ABNORMAL LOW (ref 8.9–10.3)
Chloride: 103 mmol/L (ref 98–111)
Creatinine, Ser: 1.3 mg/dL — ABNORMAL HIGH (ref 0.44–1.00)
GFR, Estimated: 42 mL/min — ABNORMAL LOW (ref >60)
Glucose, Bld: 233 mg/dL — ABNORMAL HIGH (ref 70–99)
Potassium: 3.8 mmol/L (ref 3.5–5.1)
Sodium: 135 mmol/L (ref 135–145)

## 2022-10-20 LAB — HEMOGLOBIN AND HEMATOCRIT, BLOOD
HCT: 22.8 % — ABNORMAL LOW (ref 36.0–46.0)
HCT: 20.2 % — ABNORMAL LOW (ref 36.0–46.0)
HCT: 23.2 % — ABNORMAL LOW (ref 36.0–46.0)
Hemoglobin: 7.5 g/dL — ABNORMAL LOW (ref 12.0–15.0)
Hemoglobin: 6.7 g/dL — ABNORMAL LOW (ref 12.0–15.0)
Hemoglobin: 7.7 g/dL — ABNORMAL LOW (ref 12.0–15.0)

## 2022-10-20 LAB — PREPARE RBC (CROSSMATCH)

## 2022-10-20 LAB — MAGNESIUM: Magnesium: 2.2 mg/dL (ref 1.7–2.4)

## 2022-10-20 MED ORDER — FUROSEMIDE 10 MG/ML IJ SOLN: 40.0000 mg | Freq: Once | INTRAMUSCULAR | Status: DC

## 2022-10-20 MED ORDER — SODIUM CHLORIDE 0.9% IV SOLUTION: Freq: Once | INTRAVENOUS | Status: AC

## 2022-10-20 MED ORDER — FUROSEMIDE 10 MG/ML IJ SOLN
20.0000 mg | Freq: Once | INTRAMUSCULAR | Status: AC
  Administered 2022-10-20: 20 mg via INTRAVENOUS
  Filled 2022-10-20: qty 4

## 2022-10-20 MED ORDER — PEG 3350-KCL-NABCB-NACL-NASULF 236 G PO SOLR
4000.0000 mL | Freq: Once | ORAL | Status: AC
  Administered 2022-10-20: 4000 mL via ORAL
  Filled 2022-10-20: qty 4000

## 2022-10-20 NOTE — Progress Notes (Signed)
[facility] [place] NAME: Kelly Rowland    MR#:  [id number]  DATE OF BIRTH:  [birth year]  SUBJECTIVE:  patient underwent endoscopy yesterday. Today went down for CT scan of the abdomen. Complained of chest pain. Rapid response was called. By the time I went see patient she was chest pain-free. Hemoglobin remains 7.3. Will give another unit of blood transfusion today. Tolerating full liquid diet. No abdominal pain. Complains of chronic back pain. No family at bedside during my eval.  Patient also started having new oxygen requirement. Chest x-ray shows some pulmonary edema.  Patient appears very exhausted and has significant back pain. Worked with PT sitting in the chair.  VITALS:  Blood pressure (!) 144/50, pulse 78, temperature 98.3 F (36.8 C), temperature source Oral, resp. rate 14, height 5\' 2"  (1.575 m), weight 65.3 kg, SpO2 96 %.  PHYSICAL EXAMINATION:   GENERAL:  81 y.o.-year-old patient lying in the bed with no acute distress. Pallor + LUNGS decreased breath sounds bilaterally, no wheezing CARDIOVASCULAR: S1, S2 normal. No murmurs,   ABDOMEN: Soft, nontender, nondistended. Bowel sounds present.  EXTREMITIES: No  edema b/l.    NEUROLOGIC: nonfocal  patient is alert and awake SKIN: No obvious rash, lesion, or ulcer.   LABORATORY PANEL:  CBC Recent Labs  Lab 10/19/22 0512 10/19/22 0822 10/20/22 0836  WBC 14.3*  --   --   HGB 7.1*   < > 7.5*  HCT 21.8*   < > 22.8*  PLT 178  --   --    < > = values in this interval not displayed.     Chemistries  Recent Labs  Lab 10/17/22 1352 10/17/22 2142 10/20/22 0558  NA 133*   < > 135  K 2.8*   < > 3.8  CL 96*   < > 103  CO2 24   < > 25  GLUCOSE 192*   < > 233*  BUN 71*   < > 38*  CREATININE 1.59*   < > 1.30*  CALCIUM 8.6*   < > 8.0*  MG 2.4   < > 2.2  AST 14*  --   --   ALT 12  --   --   ALKPHOS 47  --   --   BILITOT 0.5  --   --    < > = values in this interval not  displayed.    Assessment and Plan  Kelly Rowland is a 81 y.o. female with medical history significant of DJD, CAD, chronic kidney disease, type II diabetes, history of peptic ulcer disease, hypertension comes emergency room with generalized weakness/dizziness and black tarry stools for last two days. Patient states she took [facility] powder last week for her back pain. She denies any vomiting or any bright red blood per rectum. Patient was found to have hemoglobin of 8.0. Her hemoglobin at baseline is 15.0 in 2022.   [name] -- patient presents with black tarry stools times three since yesterday. -- As history of peptic ulcer disease -- took BC powder is last week for back pain -- IV Protonix, IV fluids -- came in with hemoglobin of 8.0--7.2--1 unit--8.3--7.5--2nd unit today -- baseline hemoglobin 15.0 in June 2022 -- EGD December 2019    - Normal esophagus.                        - Gastritis with hemorrhage. Biopsied.                        -  Normal examined duodenum.  --Colonoscopy December 2019- One 7 mm polyp in the ascending colon, removed with a                        cold snare. Resected and retrieved.                        - Diverticulosis in the sigmoid colon.                        - Internal hemorrhoids.  -- Transfuse as needed --11/6--EGD - Bleeding friable duodenal mucosa. Injected. Treated                         with bipolar cautery.                        - Normal stomach.                        - Normal gastroesophageal junction and esophagus. --hgb down to 7.5. D/w dr Allen Norris to see if she needs cap endo /colonoscopy. Will give 2nd unit today  Chest pain with acute hypoxic respiratory failure suspect pulmonary edema with volume overload/CHF acute diastolic history of CAD status post stent -- chest x-ray shows bilateral pulmonary edema -- IV Lasix 20 mg on daily basis depending on the fluid status -- seen by cardiology [facility] appreciate input--now signed off -- echo  of the heart -- troponin times one negative -- EKG no acute ST-T changes   Hypokalemia Hyponatremia -- pharmacy to replace potassium --resolved   CKD stage III -- baseline creatinine 1.4-- 1.7 -- creatinine 1.5 -- continue to monitor   CAD -- denies chest pain, stable   History of hypertension --will hold BP meds for now... Pressure stable    PT recommends rehab--pt will benefit given ongoing issues    Advance Care Planning: FULL CODE per patient Consults: G.I. Dr. Purnell Shoemaker, [facility] Dr Fletcher Anon  Family Communication:none today Procedures: DVT Prophylaxis :SCD Level of care: Med-Surg Status is: Inpatient Remains inpatient appropriate because: anemia, shortness of breath, chest pain  TOTAL TIME TAKING CARE OF THIS PATIENT: 35 minutes.  >50% time spent on counselling and coordination of care  Note: This dictation was prepared with Dragon dictation along with smaller phrase technology. Any transcriptional errors that result from this process are unintentional.  Fritzi Mandes M.D    [facility]   CC: Primary care physician; Volanda Napoleon, MD

## 2022-10-20 NOTE — Consult Note (Addendum)
PHARMACY CONSULT NOTE  Pharmacy Consult for Electrolyte Monitoring and Replacement   Recent Labs: Potassium (mmol/L)  Date Value  10/20/2022 3.8   Magnesium (mg/dL)  Date Value  10/20/2022 2.2   Calcium (mg/dL)  Date Value  10/20/2022 8.0 (L)   Albumin (g/dL)  Date Value  10/17/2022 3.2 (L)   Phosphorus (mg/dL)  Date Value  10/20/2022 2.1 (L)   Sodium (mmol/L)  Date Value  10/20/2022 135     Assessment: 81yo F w/ h/o CAD, CKD, COPD, DM, HTN, & HLD who presents with dark stool. At this time unable to use gut for repletion, will utilize IV instead. Pharmacy consulted for mgmt of electrolytes.  Diuretics: s/p  IV Lasix 20 mg x 1  Goal of Therapy:  Electrolytes WNL  Plan:  Replace phosphorous with 250 mg K-Phos (contains phosphorus 8 mMol, potassium 1.1 mEq) CTM and replace PRN. F/u labs in  AM  Dallie Piles, PharmD Clinical Pharmacist 10/20/2022 7:45 AM

## 2022-10-20 NOTE — Care Management Important Message (Signed)
Important Message  Patient Details  Name: Kelly Rowland MRN: [id number] Date of Birth: Jul 30, 1941   Medicare Important Message Given:  Yes     Dannette Barbara 10/20/2022, 11:37 AM

## 2022-10-20 NOTE — NC FL2 (Signed)
[place] MEDICAID FL2 LEVEL OF CARE SCREENING TOOL     IDENTIFICATION  Patient Name: Kelly Rowland Birthdate: Jul 24, 1941 Sex: female Admission Date (Current Location): 10/17/2022  [facility] and [place] Number:  Chiropodist and Address:         Provider Number: [id number]  Attending Physician Name and Address:  Enedina Finner, MD  Relative Name and Phone Number:       Current Level of Care: Hospital Recommended Level of Care: Skilled Nursing Facility Prior Approval Number:    Date Approved/Denied:   PASRR Number: [id number]  Discharge Plan: SNF    Current Diagnoses: Patient Active Problem List   Diagnosis Date Noted   UGIB (upper gastrointestinal bleed) 10/18/2022   Acute upper GI bleeding 10/18/2022   GI bleed 10/17/2022   Hypokalemia 10/17/2022   Normocytic anemia 10/17/2022   Lumbosacral spondylosis without myelopathy 09/16/2022   Spinal stenosis of lumbar region 09/16/2022   Synovitis of ankle 07/02/2022   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   Intermittent claudication (HCC) 02/13/2020   Essential hypertension 12/05/2019   Bilateral carotid artery stenosis 12/26/2018   Diabetes mellitus (HCC) 11/22/2018   Moderate chronic obstructive pulmonary disease (HCC) 07/26/2018   Peripheral arterial disease (HCC) 11/09/2017   Restless legs 11/09/2017   Coronary artery disease 10/06/2017   Mixed hyperlipidemia 08/23/2017   Heart failure, unspecified (HCC) 08/23/2017   Peripheral edema 08/19/2017   Osteopenia 06/26/2015    Orientation RESPIRATION BLADDER Height & Weight     Self, Time, Situation, Place  O2 (3L [place]) Continent Weight: 65.3 kg Height:  5\' 2"  (157.5 cm)  BEHAVIORAL SYMPTOMS/MOOD NEUROLOGICAL BOWEL NUTRITION STATUS      Continent Diet (Clears.  Will advance prior to discharge)  AMBULATORY STATUS COMMUNICATION OF NEEDS Skin   Extensive Assist Verbally Normal                        Personal Care Assistance Level of Assistance              Functional Limitations Info             SPECIAL CARE FACTORS FREQUENCY  PT (By licensed PT), OT (By licensed OT)                    Contractures Contractures Info: Not present    Additional Factors Info  Code Status, Allergies Code Status Info: full Allergies Info: Amoxicillin, Lisinopril, Tramadol, Gabapentin, Colchicine           Current Medications (10/20/2022):  This is the current hospital active medication list Current Facility-Administered Medications  Medication Dose Route Frequency Provider Last Rate Last Admin   acetaminophen (TYLENOL) tablet 650 mg  650 mg Oral Q6H PRN 13/06/2022, MD   650 mg at 10/18/22 1629   Or   acetaminophen (TYLENOL) suppository 650 mg  650 mg Rectal Q6H PRN 13/05/23, MD       albuterol (PROVENTIL) (2.5 MG/3ML) 0.083% nebulizer solution 3 mL  3 mL Inhalation Q4H PRN Enedina Finner, MD       amLODipine (NORVASC) tablet 5 mg  5 mg Oral Daily Enedina Finner, MD   5 mg at 10/20/22 1025   bisacodyl (DULCOLAX) suppository 10 mg  10 mg Rectal Daily PRN 13/07/23, MD       carvedilol (COREG) tablet 25 mg  25 mg Oral BID WC Enedina Finner, MD   25 mg at 10/20/22  6712   cholecalciferol (VITAMIN D3) 25 MCG (1000 UNIT) tablet 2,000 Units  2,000 Units Oral Daily Fritzi Mandes, MD   2,000 Units at 10/20/22 1025   cyanocobalamin (VITAMIN B12) tablet 500 mcg  500 mcg Oral Daily Fritzi Mandes, MD   500 mcg at 45/80/99 8338   folic acid (FOLVITE) tablet 1 mg  1,000 mcg Oral Daily Fritzi Mandes, MD   1 mg at 10/20/22 1026   gabapentin (NEURONTIN) capsule 100 mg  100 mg Oral Q2000 Fritzi Mandes, MD   100 mg at 10/19/22 2024   glimepiride (AMARYL) tablet 4 mg  4 mg Oral Daily Fritzi Mandes, MD   4 mg at 10/20/22 1025   hydrALAZINE (APRESOLINE) injection 10 mg  10 mg Intravenous Q6H PRN Fritzi Mandes, MD       influenza vaccine adjuvanted (FLUAD) injection 0.5 mL  0.5 mL Intramuscular Tomorrow-1000 Fritzi Mandes, MD       insulin aspart (novoLOG) injection 0-15 Units  0-15 Units Subcutaneous TID WC Fritzi Mandes, MD   3 Units at 10/20/22 1208   insulin glargine-yfgn (SEMGLEE) injection 10 Units  10 Units Subcutaneous Daily Fritzi Mandes, MD   10 Units at 10/20/22 1026   mometasone-formoterol (DULERA) 200-5 MCG/ACT inhaler 2 puff  2 puff Inhalation BID Fritzi Mandes, MD   2 puff at 10/20/22 0753   ondansetron (ZOFRAN) tablet 4 mg  4 mg Oral Q6H PRN Fritzi Mandes, MD       Or   ondansetron [facility]) injection 4 mg  4 mg Intravenous Q6H PRN Fritzi Mandes, MD   4 mg at 10/20/22 0751   Oral care mouth rinse  15 mL Mouth Rinse PRN Fritzi Mandes, MD       oxyCODONE-acetaminophen (PERCOCET/ROXICET) 5-325 MG per tablet 1-2 tablet  1-2 tablet Oral Q6H PRN Mansy, Jan A, MD   2 tablet at 10/20/22 1025   polyethylene glycol (MIRALAX / GLYCOLAX) packet 17 g  17 g Oral Daily PRN Fritzi Mandes, MD       senna (SENOKOT) tablet 8.6 mg  1 tablet Oral BID Fritzi Mandes, MD   8.6 mg at 10/20/22 1026   simvastatin (ZOCOR) tablet 20 mg  20 mg Oral QHS Fritzi Mandes, MD   20 mg at 10/19/22 2024   traZODone (DESYREL) tablet 25 mg  25 mg Oral QHS PRN Fritzi Mandes, MD   25 mg at 10/18/22 0001     Discharge Medications: Please see discharge summary for a list of discharge medications.  Relevant Imaging Results:  Relevant Lab Results:   Additional Information ss [id number]  Beverly Sessions, RN

## 2022-10-20 NOTE — Inpatient Diabetes Management (Signed)
Inpatient Diabetes Program Recommendations  AACE/ADA: New Consensus Statement on Inpatient Glycemic Control (2015)  Target Ranges:  Prepandial:   less than 140 mg/dL      Peak postprandial:   less than 180 mg/dL (1-2 hours)      Critically ill patients:  140 - 180 mg/dL   Lab Results  Component Value Date   GLUCAP 206 (H) 10/20/2022   HGBA1C 7.2 (H) 10/17/2022    Latest Reference Range & Units 10/19/22 07:38 10/19/22 11:28 10/19/22 16:55 10/19/22 21:04 10/19/22 23:51 10/20/22 07:40  Glucose-Capillary 70 - 99 mg/dL 192 (H) 189 (H) 166 (H) Novolog 3 units 74 128 (H) 206 (H) Novolog 5 units  (H): Data is abnormally high  Diabetes history: DM  Outpatient Diabetes medications:  Amaryl 4 mg daily, NPH 24 units daily Current orders for Inpatient glycemic control:  Semglee 10 units qd Novolog 0-15 units tid with meals  Inpatient Diabetes Program Recommendations:   Please consider: -Decrease Novolog correction to 0-9 units tid  Thank you, Kelly Rowland. Markelle Najarian, RN, MSN, CDE  Diabetes Coordinator Inpatient Glycemic Control Team Team Pager [phone] (8am-5pm) 10/20/2022 10:15 AM

## 2022-10-20 NOTE — Progress Notes (Signed)
Physical Therapy Treatment Patient Details Name: Kelly Rowland MRN: [id number] DOB: 1941-06-01 Today's Date: 10/20/2022   History of Present Illness Pt is a 81 y/o F admitted on 10/17/22 after presenting to the ED with c/o generalized weakness, dizziness, & black tarry stools x 2 days. Pt is being treated for GI bleed. PMH: DJD, CAD, CKD, DM2, peptic ulcer disease, HTN, COPD    PT Comments    Pt received in bed lethargic but alert and agreeable to get OOB to chair. Pt c/o nausea at rest, and sever back pain with movement. Pt's nausea subsided when OOB in chair. Pt's HgB remains in low 7s despite blood transfusion. Pt unsafe to attempt ambulation 2/2 nausea, sever pain and low Blood Hgb. Pt SPO2 remained 91% at 2L [place]. Pt will benefit from continued PT interventions and from SNF after acute base don today's functional mobility.   Recommendations for follow up therapy are one component of a multi-disciplinary discharge planning process, led by the attending physician.  Recommendations may be updated based on patient status, additional functional criteria and insurance authorization.  Follow Up Recommendations  Skilled nursing-short term rehab (<3 hours/day)     Assistance Recommended at Discharge Intermittent Supervision/Assistance  Patient can return home with the following A lot of help with walking and/or transfers;A lot of help with bathing/dressing/bathroom;Assistance with cooking/housework;Direct supervision/assist for medications management;Direct supervision/assist for financial management;Assist for transportation;Help with stairs or ramp for entrance   Equipment Recommendations  None recommended by PT    Recommendations for Other Services       Precautions / Restrictions Precautions Precautions: Fall Restrictions Weight Bearing Restrictions: No     Mobility  Bed Mobility Overal bed mobility: Needs Assistance Bed Mobility: Supine to Sit     Supine to sit: Min assist,  HOB elevated     General bed mobility comments: uses bed rails    Transfers Overall transfer level: Needs assistance Equipment used: Rolling walker (2 wheels) Transfers: Sit to/from Stand, Bed to chair/wheelchair/BSC Sit to Stand: Min assist Stand pivot transfers: Min assist         General transfer comment: STS from EOB with RW with difficulty achieving upright posutre 2/2 to severe pain and nausea.    Ambulation/Gait: Unsafe               General Gait Details: Unsafe 2/2 ot pain, nausea, lethargy, and HgB low.   Stairs: N/A             Wheelchair Mobility: N/A    Modified Rankin (Stroke Patients Only)       Balance Overall balance assessment: Needs assistance Sitting-balance support: Feet supported Sitting balance-Leahy Scale: Good Sitting balance - Comments: supervision static sitting EOB   Standing balance support: During functional activity, Bilateral upper extremity supported Standing balance-Leahy Scale: Fair Standing balance comment: min assist to maintain an dsafe t./f due to pain and nausea.                            Cognition Arousal/Alertness: Awake/alert, Lethargic Behavior During Therapy: WFL for tasks assessed/performed Overall Cognitive Status: Within Functional Limits for tasks assessed                                 General Comments: slow and weak 2/2 to nausea and pain        Exercises      General  Comments        Pertinent Vitals/Pain Pain Assessment Pain Assessment: 0-10 Pain Score: 10-Worst pain ever Pain Location: Low back Pain Descriptors / Indicators: Constant, Sharp, Stabbing Pain Intervention(s): Limited activity within patient's tolerance, Patient requesting pain meds-RN notified    Home Living                          Prior Function            PT Goals (current goals can now be found in the care plan section) Acute Rehab PT Goals Patient Stated Goal: feel  better. PT Goal Formulation: With patient Time For Goal Achievement: 11/01/22 Progress towards PT goals: Progressing toward goals    Frequency    Min 2X/week      PT Plan Current plan remains appropriate    Co-evaluation              AM-PAC PT "6 Clicks" Mobility   Outcome Measure  Help needed turning from your back to your side while in a flat bed without using bedrails?: A Little Help needed moving from lying on your back to sitting on the side of a flat bed without using bedrails?: A Little Help needed moving to and from a bed to a chair (including a wheelchair)?: A Little Help needed standing up from a chair using your arms (e.g., wheelchair or bedside chair)?: A Little Help needed to walk in hospital room?: A Lot Help needed climbing 3-5 steps with a railing? : Total 6 Click Score: 15    End of Session Equipment Utilized During Treatment: Gait belt;Oxygen Activity Tolerance: Patient limited by lethargy;Treatment limited secondary to medical complications (Comment) Patient left: in chair;with call bell/phone within reach;with chair alarm set Nurse Communication: Mobility status PT Visit Diagnosis: Muscle weakness (generalized) (M62.81);Unsteadiness on feet (R26.81);Difficulty in walking, not elsewhere classified (R26.2)     Time: 9381-0175 PT Time Calculation (min) (ACUTE ONLY): 23 min  Charges:  $Therapeutic Activity: 23-37 mins                    Ulla Mckiernan PT DPT 1:29 PM,10/20/22

## 2022-10-20 NOTE — Plan of Care (Signed)
Pt alert and oriented x 3. Periods of confusion during hs at times. Pt weak with mobility needs 1 assist but unable to tolerate sitting for long. Purewick in place. Pt received 2 percocets and she stated it was more effective than 1. Hgb improved with 11/6 hs labs.  Problem: Education: Goal: Knowledge of General Education information will improve Description: Including pain rating scale, medication(s)/side effects and non-pharmacologic comfort measures Outcome: Progressing   Problem: Health Behavior/Discharge Planning: Goal: Ability to manage health-related needs will improve Outcome: Progressing   Problem: Clinical Measurements: Goal: Ability to maintain clinical measurements within normal limits will improve Outcome: Progressing Goal: Will remain free from infection Outcome: Progressing Goal: Diagnostic test results will improve Outcome: Progressing Goal: Respiratory complications will improve Outcome: Progressing Goal: Cardiovascular complication will be avoided Outcome: Progressing   Problem: Activity: Goal: Risk for activity intolerance will decrease Outcome: Progressing   Problem: Nutrition: Goal: Adequate nutrition will be maintained Outcome: Progressing   Problem: Coping: Goal: Level of anxiety will decrease Outcome: Progressing   Problem: Elimination: Goal: Will not experience complications related to bowel motility Outcome: Progressing Goal: Will not experience complications related to urinary retention Outcome: Progressing   Problem: Pain Managment: Goal: General experience of comfort will improve Outcome: Progressing   Problem: Safety: Goal: Ability to remain free from injury will improve Outcome: Progressing   Problem: Skin Integrity: Goal: Risk for impaired skin integrity will decrease Outcome: Progressing   Problem: Education: Goal: Ability to describe self-care measures that may prevent or decrease complications (Diabetes Survival Skills Education)  will improve Outcome: Progressing Goal: Individualized Educational Video(s) Outcome: Progressing   Problem: Coping: Goal: Ability to adjust to condition or change in health will improve Outcome: Progressing   Problem: Fluid Volume: Goal: Ability to maintain a balanced intake and output will improve Outcome: Progressing   Problem: Health Behavior/Discharge Planning: Goal: Ability to identify and utilize available resources and services will improve Outcome: Progressing Goal: Ability to manage health-related needs will improve Outcome: Progressing   Problem: Metabolic: Goal: Ability to maintain appropriate glucose levels will improve Outcome: Progressing   Problem: Nutritional: Goal: Maintenance of adequate nutrition will improve Outcome: Progressing Goal: Progress toward achieving an optimal weight will improve Outcome: Progressing   Problem: Skin Integrity: Goal: Risk for impaired skin integrity will decrease Outcome: Progressing   Problem: Tissue Perfusion: Goal: Adequacy of tissue perfusion will improve Outcome: Progressing

## 2022-10-20 NOTE — TOC Progression Note (Signed)
Transition of Care [facility]) - Progression Note    Patient Details  Name: Vola Beneke MRN: [id number] Date of Birth: 12-22-1940  Transition of Care [facility]) CM/SW Contact  Beverly Sessions, RN Phone Number: 10/20/2022, 3:18 PM  Clinical Narrative:     Therapy recommending SNF.  Patient agreeable to bed search Passr obtained Fl2 sent for signature Bed search initiated   Expected Discharge Plan: [place] Barriers to Discharge: Continued Medical Work up  Expected Discharge Plan and Services Expected Discharge Plan: [place] arrangements for the past 2 months: [place]: PT [place]: [place] ([place]) Date [place]: 10/18/22   Representative spoke with at [place]: [place] ([place]) Interventions    Readmission Risk Interventions     No data to display

## 2022-10-21 DIAGNOSIS — K922 Gastrointestinal hemorrhage, unspecified: Secondary | ICD-10-CM | POA: Diagnosis not present

## 2022-10-21 LAB — URINALYSIS, COMPLETE (UACMP) WITH MICROSCOPIC
Bilirubin Urine: NEGATIVE
Glucose, UA: NEGATIVE mg/dL
Hgb urine dipstick: NEGATIVE
Ketones, ur: NEGATIVE mg/dL
Nitrite: NEGATIVE
Protein, ur: NEGATIVE mg/dL
Specific Gravity, Urine: 1.017 (ref 1.005–1.030)
WBC, UA: >50 WBC/hpf — ABNORMAL HIGH (ref 0–5)
pH: 5 (ref 5.0–8.0)

## 2022-10-21 LAB — BASIC METABOLIC PANEL
Anion gap: 6 (ref 5–15)
Anion gap: 7 (ref 5–15)
BUN: 50 mg/dL — ABNORMAL HIGH (ref 8–23)
BUN: 53 mg/dL — ABNORMAL HIGH (ref 8–23)
CO2: 28 mmol/L (ref 22–32)
CO2: 28 mmol/L (ref 22–32)
Calcium: 7.8 mg/dL — ABNORMAL LOW (ref 8.9–10.3)
Calcium: 8 mg/dL — ABNORMAL LOW (ref 8.9–10.3)
Chloride: 103 mmol/L (ref 98–111)
Chloride: 103 mmol/L (ref 98–111)
Creatinine, Ser: 1.35 mg/dL — ABNORMAL HIGH (ref 0.44–1.00)
Creatinine, Ser: 1.44 mg/dL — ABNORMAL HIGH (ref 0.44–1.00)
GFR, Estimated: 40 mL/min — ABNORMAL LOW (ref >60)
GFR, Estimated: 37 mL/min — ABNORMAL LOW (ref >60)
Glucose, Bld: 195 mg/dL — ABNORMAL HIGH (ref 70–99)
Glucose, Bld: 199 mg/dL — ABNORMAL HIGH (ref 70–99)
Potassium: 3.4 mmol/L — ABNORMAL LOW (ref 3.5–5.1)
Potassium: 3.4 mmol/L — ABNORMAL LOW (ref 3.5–5.1)
Sodium: 137 mmol/L (ref 135–145)
Sodium: 138 mmol/L (ref 135–145)

## 2022-10-21 LAB — CBC
HCT: 22.4 % — ABNORMAL LOW (ref 36.0–46.0)
Hemoglobin: 7.3 g/dL — ABNORMAL LOW (ref 12.0–15.0)
MCH: 28.7 pg (ref 26.0–34.0)
MCHC: 32.6 g/dL (ref 30.0–36.0)
MCV: 88.2 fL (ref 80.0–100.0)
Platelets: 158 10*3/uL (ref 150–400)
RBC: 2.54 MIL/uL — ABNORMAL LOW (ref 3.87–5.11)
RDW: 17.1 % — ABNORMAL HIGH (ref 11.5–15.5)
WBC: 13 10*3/uL — ABNORMAL HIGH (ref 4.0–10.5)
nRBC: 0 % (ref 0.0–0.2)

## 2022-10-21 LAB — GLUCOSE, CAPILLARY
Glucose-Capillary: 201 mg/dL — ABNORMAL HIGH (ref 70–99)
Glucose-Capillary: 123 mg/dL — ABNORMAL HIGH (ref 70–99)
Glucose-Capillary: 172 mg/dL — ABNORMAL HIGH (ref 70–99)
Glucose-Capillary: 105 mg/dL — ABNORMAL HIGH (ref 70–99)

## 2022-10-21 LAB — HEMOGLOBIN AND HEMATOCRIT, BLOOD
HCT: 22.5 % — ABNORMAL LOW (ref 36.0–46.0)
Hemoglobin: 7.5 g/dL — ABNORMAL LOW (ref 12.0–15.0)

## 2022-10-21 LAB — PHOSPHORUS: Phosphorus: 2.9 mg/dL (ref 2.5–4.6)

## 2022-10-21 MED ORDER — POTASSIUM CHLORIDE 10 MEQ/100ML IV SOLN
10.0000 meq | INTRAVENOUS | Status: AC
  Administered 2022-10-21 (×4): 10 meq via INTRAVENOUS
  Filled 2022-10-21 (×4): qty 100

## 2022-10-21 MED ORDER — PEG 3350-KCL-NA BICARB-NACL 420 G PO SOLR
4000.0000 mL | Freq: Once | ORAL | Status: AC
  Administered 2022-10-21: 4000 mL via ORAL
  Filled 2022-10-21: qty 4000

## 2022-10-21 MED ORDER — OXYCODONE HCL 5 MG PO TABS
5.0000 mg | ORAL_TABLET | Freq: Four times a day (QID) | ORAL | Status: DC | PRN
  Administered 2022-10-21: 5 mg via ORAL
  Administered 2022-10-22: 10 mg via ORAL
  Administered 2022-10-22: 5 mg via ORAL
  Administered 2022-10-22: 10 mg via ORAL
  Administered 2022-10-22: 5 mg via ORAL
  Administered 2022-10-23 – 2022-10-29 (×17): 10 mg via ORAL
  Administered 2022-10-29: 5 mg via ORAL
  Administered 2022-10-29 – 2022-10-30 (×3): 10 mg via ORAL
  Filled 2022-10-21 (×4): qty 2
  Filled 2022-10-21: qty 1
  Filled 2022-10-21 (×3): qty 2
  Filled 2022-10-21: qty 1
  Filled 2022-10-21 (×4): qty 2
  Filled 2022-10-21: qty 1
  Filled 2022-10-21 (×12): qty 2

## 2022-10-21 NOTE — Progress Notes (Signed)
PROGRESS NOTE    Kelly Rowland  PQZ:[id number] DOB: 11-05-1941 DOA: 10/17/2022 PCP: Lake Bells, MD  [id number]/[id number]-AA  LOS: 4 days   Brief hospital course:   Assessment & Plan: Kelly Rowland is a 81 y.o. 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   Patient states she took [facility] powder last week for her back pain. She denies any vomiting or any bright red blood per rectum.    [name] -- baseline hemoglobin 15.0 in June 2022 --11/5--EGD - Bleeding friable duodenal mucosa. Injected. Treated with bipolar cautery. --continues to drop Hgb, s/p 3u pRBC total so far Plan: --repeat bowel prep today for colonoscopy tomorrow  Acute blood loss anemia --presumed due to GI bleed.  Anemia workup showed no def in iron, vit B12 or folate --transfuse to keep Hgb >7  acute hypoxic respiratory failure suspect pulmonary edema with volume overload CHF ruled out -- developed hypoxia on 11/5.  chest x-ray showed bilateral pulmonary edema.  Likely from fluid overload from IVF and blood transfusion -- IV Lasix 20 mg x2 --Echo showed normal systolic and diastolic function --weaned down to room air   Chest pain, ACS ruled out history of CAD status post stent --trop neg.  Cardiology consulted, ruled out ACS.  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  Baseline creatinine 1.4-- 1.7  PAD Carotid artery stenosis --hold home ASA --cont statin   History of hypertension --hold home coreg and amlodipine  DM2 --cont gargine 10u daily --ACHS and SSI  Chronic back pain on chronic opioids --switch home percocet to oxycodone to avoid tylenol  Dysuria --UA and urine cx today   DVT prophylaxis:  SCD/Compression stockings Code Status: Full code  Family Communication: sister updated at bedside today Level of care: Med-Surg Dispo:   The patient is from: home Anticipated d/c is to: home Anticipated d/c date is: 1-2 days   Subjective and Interval History:  Pt didn't finish the bowel prep due to somnolence yesterday.  Colonoscopy postponed.  Had a mild fever last night.  This morning, pt complained of dysuria.  Also chronic back pain.   Objective: Vitals:   10/21/22 0001 10/21/22 0617 10/21/22 0803 10/21/22 1621  BP: (!) 129/52 (!) 125/45 (!) 103/41 (!) 157/68  Pulse: 71 75 70 83  Resp: 18 18 19 20   Temp: 98.1 F (36.7 C) 99.4 F (37.4 C) 98.9 F (37.2 C) 99.9 F (37.7 C)  TempSrc: Oral Oral Oral Oral  SpO2: 95% 97% 93% 94%  Weight:      Height:        Intake/Output Summary (Last 24 hours) at 10/21/2022 1723 Last data filed at 10/21/2022 1538 Gross per 24 hour  Intake 997.44 ml  Output 900 ml  Net 97.44 ml   Filed Weights   10/17/22 1333  Weight: 65.3 kg    Examination:   Constitutional: NAD, AAOx3 HEENT: conjunctivae and lids normal, EOMI CV: No cyanosis.   RESP: normal respiratory effort, on RA Extremities: No effusions, edema in BLE SKIN: warm, dry Neuro: II - XII grossly intact.   Psych: depressed mood and affect.  Appropriate judgement and reason   Data Reviewed: I have personally reviewed labs and imaging studies  Time spent: 50 minutes  13/04/23, MD [facility]  If 7PM-7AM, please contact night-coverage 10/21/2022, 5:23 PM

## 2022-10-21 NOTE — Progress Notes (Signed)
Transfused PRBC 1 unit, no adverse effects noted. Administered prn pain meds x2 per request for back pain-partial effects noted. Pt unable to complete bowel prep due to increased drowsiness in afternoon into later evening hours. Pt more alert throughout the night. Vital signs remain stable. NPO since after midnight with sips of water with meds. Incontinent of 2 tarry stools this am.

## 2022-10-21 NOTE — Progress Notes (Signed)
Physical Therapy Treatment Patient Details Name: Kelly Rowland MRN: [id number] DOB: December 01, 1941 Today's Date: 10/21/2022   History of Present Illness Pt is a 81 y/o F admitted on 10/17/22 after presenting to the ED with c/o generalized weakness, dizziness, & black tarry stools x 2 days. Pt is being treated for GI bleed. PMH: DJD, CAD, CKD, DM2, peptic ulcer disease, HTN, COPD    PT Comments    Pt is making gradual progress towards goals with motivation to perform transfer bed->chair, however limited by urgent need for BM. Once finished with BSC, pt with severe back pain and requested to transfer back to bed. Secure chat sent to RN. Reports pain at 10/10. Will continue to progress.   Recommendations for follow up therapy are one component of a multi-disciplinary discharge planning process, led by the attending physician.  Recommendations may be updated based on patient status, additional functional criteria and insurance authorization.  Follow Up Recommendations  Skilled nursing-short term rehab (<3 hours/day) Can patient physically be transported by private vehicle: No   Assistance Recommended at Discharge Intermittent Supervision/Assistance  Patient can return home with the following A lot of help with walking and/or transfers;A lot of help with bathing/dressing/bathroom;Assistance with cooking/housework;Direct supervision/assist for medications management;Direct supervision/assist for financial management;Assist for transportation;Help with stairs or ramp for entrance   Equipment Recommendations  None recommended by PT    Recommendations for Other Services       Precautions / Restrictions Precautions Precautions: Fall Restrictions Weight Bearing Restrictions: No     Mobility  Bed Mobility Overal bed mobility: Needs Assistance Bed Mobility: Supine to Sit     Supine to sit: Mod assist     General bed mobility comments: fatigues quickly and complains of increased back pain.  Heavy assist for trunkal elevation    Transfers Overall transfer level: Needs assistance Equipment used: Rolling walker (2 wheels) Transfers: Bed to chair/wheelchair/BSC   Stand pivot transfers: Mod assist         General transfer comment: needs assist and sequencing for hand placement. Once standing, low tolerance noted    Ambulation/Gait               General Gait Details: uanble secondary to pain   Stairs             Wheelchair Mobility    Modified Rankin (Stroke Patients Only)       Balance Overall balance assessment: Needs assistance Sitting-balance support: Feet supported Sitting balance-Leahy Scale: Good     Standing balance support: Bilateral upper extremity supported Standing balance-Leahy Scale: Poor                              Cognition Arousal/Alertness: Awake/alert Behavior During Therapy: WFL for tasks assessed/performed Overall Cognitive Status: Within Functional Limits for tasks assessed                                          Exercises Other Exercises Other Exercises: SPT to [facility] for liquid BM. Pt attempted to perform self hygiene, however needed max assist for completion.    General Comments        Pertinent Vitals/Pain Pain Assessment Pain Assessment: 0-10 Pain Score: 10-Worst pain ever Pain Location: Low back Pain Descriptors / Indicators: Constant, Sharp, Stabbing Pain Intervention(s): Limited activity within patient's tolerance    Home Living  Prior Function            PT Goals (current goals can now be found in the care plan section) Acute Rehab PT Goals Patient Stated Goal: feel better. PT Goal Formulation: With patient Time For Goal Achievement: 11/01/22 Potential to Achieve Goals: Good Progress towards PT goals: Progressing toward goals    Frequency    Min 2X/week      PT Plan Current plan remains appropriate    Co-evaluation               AM-PAC PT "6 Clicks" Mobility   Outcome Measure  Help needed turning from your back to your side while in a flat bed without using bedrails?: A Little Help needed moving from lying on your back to sitting on the side of a flat bed without using bedrails?: A Lot Help needed moving to and from a bed to a chair (including a wheelchair)?: A Lot Help needed standing up from a chair using your arms (e.g., wheelchair or bedside chair)?: A Lot Help needed to walk in hospital room?: Total Help needed climbing 3-5 steps with a railing? : Total 6 Click Score: 11    End of Session Equipment Utilized During Treatment: Gait belt;Oxygen Activity Tolerance: Patient limited by pain Patient left: in bed;with bed alarm set (reposition with pillow between legs) Nurse Communication: Mobility status PT Visit Diagnosis: Muscle weakness (generalized) (M62.81);Unsteadiness on feet (R26.81);Difficulty in walking, not elsewhere classified (R26.2)     Time: 3244-0102 PT Time Calculation (min) (ACUTE ONLY): 24 min  Charges:  $Therapeutic Activity: 23-37 mins                     Kelly Rowland, PT, DPT, GCS [phone]    Kelly Rowland 10/21/2022, 3:59 PM

## 2022-10-21 NOTE — Progress Notes (Signed)
Kelly Minium, MD [facility]   [address] [place], [place] [address] Phone: [phone] Fax : [phone]   Subjective: Called back to see this patient for continued hemoglobin drop.  The patient had thermal treatment to oozing in the duodenum back on 11 5.  Despite this the patient's hemoglobin continues to decrease and I was contacted by Dr. Allena Katz to reevaluate the patient for possible further intervention.  The patient denies any abdominal pain nausea vomiting fevers or chills.   Objective: Vital signs in last 24 hours: Vitals:   10/20/22 2036 10/20/22 2332 10/21/22 0001 10/21/22 0617  BP: (!) 119/43 (!) 135/48 (!) 129/52 (!) 125/45  Pulse: 69 69 71 75  Resp: 16 17 18 18   Temp: 99.1 F (37.3 C) 98.6 F (37 C) 98.1 F (36.7 C) 99.4 F (37.4 C)  TempSrc:  Oral Oral Oral  SpO2: 97% 95% 95% 97%  Weight:      Height:       Weight change:   Intake/Output Summary (Last 24 hours) at 10/21/2022 0745 Last data filed at 10/21/2022 0500 Gross per 24 hour  Intake 378 ml  Output 750 ml  Net -372 ml     Exam: Heart:: Regular rate and rhythm, S1S2 present, or without murmur or extra heart sounds Lungs: normal and clear to auscultation and percussion Abdomen: soft, nontender, normal bowel sounds   Lab Results: @LABTEST2 @ Micro Results: No results found for this or any previous visit (from the past 240 hour(s)). Studies/Results: ECHOCARDIOGRAM COMPLETE  Result Date: 10/19/2022    ECHOCARDIOGRAM REPORT   Patient Name:   YER CASTELLO Date of Exam: 10/19/2022 Medical Rec #:  Katherine Mantle     Height:       62.0 in Accession #:    13/05/2022    Weight:       144.0 lb Date of Birth:  07/07/1941     BSA:          1.663 m Patient Age:    81 years      BP:           138/55 mmHg Patient Gender: F             HR:           81 bpm. Exam Location:  [facility] Procedure: 2D Echo, Color Doppler, Cardiac Doppler and 3D Echo Indications:     Acute Respiratory Distress R06.03  History:          Patient has no prior history of Echocardiogram examinations.                  CAD, COPD; Risk Factors:Hypertension, Dyslipidemia and                  Diabetes.  Sonographer:     [id number] RDCS Referring Phys:  [id number] SONA PATEL Diagnosing Phys: Thurman Coyer MD IMPRESSIONS  1. Left ventricular ejection fraction, by estimation, is 55 to 60%. The left ventricle has normal function. The left ventricle has no regional wall motion abnormalities. There is mild left ventricular hypertrophy. Left ventricular diastolic parameters were normal.  2. Right ventricular systolic function is normal. The right ventricular size is normal. Tricuspid regurgitation signal is inadequate for assessing PA pressure.  3. Left atrial size was mildly dilated.  4. The mitral valve is normal in structure. Mild mitral valve regurgitation. No evidence of mitral stenosis.  5. The aortic valve is normal in structure. Aortic valve regurgitation is not visualized.  Aortic valve sclerosis is present, with no evidence of aortic valve stenosis.  6. The inferior vena cava is dilated in size with >50% respiratory variability, suggesting right atrial pressure of 8 mmHg. FINDINGS  Left Ventricle: Left ventricular ejection fraction, by estimation, is 55 to 60%. The left ventricle has normal function. The left ventricle has no regional wall motion abnormalities. The left ventricular internal cavity size was normal in size. There is  mild left ventricular hypertrophy. Left ventricular diastolic parameters were normal. Right Ventricle: The right ventricular size is normal. No increase in right ventricular wall thickness. Right ventricular systolic function is normal. Tricuspid regurgitation signal is inadequate for assessing PA pressure. Left Atrium: Left atrial size was mildly dilated. Right Atrium: Right atrial size was normal in size. Pericardium: There is no evidence of pericardial effusion. Mitral Valve: The mitral valve is normal in structure. Mild  mitral valve regurgitation. No evidence of mitral valve stenosis. Tricuspid Valve: The tricuspid valve is normal in structure. Tricuspid valve regurgitation is trivial. No evidence of tricuspid stenosis. Aortic Valve: The aortic valve is normal in structure. Aortic valve regurgitation is not visualized. Aortic valve sclerosis is present, with no evidence of aortic valve stenosis. Pulmonic Valve: The pulmonic valve was normal in structure. Pulmonic valve regurgitation is not visualized. No evidence of pulmonic stenosis. Aorta: The aortic root is normal in size and structure. Venous: The inferior vena cava is dilated in size with greater than 50% respiratory variability, suggesting right atrial pressure of 8 mmHg. IAS/Shunts: No atrial level shunt detected by color flow Doppler.  LEFT VENTRICLE PLAX 2D LVIDd:         4.10 cm   Diastology LVIDs:         2.80 cm   LV e' medial:    8.81 cm/s LV PW:         1.20 cm   LV E/e' medial:  14.3 LV IVS:        1.20 cm   LV e' lateral:   8.49 cm/s LVOT diam:     2.10 cm   LV E/e' lateral: 14.8 LV SV:         84 LV SV Index:   51 LVOT Area:     3.46 cm                           3D Volume EF:                          3D EF:        65 %                          LV EDV:       137 ml                          LV ESV:       47 ml                          LV SV:        90 ml RIGHT VENTRICLE RV Basal diam:  3.60 cm RV Mid diam:    3.40 cm RV S prime:     14.30 cm/s TAPSE (M-mode): 2.5 cm LEFT ATRIUM  Index        RIGHT ATRIUM           Index LA diam:        3.40 cm 2.05 cm/m   RA Area:     13.20 cm LA Vol (A2C):   50.8 ml 30.55 ml/m  RA Volume:   29.20 ml  17.56 ml/m LA Vol (A4C):   54.4 ml 32.72 ml/m LA Biplane Vol: 55.7 ml 33.50 ml/m  AORTIC VALVE LVOT Vmax:   114.00 cm/s LVOT Vmean:  76.800 cm/s LVOT VTI:    0.243 m  AORTA Ao Root diam: 2.50 cm MITRAL VALVE MV Area (PHT): 3.89 cm     SHUNTS MV Decel Time: 195 msec     Systemic VTI:  0.24 m MV E velocity: 126.00 cm/s   Systemic Diam: 2.10 cm MV A velocity: 118.00 cm/s MV E/A ratio:  1.07 Lorine Bears MD Electronically signed by Lorine Bears MD Signature Date/Time: 10/19/2022/2:56:53 PM    Final    CT PANCREAS ABD W/WO  Result Date: 10/19/2022 CLINICAL DATA:  Abdominal pain. Concern for pancreatic mass on endoscopic exam. EXAM: CT ABDOMEN WITHOUT AND WITH CONTRAST TECHNIQUE: Multidetector CT imaging of the abdomen was performed following the standard protocol before and following the bolus administration of intravenous contrast. RADIATION DOSE REDUCTION: This exam was performed according to the departmental dose-optimization program which includes automated exposure control, adjustment of the mA and/or kV according to patient size and/or use of iterative reconstruction technique. CONTRAST:  52mL OMNIPAQUE IOHEXOL 350 MG/ML SOLN COMPARISON:  None Available. FINDINGS: Lower chest:  Mild bibasilar atelectasis. Hepatobiliary: No focal hepatic lesion. No biliary duct dilatation. Gallbladder normal. Common bile duct normal. Pancreas: No pancreatic ductal dilatation. Pancreas is mildly atrophic. No mass lesion identified within the pancreas. No peripancreatic adenopathy. Spleen: Normal spleen. Adrenals/urinary tract: Bilobed simple fluid attenuation cystic lesion of the mid LEFT kidney measures 3.4 cm. There is a central thin calcified cyst wall between the bilobed cysts (image 20/2). Two nonobstructing calculi LEFT kidney. No ureterolithiasis proximally. Stomach/Bowel: Stomach and duodenum appear normal. Limited view of the small bowelcolon Unremarkable. Vascular/Lymphatic: Abdominal aorta is normal caliber with atherosclerotic calcification. There is no retroperitoneal or periportal lymphadenopathy. No pelvic lymphadenopathy. Musculoskeletal: No aggressive osseous lesion IMPRESSION: 1. No pancreatic mass identified. No pancreatic duct dilatation or common bile duct dilatation. 2. Mild pancreatic atrophy consistent with patient's  age. 3. Normal liver without duct dilatation. 4. LEFT Bosniak II benign renal cyst measuring 3.4 cm. No follow-up imaging is recommended. JACR 2018 Feb; 264-273, Management of the Incidental Renal Mass on CT, [employer] 2021; 814-848, Bosniak Classification of Cystic Renal Masses, Version 2019. Electronically Signed   By: Genevive Bi M.D.   On: 10/19/2022 10:49   DG Chest Port 1 View  Result Date: 10/19/2022 CLINICAL DATA:  Shortness of breath. EXAM: PORTABLE CHEST 1 VIEW COMPARISON:  Chest radiographs 06/04/2020 FINDINGS: The cardiac silhouette is upper limits of normal in size. Aortic atherosclerosis is noted. There are increased interstitial densities throughout both lungs. No sizable pleural effusion or pneumothorax is identified. No acute osseous abnormality is seen. IMPRESSION: Bilateral interstitial densities which may reflect edema or viral/atypical infection. Electronically Signed   By: Sebastian Ache M.D.   On: 10/19/2022 08:44   Medications: I have reviewed the patient's current medications. Scheduled Meds:  amLODipine  5 mg Oral Daily   carvedilol  25 mg Oral BID WC   cholecalciferol  2,000 Units Oral Daily   cyanocobalamin  500  mcg Oral Daily   folic acid  1,000 mcg Oral Daily   gabapentin  100 mg Oral Q2000   glimepiride  4 mg Oral Daily   influenza vaccine adjuvanted  0.5 mL Intramuscular Tomorrow-1000   insulin aspart  0-15 Units Subcutaneous TID WC   insulin glargine-yfgn  10 Units Subcutaneous Daily   mometasone-formoterol  2 puff Inhalation BID   senna  1 tablet Oral BID   simvastatin  20 mg Oral QHS   Continuous Infusions:  potassium chloride     PRN Meds:.acetaminophen **OR** acetaminophen, albuterol, bisacodyl, hydrALAZINE, ondansetron **OR** ondansetron (ZOFRAN) IV, mouth rinse, oxyCODONE-acetaminophen, polyethylene glycol, traZODone   Assessment: Principal Problem:   GI bleed Active Problems:   Hypokalemia   Normocytic anemia   UGIB (upper  gastrointestinal bleed)   Acute upper GI bleeding    Plan: This patient has continued anemia with minimal response with transfusion of blood.  The patient will be set up for an EGD and colonoscopy for 11/8 by Dr. Tobi Bastos to look for another source of the patient's anemia or to see if the previously treated area is still bleeding.  The patient has been explained the plan and agrees with it.   LOS: 4 days   Sherlyn Hay 10/21/2022, 7:45 AM Pager [phone] 7am-5pm  Check AMION for 5pm -7am coverage and on weekends

## 2022-10-21 NOTE — Progress Notes (Signed)
Patient scheduled for Upper Endoscopy & Colonoscopy with Dr Tobi Bastos today but only drank 1/4th of her colon prep and her stool is not clear per the nurse.  Per Dr Tobi Bastos, procedure to be rescheduled for Thursday, November 8 and he will place orders for a new colon prep to be started today at 1700 and the patient is to drink the entire contents.  The patient may have clear liquids today.  Dorathy Daft, RN is aware of the reschedule date and will inform the patient.

## 2022-10-21 NOTE — Plan of Care (Signed)
  Problem: Education: Goal: Knowledge of General Education information will improve Description Including pain rating scale, medication(s)/side effects and non-pharmacologic comfort measures Outcome: Progressing   

## 2022-10-21 NOTE — Consult Note (Signed)
PHARMACY CONSULT NOTE  Pharmacy Consult for Electrolyte Monitoring and Replacement   Recent Labs: Potassium (mmol/L)  Date Value  10/21/2022 3.4 (L)   Magnesium (mg/dL)  Date Value  23/95/3202 2.2   Calcium (mg/dL)  Date Value  33/43/5686 7.8 (L)   Albumin (g/dL)  Date Value  16/83/7290 3.2 (L)   Phosphorus (mg/dL)  Date Value  21/10/5519 2.9   Sodium (mmol/L)  Date Value  10/21/2022 137     Assessment: 81yo F w/ h/o CAD, CKD, COPD, DM, HTN, & HLD who presents with dark stool. At this time unable to use gut for repletion, will utilize IV instead. Pharmacy consulted for mgmt of electrolytes.  Diuretics: s/p  IV Lasix 20 mg x 1  Goal of Therapy:  Electrolytes WNL  Plan:  K 3.4  Will order KCL 10 meq IV x 4 CTM and replace PRN. F/u labs in  AM  Angelique Blonder, PharmD Clinical Pharmacist 10/21/2022 7:39 AM

## 2022-10-21 NOTE — TOC Progression Note (Signed)
Transition of Care [facility]) - Progression Note    Patient Details  Name: Kelly Rowland MRN: [id number] Date of Birth: 1941/11/29  [facility] CM/SW Contact  Chapman Fitch, RN Phone Number: 10/21/2022, 11:53 AM  Clinical Narrative:     Bed offers presented. Patient accepts bed at [facility] Accepted in HUB and [name] at [facility] notified Plan for Endoscopy & Colonoscopy  tomorrow   Expected Discharge Plan: Home w Home Health Services Barriers to Discharge: Continued Medical Work up  Expected Discharge Plan and Services Expected Discharge Plan: Home w Home Health Services       Living arrangements for the past 2 months: Single Family Home                           HH Arranged: PT [facility] Agency: [facility] Date HH Agency Contacted: 10/18/22   Representative spoke with at [facility] Agency: [name]   Social Determinants of Health (SDOH) Interventions    Readmission Risk Interventions     No data to display

## 2022-10-22 ENCOUNTER — Encounter: Payer: Self-pay | Admitting: Internal Medicine

## 2022-10-22 ENCOUNTER — Encounter
Admission: EM | Disposition: A | Discharge status: Skilled Nursing Facility | Payer: Self-pay | Source: Home / Self Care | Attending: Internal Medicine

## 2022-10-22 ENCOUNTER — Inpatient Hospital Stay: Payer: Medicare Other | Admitting: Anesthesiology

## 2022-10-22 DIAGNOSIS — K269 Duodenal ulcer, unspecified as acute or chronic, without hemorrhage or perforation: Secondary | ICD-10-CM | POA: Insufficient documentation

## 2022-10-22 DIAGNOSIS — K921 Melena: Secondary | ICD-10-CM | POA: Diagnosis not present

## 2022-10-22 DIAGNOSIS — K264 Chronic or unspecified duodenal ulcer with hemorrhage: Secondary | ICD-10-CM | POA: Diagnosis not present

## 2022-10-22 DIAGNOSIS — K922 Gastrointestinal hemorrhage, unspecified: Secondary | ICD-10-CM | POA: Diagnosis not present

## 2022-10-22 HISTORY — DX: Melena: K92.1

## 2022-10-22 HISTORY — PX: ESOPHAGOGASTRODUODENOSCOPY (EGD) WITH PROPOFOL: SHX5813

## 2022-10-22 HISTORY — PX: COLONOSCOPY: SHX5424

## 2022-10-22 LAB — BPAM RBC
Blood Product Expiration Date: 202311122359
Blood Product Expiration Date: 202311192359
Blood Product Expiration Date: 202311212359
Blood Product Expiration Date: 202312122359
ISSUE DATE / TIME: 202311051607
ISSUE DATE / TIME: 202311061135
ISSUE DATE / TIME: 202311071742
ISSUE DATE / TIME: 202311072017
Unit Type and Rh: 1700
Unit Type and Rh: 1700
Unit Type and Rh: 1700
Unit Type and Rh: 7300

## 2022-10-22 LAB — SARS CORONAVIRUS 2 BY RT PCR: SARS Coronavirus 2 by RT PCR: NEGATIVE

## 2022-10-22 LAB — BASIC METABOLIC PANEL
Anion gap: 9 (ref 5–15)
BUN: 37 mg/dL — ABNORMAL HIGH (ref 8–23)
CO2: 25 mmol/L (ref 22–32)
Calcium: 8.1 mg/dL — ABNORMAL LOW (ref 8.9–10.3)
Chloride: 107 mmol/L (ref 98–111)
Creatinine, Ser: 1.26 mg/dL — ABNORMAL HIGH (ref 0.44–1.00)
GFR, Estimated: 43 mL/min — ABNORMAL LOW (ref >60)
Glucose, Bld: 179 mg/dL — ABNORMAL HIGH (ref 70–99)
Potassium: 4 mmol/L (ref 3.5–5.1)
Sodium: 141 mmol/L (ref 135–145)

## 2022-10-22 LAB — CBC
HCT: 20.2 % — ABNORMAL LOW (ref 36.0–46.0)
Hemoglobin: 6.5 g/dL — ABNORMAL LOW (ref 12.0–15.0)
MCH: 28.9 pg (ref 26.0–34.0)
MCHC: 32.2 g/dL (ref 30.0–36.0)
MCV: 89.8 fL (ref 80.0–100.0)
Platelets: 165 10*3/uL (ref 150–400)
RBC: 2.25 MIL/uL — ABNORMAL LOW (ref 3.87–5.11)
RDW: 16.6 % — ABNORMAL HIGH (ref 11.5–15.5)
WBC: 13.2 10*3/uL — ABNORMAL HIGH (ref 4.0–10.5)
nRBC: 0 % (ref 0.0–0.2)

## 2022-10-22 LAB — TYPE AND SCREEN
ABO/RH(D): B NEG
Antibody Screen: NEGATIVE
Unit division: 0
Unit division: 0
Unit division: 0
Unit division: 0

## 2022-10-22 LAB — PREPARE RBC (CROSSMATCH)

## 2022-10-22 LAB — RESPIRATORY PANEL BY PCR

## 2022-10-22 LAB — GLUCOSE, CAPILLARY
Glucose-Capillary: 140 mg/dL — ABNORMAL HIGH (ref 70–99)
Glucose-Capillary: 142 mg/dL — ABNORMAL HIGH (ref 70–99)
Glucose-Capillary: 169 mg/dL — ABNORMAL HIGH (ref 70–99)
Glucose-Capillary: 190 mg/dL — ABNORMAL HIGH (ref 70–99)

## 2022-10-22 LAB — HEMOGLOBIN AND HEMATOCRIT, BLOOD
HCT: 26.9 % — ABNORMAL LOW (ref 36.0–46.0)
Hemoglobin: 8.9 g/dL — ABNORMAL LOW (ref 12.0–15.0)

## 2022-10-22 LAB — MAGNESIUM: Magnesium: 2.2 mg/dL (ref 1.7–2.4)

## 2022-10-22 LAB — PROCALCITONIN: Procalcitonin: 0.35 ng/mL

## 2022-10-22 SURGERY — ESOPHAGOGASTRODUODENOSCOPY (EGD) WITH PROPOFOL
Anesthesia: General

## 2022-10-22 MED ORDER — LIDOCAINE HCL (PF) 2 % IJ SOLN
INTRAMUSCULAR | Status: AC
  Filled 2022-10-22: qty 5

## 2022-10-22 MED ORDER — SODIUM CHLORIDE (PF) 0.9 % IJ SOLN
PREFILLED_SYRINGE | INTRAMUSCULAR | Status: DC | PRN
  Administered 2022-10-22: 2 mL

## 2022-10-22 MED ORDER — PROPOFOL 10 MG/ML IV BOLUS
INTRAVENOUS | Status: DC | PRN
  Administered 2022-10-22 (×2): 20 mg via INTRAVENOUS
  Administered 2022-10-22: 40 mg via INTRAVENOUS

## 2022-10-22 MED ORDER — SODIUM CHLORIDE 0.9 % IV SOLN: INTRAVENOUS | Status: DC | PRN

## 2022-10-22 MED ORDER — SODIUM CHLORIDE 0.9% IV SOLUTION: Freq: Once | INTRAVENOUS | Status: DC

## 2022-10-22 MED ORDER — STERILE WATER FOR IRRIGATION IR SOLN
Status: DC | PRN
  Administered 2022-10-22: 100 mL

## 2022-10-22 MED ORDER — PROPOFOL 500 MG/50ML IV EMUL
INTRAVENOUS | Status: DC | PRN
  Administered 2022-10-22: 145 ug/kg/min via INTRAVENOUS

## 2022-10-22 MED ORDER — PROPOFOL 10 MG/ML IV BOLUS
INTRAVENOUS | Status: AC
  Filled 2022-10-22: qty 40

## 2022-10-22 MED ORDER — GLYCOPYRROLATE 0.2 MG/ML IJ SOLN
INTRAMUSCULAR | Status: DC | PRN
  Administered 2022-10-22: .2 mg via INTRAVENOUS

## 2022-10-22 MED ORDER — EPINEPHRINE 1 MG/10ML IJ SOSY
PREFILLED_SYRINGE | INTRAMUSCULAR | Status: AC
  Filled 2022-10-22: qty 10

## 2022-10-22 MED ORDER — LIDOCAINE HCL (CARDIAC) PF 100 MG/5ML IV SOSY
PREFILLED_SYRINGE | INTRAVENOUS | Status: DC | PRN
  Administered 2022-10-22: 60 mg via INTRAVENOUS
  Administered 2022-10-22: 40 mg via INTRAVENOUS

## 2022-10-22 NOTE — Anesthesia Preprocedure Evaluation (Signed)
Anesthesia Evaluation  Patient identified by MRN, date of birth, ID band Patient awake    Reviewed: Allergy & Precautions, H&P , NPO status , Patient's Chart, lab work & pertinent test results, reviewed documented beta blocker date and time   Airway Mallampati: II   Neck ROM: full    Dental  (+) Edentulous Upper, Edentulous Lower   Pulmonary neg pulmonary ROS, COPD, former smoker   Pulmonary exam normal        Cardiovascular Exercise Tolerance: Poor hypertension, Pt. on medications + CAD and + Peripheral Vascular Disease  negative cardio ROS Normal cardiovascular exam Rhythm:regular Rate:Normal     Neuro/Psych negative neurological ROS  negative psych ROS   GI/Hepatic negative GI ROS, Neg liver ROS,,,  Endo/Other  negative endocrine ROSdiabetes, Well Controlled    Renal/GU Renal diseasenegative Renal ROS  negative genitourinary   Musculoskeletal  (+) Arthritis ,    Abdominal   Peds  Hematology negative hematology ROS (+) Blood dyscrasia, anemia   Anesthesia Other Findings Past Medical History: No date: Arthritis No date: CAD (coronary artery disease) No date: Chronic kidney disease No date: COPD (chronic obstructive pulmonary disease) (HCC) No date: Diabetes mellitus without complication (HCC) No date: Hyperlipidemia No date: Hypertension No date: Restless legs syndrome (RLS) Past Surgical History: No date: ABDOMINAL HYSTERECTOMY No date: APPENDECTOMY 2019: CATARACT EXTRACTION; Bilateral No date: TONSILLECTOMY BMI    Body Mass Index: 26.34 kg/m     Reproductive/Obstetrics negative OB ROS                              Anesthesia Physical Anesthesia Plan  ASA: 3  Anesthesia Plan: General   Post-op Pain Management: Minimal or no pain anticipated   Induction: Intravenous  PONV Risk Score and Plan: 3 and Propofol infusion, TIVA and Ondansetron  Airway Management Planned:  Nasal Cannula  Additional Equipment: None  Intra-op Plan:   Post-operative Plan:   Informed Consent: I have reviewed the patients History and Physical, chart, labs and discussed the procedure including the risks, benefits and alternatives for the proposed anesthesia with the patient or authorized representative who has indicated his/her understanding and acceptance.     Dental Advisory Given  Plan Discussed with: CRNA  Anesthesia Plan Comments: (Discussed risks of anesthesia with patient, including possibility of difficulty with spontaneous ventilation under anesthesia necessitating airway intervention, PONV, and rare risks such as cardiac or respiratory or neurological events, and allergic reactions. Discussed the role of CRNA in patient's perioperative care. Patient understands.)        Anesthesia Quick Evaluation

## 2022-10-22 NOTE — Op Note (Signed)
[facility] Gastroenterology Patient Name: Kelly Rowland Procedure Date: 10/22/2022 2:25 PM MRN: [id number] Account #: [id number] Date of Birth: Sep 01, 1941 Admit Type: Inpatient Age: 81 Room: [facility] ENDO ROOM 3 Gender: Female Note Status: Finalized Instrument Name: Jasper Riling [id number] Procedure:             Colonoscopy Indications:           Hematochezia Providers:             Lucilla Lame MD, MD Referring MD:          Volanda Napoleon, MD (Referring MD) Medicines:             Propofol per Anesthesia Complications:         No immediate complications. Procedure:             Pre-Anesthesia Assessment:                        - Prior to the procedure, a History and Physical was                         performed, and patient medications and allergies were                         reviewed. The patient's tolerance of previous                         anesthesia was also reviewed. The risks and benefits                         of the procedure and the sedation options and risks                         were discussed with the patient. All questions were                         answered, and informed consent was obtained. Prior                         Anticoagulants: The patient has taken no anticoagulant                         or antiplatelet agents. ASA Grade Assessment: II - A                         patient with mild systemic disease. After reviewing                         the risks and benefits, the patient was deemed in                         satisfactory condition to undergo the procedure.                        After obtaining informed consent, the colonoscope was                         passed under direct vision. Throughout the procedure,  the patient's blood pressure, pulse, and oxygen                         saturations were monitored continuously. The                         Colonoscope was introduced through the anus and                          advanced to the the cecum, identified by appendiceal                         orifice and ileocecal valve. The colonoscopy was                         performed without difficulty. The patient tolerated                         the procedure well. The quality of the bowel                         preparation was good. Findings:      The perianal and digital rectal examinations were normal.      Non-bleeding internal hemorrhoids were found during retroflexion. The       hemorrhoids were Grade II (internal hemorrhoids that prolapse but reduce       spontaneously). Impression:            - Non-bleeding internal hemorrhoids.                        - No specimens collected. Recommendation:        - Discharge patient to home.                        - Resume previous diet.                        - Continue present medications. Procedure Code(s):     --- Professional ---                        [phone], Colonoscopy, flexible; diagnostic, including                         collection of specimen(s) by brushing or washing, when                         performed (separate procedure) Diagnosis Code(s):     --- Professional ---                        K92.1, Melena (includes Hematochezia) CPT copyright 2022 American Medical Association. All rights reserved. The codes documented in this report are preliminary and upon coder review may  be revised to meet current compliance requirements. Midge Minium MD, MD 10/22/2022 3:31:05 PM This report has been signed electronically. Number of Addenda: 0 Note Initiated On: 10/22/2022 2:25 PM Scope Withdrawal Time: 0 hours 5 minutes 13 seconds  Total Procedure Duration: 0 hours 19 minutes 19 seconds  Estimated Blood Loss:  Estimated blood loss: none.      [facility]

## 2022-10-22 NOTE — Progress Notes (Addendum)
PROGRESS NOTE    Kelly Rowland  [id number] DOB: 1941-09-14 DOA: 10/17/2022 PCP: Volanda Napoleon, MD  214A/214A-AA  LOS: 5 days   Brief hospital course:   Assessment & Plan: Kelly Rowland is a 81 y.o. 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   Patient states she took [facility] powder last week for her back pain. She denies any vomiting or any bright red blood per rectum.    [name] -- baseline hemoglobin 15.0 in June 2022 --11/5--EGD - Bleeding friable duodenal mucosa. Injected. Treated with bipolar cautery. --continues to drop Hgb, s/p 3u pRBC total so far Plan: --EGD and colonoscopy today  Acute blood loss anemia --presumed due to GI bleed.  Anemia workup showed no def in iron, vit B12 or folate --s/p 3u pRBC total so far Plan: --4th unit of pRBC today for Hgb 6.5 --transfuse to keep Hgb >7  acute hypoxic respiratory failure suspect acute pulmonary edema with volume overload CHF ruled out -- developed hypoxia on 11/5.  chest x-ray showed bilateral pulmonary edema.  Likely from fluid overload from IVF and blood transfusion -- IV Lasix 20 mg x2 --Echo showed normal systolic and diastolic function --weaned down to room air   Chest pain, ACS ruled out history of CAD status post stent --trop neg.  Cardiology consulted, ruled out ACS.  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  Baseline creatinine 1.4-- 1.7  PAD Carotid artery stenosis --hold home ASA --cont statin   History of hypertension --hold home coreg and amlodipine  DM2 with hypoglycemia and hyperglycemia  --cont gargine 10u daily --ACHS and SSI  Chronic back pain on chronic opioids --switch home  percocet to oxycodone to avoid tylenol  Dysuria --pending urine cx  Fever --has intermittent fevers.  Blood cx neg so far.  Urine cx only 30,000 colonies.  COVID neg. --pending RVP  Hypophos --supplemented   DVT prophylaxis: SCD/Compression stockings Code Status: Full code  Family Communication:  Level of care: Med-Surg Dispo:   The patient is from: home Anticipated d/c is to: home Anticipated d/c date is: 1-2 days   Subjective and Interval History:  Pt continued to have intermittent fevers, currently no obvious source.  Hgb dropped again to 6.5, gave 4th unit of pRBC today.  Complained of bilateral foot pain and requested heating pads.  Going for EGD and colonoscopy today.   Objective: Vitals:   10/22/22 1542 10/22/22 1550 10/22/22 1602 10/22/22 1638  BP: (!) 152/66 (!) 158/63 (!) 176/64 (!) 161/58  Pulse: 100 98 93 91  Resp: 16 (!) 24 (!) 23 20  Temp:    99.8 F (37.7 C)  TempSrc:      SpO2: (!) 86% (!) 89% 90% 100%  Weight:      Height:        Intake/Output Summary (Last 24 hours) at 10/22/2022 1756 Last data filed at 10/22/2022 1211 Gross per 24 hour  Intake 4643 ml  Output --  Net 4643 ml   Filed Weights   10/17/22 1333  Weight: 65.3 kg    Examination:   Constitutional: NAD, AAOx3 HEENT: conjunctivae and lids normal, EOMI CV: No cyanosis.   RESP: normal respiratory effort, on RA Neuro: II - XII grossly intact.   Psych: Normal mood and  affect.  Appropriate judgement and reason   Data Reviewed: I have personally reviewed labs and imaging studies  Time spent: 50 minutes  Darlin Priestly, MD [facility] If 7PM-7AM, please contact night-coverage 10/22/2022, 5:56 PM

## 2022-10-22 NOTE — Progress Notes (Signed)
Patient returned from Endo dept

## 2022-10-22 NOTE — Transfer of Care (Signed)
Immediate Anesthesia Transfer of Care Note  Patient: Kelly Rowland  Procedure(s) Performed: ESOPHAGOGASTRODUODENOSCOPY (EGD) WITH PROPOFOL COLONOSCOPY  Patient Location: Endoscopy Unit  Anesthesia Type:General  Level of Consciousness: sedated  Airway & Oxygen Therapy: Patient Spontanous Breathing and Patient connected to face mask oxygen  Post-op Assessment: Report given to RN and Post -op Vital signs reviewed and stable  Post vital signs: Reviewed and stable  Last Vitals:  Vitals Value Taken Time  BP 156/70   Temp    Pulse    Resp 24   SpO2 97     Last Pain:  Vitals:   10/22/22 1003  TempSrc:   PainSc: Asleep      Patients Stated Pain Goal: 0 (10/22/22 0138)  Complications: No notable events documented.

## 2022-10-22 NOTE — Progress Notes (Signed)
Mobility Specialist - Progress Note   10/22/22 1300  Mobility  Activity Contraindicated/medical hold     Mobility initially contraindicated d/t low HgB this AM (6.5); blood transfusion given and HgB now 8.9. Pt being transferred for EGD procedure at time of 2nd attempt. Will attempt another date as appropriate.    Filiberto Pinks Mobility Specialist 10/22/22, 1:51 PM

## 2022-10-22 NOTE — Anesthesia Procedure Notes (Signed)
Procedure Name: General with mask airway Date/Time: 10/22/2022 2:49 PM  Performed by: Mohammed Kindle, CRNAPre-anesthesia Checklist: Patient identified, Emergency Drugs available, Suction available and Patient being monitored Patient Re-evaluated:Patient Re-evaluated prior to induction Oxygen Delivery Method: Simple face mask Induction Type: IV induction Placement Confirmation: positive ETCO2, CO2 detector and breath sounds checked- equal and bilateral Dental Injury: Teeth and Oropharynx as per pre-operative assessment

## 2022-10-22 NOTE — Op Note (Signed)
[facility] Gastroenterology Patient Name: Kelly Rowland Procedure Date: 10/22/2022 2:28 PM MRN: [id number] Account #: [id number] Date of Birth: Feb 23, 1941 Admit Type: Inpatient Age: 81 Room: [facility] ENDO ROOM 3 Gender: Female Note Status: Finalized Instrument Name: Upper Endoscope [id number] Procedure:             Upper GI endoscopy Indications:           Melena Providers:             Midge Minium MD, MD Referring MD:          Lake Bells, MD (Referring MD) Medicines:             Propofol per Anesthesia Complications:         No immediate complications. Procedure:             Pre-Anesthesia Assessment:                        - Prior to the procedure, a History and Physical was                         performed, and patient medications and allergies were                         reviewed. The patient's tolerance of previous                         anesthesia was also reviewed. The risks and benefits                         of the procedure and the sedation options and risks                         were discussed with the patient. All questions were                         answered, and informed consent was obtained. Prior                         Anticoagulants: The patient has taken no anticoagulant                         or antiplatelet agents. ASA Grade Assessment: II - A                         patient with mild systemic disease. After reviewing                         the risks and benefits, the patient was deemed in                         satisfactory condition to undergo the procedure.                        After obtaining informed consent, the endoscope was                         passed under direct vision. Throughout the procedure,  the patient's blood pressure, pulse, and oxygen                         saturations were monitored continuously. The Endoscope                         was introduced through the mouth, and advanced to  the                         second part of duodenum. The upper GI endoscopy was                         accomplished without difficulty. The patient tolerated                         the procedure well. Findings:      The examined esophagus was normal.      The entire examined stomach was normal.      One oozing cratered duodenal ulcer with a visible vessel was found in       the second portion of the duodenum. Area was successfully injected with       2 mL of a 0.1 mg/mL solution of epinephrine for hemostasis. Coagulation       for hemostasis using monopolar probe was successful. Impression:            - Normal esophagus.                        - Normal stomach.                        - Oozing duodenal ulcer with a visible vessel.                         Injected. Treated with a monopolar probe.                        - No specimens collected. Recommendation:        - Return patient to hospital ward for ongoing care.                        - Resume previous diet.                        - Continue present medications.                        - Perform a colonoscopy today. 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 All rights reserved. The codes documented in this report are preliminary and upon coder review may  be revised to meet current compliance requirements. Midge Minium MD, MD 10/22/2022 3:01:28 PM This report has been signed electronically. Number of Addenda: 0 Note Initiated On: 10/22/2022 2:28 PM Estimated Blood Loss:  Estimated blood loss: none.      [facility]

## 2022-10-22 NOTE — Progress Notes (Signed)
Patient transported to Endo department for procedure

## 2022-10-22 NOTE — Consult Note (Signed)
PHARMACY CONSULT NOTE  Pharmacy Consult for Electrolyte Monitoring and Replacement   Recent Labs: Potassium (mmol/L)  Date Value  10/22/2022 4.0   Magnesium (mg/dL)  Date Value  88/10/314 2.2   Calcium (mg/dL)  Date Value  94/58/5929 8.1 (L)   Albumin (g/dL)  Date Value  24/46/2863 3.2 (L)   Phosphorus (mg/dL)  Date Value  81/77/1165 2.9   Sodium (mmol/L)  Date Value  10/22/2022 141     Assessment: 81yo F w/ h/o CAD, CKD, COPD, DM, HTN, & HLD who presents with dark stool. At this time unable to use gut for repletion, will utilize IV instead. Pharmacy consulted for mgmt of electrolytes.   Goal of Therapy:  Electrolytes WNL  Plan:  No electrolyte replacement warranted for today CTM and replace PRN. F/u labs in  AM  Lowella Bandy, PharmD Clinical Pharmacist 10/22/2022 8:09 AM

## 2022-10-22 NOTE — Progress Notes (Signed)
Colon prep completed with patient drinking all PO NuLTELY solution ( ). Patient continues to have frequent liquid BMs that are dark red in colour.

## 2022-10-22 NOTE — Progress Notes (Addendum)
PT Cancellation Note  Patient Details Name: Kelly Rowland MRN: [id number] DOB: 10-08-1941   Cancelled Treatment:     PT did not provide skilled PT Interventions today 2/2 pt receiving Blood transfusion, HgB 6.5 in the AM and is scheduled for procedures after the transfusion. Nurse advised to hold the pt. Pt deferred pt for today due to these reasons. Will attempt tomorrow if pt is appropriate.    Janet Berlin PT DPT 2:59 PM,10/22/22

## 2022-10-22 NOTE — Plan of Care (Signed)

## 2022-10-22 NOTE — Progress Notes (Signed)
       CROSS COVER NOTE  NAME: Kelly Rowland MRN: [id number] DOB : 1941/10/01    Time of Service   0430 am  HPI/Events of Note   Notiied by nurse low grad fever and HGB 6.5  Assessment and  Interventions   Assessment:  Plan: Give acetaminophen as ordered X Type and cross - transfuse 1 unit PRBCs        Donnie Mesa NP [facility]

## 2022-10-23 ENCOUNTER — Encounter: Payer: Self-pay | Admitting: Gastroenterology

## 2022-10-23 DIAGNOSIS — K269 Duodenal ulcer, unspecified as acute or chronic, without hemorrhage or perforation: Secondary | ICD-10-CM

## 2022-10-23 DIAGNOSIS — K922 Gastrointestinal hemorrhage, unspecified: Secondary | ICD-10-CM | POA: Diagnosis not present

## 2022-10-23 LAB — BASIC METABOLIC PANEL
Anion gap: 7 (ref 5–15)
BUN: 28 mg/dL — ABNORMAL HIGH (ref 8–23)
CO2: 24 mmol/L (ref 22–32)
Calcium: 8.1 mg/dL — ABNORMAL LOW (ref 8.9–10.3)
Chloride: 107 mmol/L (ref 98–111)
Creatinine, Ser: 1.09 mg/dL — ABNORMAL HIGH (ref 0.44–1.00)
GFR, Estimated: 52 mL/min — ABNORMAL LOW (ref >60)
Glucose, Bld: 154 mg/dL — ABNORMAL HIGH (ref 70–99)
Potassium: 3.9 mmol/L (ref 3.5–5.1)
Sodium: 138 mmol/L (ref 135–145)

## 2022-10-23 LAB — CBC
HCT: 23.5 % — ABNORMAL LOW (ref 36.0–46.0)
Hemoglobin: 7.6 g/dL — ABNORMAL LOW (ref 12.0–15.0)
MCH: 29 pg (ref 26.0–34.0)
MCHC: 32.3 g/dL (ref 30.0–36.0)
MCV: 89.7 fL (ref 80.0–100.0)
Platelets: 201 10*3/uL (ref 150–400)
RBC: 2.62 MIL/uL — ABNORMAL LOW (ref 3.87–5.11)
RDW: 15.9 % — ABNORMAL HIGH (ref 11.5–15.5)
WBC: 12.2 10*3/uL — ABNORMAL HIGH (ref 4.0–10.5)
nRBC: 0 % (ref 0.0–0.2)

## 2022-10-23 LAB — BPAM RBC
Blood Product Expiration Date: 202312132359
ISSUE DATE / TIME: 202311090822
Unit Type and Rh: 1700

## 2022-10-23 LAB — URINE CULTURE: Culture: 30000 — AB

## 2022-10-23 LAB — GLUCOSE, CAPILLARY
Glucose-Capillary: 139 mg/dL — ABNORMAL HIGH (ref 70–99)
Glucose-Capillary: 115 mg/dL — ABNORMAL HIGH (ref 70–99)
Glucose-Capillary: 102 mg/dL — ABNORMAL HIGH (ref 70–99)
Glucose-Capillary: 76 mg/dL (ref 70–99)

## 2022-10-23 LAB — TYPE AND SCREEN
ABO/RH(D): B NEG
Antibody Screen: NEGATIVE
Unit division: 0

## 2022-10-23 LAB — MAGNESIUM: Magnesium: 2.3 mg/dL (ref 1.7–2.4)

## 2022-10-23 MED ORDER — GABAPENTIN 100 MG PO CAPS
100.0000 mg | ORAL_CAPSULE | Freq: Three times a day (TID) | ORAL | Status: DC
  Administered 2022-10-23 – 2022-10-25 (×7): 100 mg via ORAL
  Filled 2022-10-23 (×6): qty 1

## 2022-10-23 NOTE — Progress Notes (Signed)
PROGRESS NOTE    Kelly Rowland  [id number] DOB: [birth year] DOA: 10/17/2022 PCP: Lake Bells, MD  214A/214A-AA  LOS: 6 days   Brief hospital course:   Assessment & Plan: Kelly Rowland is a 81 y.o. 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   Patient states she took [facility] powder last week for her back pain. She denies any vomiting or any bright red blood per rectum.    [name] -- baseline hemoglobin 15.0 in June 2022 --11/5--EGD - Bleeding friable duodenal mucosa. Injected. Treated with bipolar cautery. --continues to drop Hgb, s/p 4u pRBC total so far --repeat EGD on 11/9 found oozing of blood from a duodenal ulcer which was treated with epi injection and cautery.  Colonoscopy no acute finding. Plan: --monitor for black or bloody stool --if Hgb continues to drop, GI recommended vascular surgery for embolization   Acute blood loss anemia --presumed due to GI bleed.  Anemia workup showed no def in iron, vit B12 or folate --s/p 4u pRBC total so far Plan: --transfuse to keep Hgb >7 --monitor Hgb for stability   acute hypoxic respiratory failure suspect acute pulmonary edema with volume overload CHF ruled out -- developed hypoxia on 11/5.  chest x-ray showed bilateral pulmonary edema.  Likely from fluid overload from IVF and blood transfusion -- IV Lasix 20 mg x2 --Echo showed normal systolic and diastolic function --weaned down to room air   Chest pain, ACS ruled out history of CAD status post stent --trop neg.  Cardiology consulted, ruled out ACS.  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  Baseline creatinine 1.4-- 1.7  PAD Carotid artery stenosis --hold home  ASA --cont statin   History of hypertension --hold home coreg and amlodipine  DM2 with hypoglycemia and hyperglycemia  --cont gargine 10u daily --ACHS and SSI  Chronic back pain on chronic opioids --switch home percocet to oxycodone to avoid tylenol  Dysuria --pending urine cx  Fever --has intermittent fevers.  Blood cx neg so far.  Urine cx only 30,000 colonies.  COVID and RVP neg.  Hypophos --supplemented  Bilateral foot and ankle pain --this was present prior to arrival, and pt already had extensive outpatient workup without finding a cause for this pain. --increase gabapentin from 100 mg nightly to TID.   DVT prophylaxis: SCD/Compression stockings Code Status: Full code  Family Communication:  Level of care: Med-Surg Dispo:   The patient is from: home Anticipated d/c is to: SNF rehab Anticipated d/c date is: 1-2 days   Subjective and Interval History:  Pt complained of severe bilateral foot pain, cried out with even minimal flexion of both ankles, and light palpation of dorsum.     Objective: Vitals:   10/22/22 1942 10/23/22 0354 10/23/22 0836 10/23/22 1527  BP: (!) 163/59 (!) 161/58 (!) 159/84 (!) 162/61  Pulse: 88 87 95 88  Resp: 18 20 20 18   Temp: 98.5 F (36.9 C) 99.1 F (37.3 C) 99.9 F (37.7 C) 98.7 F (37.1 C)  TempSrc: Oral Oral  Oral  SpO2: 91% 95% 91% 94%  Weight:      Height:        Intake/Output Summary (Last 24 hours) at 10/23/2022 1708 Last data filed at 10/23/2022 0548 Gross per 24 hour  Intake 0 ml  Output --  Net 0 ml   Filed Weights   10/17/22 1333  Weight: 65.3 kg    Examination:   Constitutional: NAD, AAOx3 HEENT: conjunctivae and lids normal, EOMI CV: No cyanosis.   RESP: normal respiratory effort, on 2L Extremities: pain with any movement of both ankles, no erythema noted. SKIN: warm, dry Neuro: II - XII grossly intact.   Psych: depressed mood and affect.     Data Reviewed: I have personally reviewed labs and  imaging studies  Time spent: 35 minutes  Darlin Priestly, MD [facility] If 7PM-7AM, please contact night-coverage 10/23/2022, 5:08 PM

## 2022-10-23 NOTE — Progress Notes (Signed)
Midge Minium, MD [facility]   [address], [address] [place], [place] [address] Phone: [phone] Fax : [phone]   Subjective: The patient denies any bleeding such as black stools or bloody stools. Had an EGD with injection of epi and cautery of the DU. The Hb has gone down. She has no abdominal pain.   Objective: Vital signs in last 24 hours: Vitals:   10/22/22 1638 10/22/22 1942 10/23/22 0354 10/23/22 0836  BP: (!) 161/58 (!) 163/59 (!) 161/58 (!) 159/84  Pulse: 91 88 87 95  Resp: 20 18 20 20   Temp: 99.8 F (37.7 C) 98.5 F (36.9 C) 99.1 F (37.3 C) 99.9 F (37.7 C)  TempSrc:  Oral Oral   SpO2: 100% 91% 95% 91%  Weight:      Height:       Weight change:   Intake/Output Summary (Last 24 hours) at 10/23/2022 1153 Last data filed at 10/23/2022 0548 Gross per 24 hour  Intake 323 ml  Output --  Net 323 ml     Exam: Heart:: Regular rate and rhythm, S1S2 present, or without murmur or extra heart sounds Lungs: normal and clear to auscultation and percussion Abdomen: soft, nontender, normal bowel sounds   Lab Results: @LABTEST2 @ Micro Results: Recent Results (from the past 240 hour(s))  Culture, blood (Routine X 2) w Reflex to ID Panel     Status: None (Preliminary result)   Collection Time: 10/21/22 10:31 AM   Specimen: BLOOD  Result Value Ref Range Status   Specimen Description BLOOD BLOOD LEFT HAND  Final   Special Requests   Final    BOTTLES DRAWN AEROBIC AND ANAEROBIC Blood Culture adequate volume   Culture   Final    NO GROWTH 2 DAYS Performed at [facility], [address], [place], [address] [place]    Report Status PENDING  Incomplete  Culture, blood (Routine X 2) w Reflex to ID Panel     Status: None (Preliminary result)   Collection Time: 10/21/22 10:31 AM   Specimen: BLOOD  Result Value Ref Range Status   Specimen Description BLOOD BLOOD RIGHT HAND  Final   Special Requests   Final    BOTTLES DRAWN AEROBIC AND ANAEROBIC Blood Culture  adequate volume   Culture   Final    NO GROWTH 2 DAYS Performed at [facility], [address], [place], [address] [place]    Report Status PENDING  Incomplete  Urine Culture     Status: Abnormal (Preliminary result)   Collection Time: 10/21/22  1:00 PM   Specimen: Urine, Random  Result Value Ref Range Status   Specimen Description   Final    URINE, RANDOM Performed at [facility], [address], [place], [address] [place]    Special Requests   Final    NONE Performed at [facility], [address], [place], [address] [place]    Culture (A)  Final    30,000 COLONIES/mL GRAM NEGATIVE RODS IDENTIFICATION AND SUSCEPTIBILITIES TO FOLLOW Performed at [facility], [address], [place], [address] [place]    Report Status PENDING  Incomplete  SARS Coronavirus 2 by RT PCR (hospital order, performed in [facility] hospital lab) *cepheid single result test* Anterior Nasal Swab     Status: None   Collection Time: 10/22/22 12:18 PM   Specimen: Anterior Nasal Swab  Result Value Ref Range Status   SARS Coronavirus 2 by RT PCR NEGATIVE NEGATIVE Final    Comment: (  NOTE) SARS-CoV-2 target nucleic acids are NOT DETECTED.  The SARS-CoV-2 RNA is generally detectable in upper and lower respiratory specimens during the acute phase of infection. The lowest concentration of SARS-CoV-2 viral copies this assay can detect is 250 copies / mL. A negative result does not preclude SARS-CoV-2 infection and should not be used as the sole basis for treatment or other patient management decisions.  A negative result may occur with improper specimen collection / handling, submission of specimen other than nasopharyngeal swab, presence of viral mutation(s) within the areas targeted by this assay, and inadequate number of viral copies (<250 copies / mL). A negative result must be combined with clinical observations, patient history, and epidemiological  information.  Fact Sheet for Patients:   https://www.patel.info/  Fact Sheet for Healthcare Providers: https://hall.com/  This test is not yet approved or  cleared by the [place] FDA and has been authorized for detection and/or diagnosis of SARS-CoV-2 by FDA under an Emergency Use Authorization (EUA).  This EUA will remain in effect (meaning this test can be used) for the duration of the COVID-19 declaration under Section 564(b)(1) of the Act, 21 U.S.C. section 360bbb-3(b)(1), unless the authorization is terminated or revoked sooner.  Performed at [facility], [place], [place] [address]   Respiratory (~20 pathogens) panel by PCR     Status: None   Collection Time: 10/22/22 12:18 PM   Specimen: Nasopharyngeal Swab; Respiratory  Result Value Ref Range Status   Adenovirus NOT DETECTED NOT DETECTED Final   Coronavirus 229E NOT DETECTED NOT DETECTED Final    Comment: (NOTE) The Coronavirus on the Respiratory Panel, DOES NOT test for the novel  Coronavirus (2019 nCoV)    Coronavirus HKU1 NOT DETECTED NOT DETECTED Final   Coronavirus NL63 NOT DETECTED NOT DETECTED Final   Coronavirus OC43 NOT DETECTED NOT DETECTED Final   Metapneumovirus NOT DETECTED NOT DETECTED Final   Rhinovirus / Enterovirus NOT DETECTED NOT DETECTED Final   Influenza A NOT DETECTED NOT DETECTED Final   Influenza B NOT DETECTED NOT DETECTED Final   Parainfluenza Virus 1 NOT DETECTED NOT DETECTED Final   Parainfluenza Virus 2 NOT DETECTED NOT DETECTED Final   Parainfluenza Virus 3 NOT DETECTED NOT DETECTED Final   Parainfluenza Virus 4 NOT DETECTED NOT DETECTED Final   Respiratory Syncytial Virus NOT DETECTED NOT DETECTED Final   Bordetella pertussis NOT DETECTED NOT DETECTED Final   Bordetella Parapertussis NOT DETECTED NOT DETECTED Final   Chlamydophila pneumoniae NOT DETECTED NOT DETECTED Final   Mycoplasma pneumoniae NOT DETECTED  NOT DETECTED Final    Comment: Performed at [facility], [place], [place], [place] [address]   Studies/Results: No results found. Medications: I have reviewed the patient's current medications. Scheduled Meds:  sodium chloride   Intravenous Once   cholecalciferol  2,000 Units Oral Daily   cyanocobalamin  500 mcg Oral Daily   folic acid  [id number] mcg Oral Daily   gabapentin  100 mg Oral TID   glimepiride  4 mg Oral Daily   insulin aspart  0-15 Units Subcutaneous TID WC   insulin glargine-yfgn  10 Units Subcutaneous Daily   mometasone-formoterol  2 puff Inhalation BID   senna  1 tablet Oral BID   simvastatin  20 mg Oral QHS   Continuous Infusions: PRN Meds:.acetaminophen **OR** acetaminophen, albuterol, bisacodyl, hydrALAZINE, ondansetron **OR** ondansetron (ZOFRAN) IV, mouth rinse, oxyCODONE, polyethylene glycol, traZODone   Assessment: Principal Problem:   GI bleed Active Problems:  Hypokalemia   Normocytic anemia   UGIB (upper gastrointestinal bleed)   Acute upper GI bleeding   Duodenal ulcer hemorrhage   Hematochezia    Plan: The patient has a decreased Hb and no sign of bleeding. If the Hb continues to drop then I would recommend vascular surgery for embolization since 2 attempts have been made to stop the bleeding.    LOS: 6 days   Lewayne Bunting 10/23/2022, 11:53 AM Pager [phone] 7am-5pm  Check AMION for 5pm -7am coverage and on weekends

## 2022-10-23 NOTE — Consult Note (Signed)
PHARMACY CONSULT NOTE  Pharmacy Consult for Electrolyte Monitoring and Replacement   Recent Labs: Potassium (mmol/L)  Date Value  10/23/2022 3.9   Magnesium (mg/dL)  Date Value  29/24/4628 2.3   Calcium (mg/dL)  Date Value  63/81/7711 8.1 (L)   Albumin (g/dL)  Date Value  65/79/0383 3.2 (L)   Phosphorus (mg/dL)  Date Value  33/83/2919 2.9   Sodium (mmol/L)  Date Value  10/23/2022 138     Assessment: 81yo F w/ h/o CAD, CKD, COPD, DM, HTN, & HLD who presents with dark stool. At this time unable to use gut for repletion, will utilize IV instead. Pharmacy consulted for mgmt of electrolytes.   Goal of Therapy:  Electrolytes WNL  Plan:  No electrolyte replacement warranted for today CTM and replace PRN. F/u labs in  AM  Lowella Bandy, PharmD Clinical Pharmacist 10/23/2022 7:03 AM

## 2022-10-23 NOTE — Progress Notes (Signed)
Physical Therapy Treatment Patient Details Name: Kelly Rowland MRN: [id number] DOB: 1941/02/08 Today's Date: 10/23/2022   History of Present Illness Pt is a 81 y/o F admitted on 10/17/22 after presenting to the ED with c/o generalized weakness, dizziness, & black tarry stools x 2 days. Pt is being treated for GI bleed. PMH: DJD, CAD, CKD, DM2, peptic ulcer disease, HTN, COPD    PT Comments    Patient in bed upon PT arrival. Agreeable to therapy despite having "extreme foot pain". Patient reports her gout is acting up. Agreeable to supine strengthening interventions. Attempted self donning of socks however unable to position self requiring max A for donning. Requires min A to sit EOB with extreme posterior weight lean requiring SUE support. Seated balance interventions performed. Attempted standing but patient refused due to  10/10 foot pain. Patient returned to bed. Patient is very upset about her inability to stand and requires consoling. Current POC remains appropriate at this time.    Recommendations for follow up therapy are one component of a multi-disciplinary discharge planning process, led by the attending physician.  Recommendations may be updated based on patient status, additional functional criteria and insurance authorization.  Follow Up Recommendations  Skilled nursing-short term rehab (<3 hours/day) Can patient physically be transported by private vehicle: No   Assistance Recommended at Discharge Intermittent Supervision/Assistance  Patient can return home with the following A lot of help with walking and/or transfers;A lot of help with bathing/dressing/bathroom;Assistance with cooking/housework;Direct supervision/assist for medications management;Direct supervision/assist for financial management;Assist for transportation;Help with stairs or ramp for entrance   Equipment Recommendations  None recommended by PT    Recommendations for Other Services       Precautions /  Restrictions Precautions Precautions: None Restrictions Weight Bearing Restrictions: No     Mobility  Bed Mobility Overal bed mobility: Needs Assistance Bed Mobility: Supine to Sit, Sit to Supine     Supine to sit: Min assist Sit to supine: Min assist   General bed mobility comments: fatigues, painful to feet when moving    Transfers Overall transfer level: Needs assistance Equipment used: Rolling walker (2 wheels) Transfers: Sit to/from Stand Sit to Stand: Mod assist           General transfer comment: patient attempt STS: extreme 10/10 pain in feet when weightbearing;    Ambulation/Gait                   Stairs             Wheelchair Mobility    Modified Rankin (Stroke Patients Only)       Balance Overall balance assessment: Needs assistance Sitting-balance support: Single extremity supported Sitting balance-Leahy Scale: Fair Sitting balance - Comments: extreme posterior LOB; requires SUE support to maintain position Postural control: Posterior lean     Standing balance comment: unable to asssess due to pain in feet                            Cognition Arousal/Alertness: Awake/alert Behavior During Therapy: WFL for tasks assessed/performed Overall Cognitive Status: Within Functional Limits for tasks assessed                                 General Comments: very upset she is in so much pain when attempting stand        Exercises General Exercises - Lower Extremity Ankle  Circles/Pumps: AROM, Strengthening, Both, 20 reps, Supine Gluteal Sets: Strengthening, Both, 10 reps, Supine Long Arc Quad: Strengthening, Both, 10 reps, Seated Heel Slides: Strengthening, Both, 10 reps, Seated Hip ABduction/ADduction: Strengthening, Both, 10 reps, Supine Other Exercises Other Exercises: Patient educated on need to get out of bed, attempted self donning of socks however unable to perform without max A. Patient performed  supine and seated EOB positioning. Unable to tolerate standing this session.    General Comments        Pertinent Vitals/Pain Pain Assessment Pain Assessment: 0-10 Pain Score: 10-Worst pain ever Pain Location: feet bilaterally Pain Descriptors / Indicators: Constant, Sharp, Stabbing Pain Intervention(s): Limited activity within patient's tolerance, Repositioned    Home Living                          Prior Function            PT Goals (current goals can now be found in the care plan section) Acute Rehab PT Goals Patient Stated Goal: feel better. PT Goal Formulation: With patient Time For Goal Achievement: 11/01/22 Potential to Achieve Goals: Good Progress towards PT goals: Progressing toward goals    Frequency    Min 2X/week      PT Plan Current plan remains appropriate    Co-evaluation              AM-PAC PT "6 Clicks" Mobility   Outcome Measure  Help needed turning from your back to your side while in a flat bed without using bedrails?: A Little Help needed moving from lying on your back to sitting on the side of a flat bed without using bedrails?: A Lot Help needed moving to and from a bed to a chair (including a wheelchair)?: A Lot Help needed standing up from a chair using your arms (e.g., wheelchair or bedside chair)?: A Lot Help needed to walk in hospital room?: Total Help needed climbing 3-5 steps with a railing? : Total 6 Click Score: 11    End of Session Equipment Utilized During Treatment: Gait belt;Oxygen Activity Tolerance: Patient limited by pain Patient left: in bed;with bed alarm set Nurse Communication: Mobility status PT Visit Diagnosis: Muscle weakness (generalized) (M62.81);Unsteadiness on feet (R26.81);Difficulty in walking, not elsewhere classified (R26.2)     Time: 9323-5573 PT Time Calculation (min) (ACUTE ONLY): 25 min  Charges:  $Therapeutic Exercise: 8-22 mins $Therapeutic Activity: 8-22 mins                      Precious Bard, PT, DPT  10/23/2022, 10:02 AM

## 2022-10-23 NOTE — TOC Progression Note (Addendum)
Transition of Care [facility]) - Progression Note    Patient Details  Name: Kelly Rowland MRN: [id number] Date of Birth: [birth year]  Transition [facility]) CM/SW Contact  Liliana Cline, LCSW Phone Number: 10/23/2022, 2:16 PM  Clinical Narrative:    Spoke to [name] with [facility] who requested an update.  Checked with MD then informed [name] that if patients Hgb is stable, she can DC over the weekend.  [name] checked with their Director of Nursing who stated [facility] would prefer patient stay over weekend with Hgb issues as they have more staff there on the weekdays. Updated MD.   Expected Discharge Plan: Home w Home Health Services Barriers to Discharge: Continued Medical Work up  Expected Discharge Plan and Services Expected Discharge Plan: Home w Home Health Services       Living arrangements for the past 2 months: Single Family Home                           HH Arranged: PT [facility] Agency: [facility] Date HH Agency Contacted: 10/18/22   Representative spoke with at [facility] Agency: [name]   Social Determinants of Health (SDOH) Interventions    Readmission Risk Interventions     No data to display

## 2022-10-23 NOTE — Plan of Care (Signed)

## 2022-10-24 ENCOUNTER — Inpatient Hospital Stay: Payer: Medicare Other

## 2022-10-24 DIAGNOSIS — K922 Gastrointestinal hemorrhage, unspecified: Secondary | ICD-10-CM | POA: Diagnosis not present

## 2022-10-24 LAB — BASIC METABOLIC PANEL
Anion gap: 6 (ref 5–15)
BUN: 30 mg/dL — ABNORMAL HIGH (ref 8–23)
CO2: 26 mmol/L (ref 22–32)
Calcium: 8.1 mg/dL — ABNORMAL LOW (ref 8.9–10.3)
Chloride: 105 mmol/L (ref 98–111)
Creatinine, Ser: 1.09 mg/dL — ABNORMAL HIGH (ref 0.44–1.00)
GFR, Estimated: 52 mL/min — ABNORMAL LOW (ref >60)
Glucose, Bld: 119 mg/dL — ABNORMAL HIGH (ref 70–99)
Potassium: 3.9 mmol/L (ref 3.5–5.1)
Sodium: 137 mmol/L (ref 135–145)

## 2022-10-24 LAB — CBC
HCT: 24.4 % — ABNORMAL LOW (ref 36.0–46.0)
Hemoglobin: 7.9 g/dL — ABNORMAL LOW (ref 12.0–15.0)
MCH: 29.3 pg (ref 26.0–34.0)
MCHC: 32.4 g/dL (ref 30.0–36.0)
MCV: 90.4 fL (ref 80.0–100.0)
Platelets: 213 10*3/uL (ref 150–400)
RBC: 2.7 MIL/uL — ABNORMAL LOW (ref 3.87–5.11)
RDW: 15.7 % — ABNORMAL HIGH (ref 11.5–15.5)
WBC: 12.6 10*3/uL — ABNORMAL HIGH (ref 4.0–10.5)
nRBC: 0 % (ref 0.0–0.2)

## 2022-10-24 LAB — GLUCOSE, CAPILLARY
Glucose-Capillary: 136 mg/dL — ABNORMAL HIGH (ref 70–99)
Glucose-Capillary: 137 mg/dL — ABNORMAL HIGH (ref 70–99)
Glucose-Capillary: 106 mg/dL — ABNORMAL HIGH (ref 70–99)
Glucose-Capillary: 81 mg/dL (ref 70–99)

## 2022-10-24 LAB — MAGNESIUM: Magnesium: 2.3 mg/dL (ref 1.7–2.4)

## 2022-10-24 MED ORDER — AMLODIPINE BESYLATE 5 MG PO TABS
5.0000 mg | ORAL_TABLET | Freq: Every day | ORAL | Status: DC
  Administered 2022-10-24: 5 mg via ORAL
  Filled 2022-10-24: qty 1

## 2022-10-24 MED ORDER — CARVEDILOL 25 MG PO TABS
25.0000 mg | ORAL_TABLET | Freq: Two times a day (BID) | ORAL | Status: DC
  Administered 2022-10-24: 25 mg via ORAL
  Filled 2022-10-24: qty 1

## 2022-10-24 MED ORDER — IPRATROPIUM-ALBUTEROL 0.5-2.5 (3) MG/3ML IN SOLN
3.0000 mL | Freq: Four times a day (QID) | RESPIRATORY_TRACT | Status: DC
  Administered 2022-10-24 – 2022-10-25 (×3): 3 mL via RESPIRATORY_TRACT
  Filled 2022-10-24 (×2): qty 3

## 2022-10-24 MED ORDER — FUROSEMIDE 10 MG/ML IJ SOLN
40.0000 mg | Freq: Once | INTRAMUSCULAR | Status: AC
  Administered 2022-10-24: 40 mg via INTRAVENOUS
  Filled 2022-10-24: qty 4

## 2022-10-24 NOTE — Progress Notes (Signed)
PROGRESS NOTE    Kelly Rowland  JJK:[id number] DOB: [birth year] DOA: 10/17/2022 PCP: Lake Bells, MD  214A/214A-AA  LOS: 7 days   Brief hospital course:   Assessment & Plan: Kelly Rowland is a 81 y.o. 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   Patient states she took [facility] powder last week for her back pain. She denies any vomiting or any bright red blood per rectum.    [name] -- baseline hemoglobin 15.0 in June 2022 --11/5--EGD - Bleeding friable duodenal mucosa. Injected. Treated with bipolar cautery. --continues to drop Hgb, s/p 4u pRBC total so far --repeat EGD on 11/9 found oozing of blood from a duodenal ulcer which was treated with epi injection and cautery.  Colonoscopy no acute finding. --Hgb has been stable for 2 days. Plan: --monitor for black or bloody stool --if Hgb continues to drop, GI recommended vascular surgery for embolization   Acute blood loss anemia --presumed due to GI bleed.  Anemia workup showed no def in iron, vit B12 or folate --s/p 4u pRBC total so far Plan: --transfuse to keep Hgb >7 --monitor Hgb for stability   acute hypoxic respiratory failure suspect acute pulmonary edema with volume overload CHF ruled out -- developed hypoxia on 11/5.  chest x-ray showed bilateral pulmonary edema.  Likely from fluid overload from IVF and blood transfusion -- IV Lasix 20 mg x2 --Echo showed normal systolic and diastolic function --O2 requirement went up to 4L this morning, CXR suggested interstitial edema Plan: --IV lasix 40 x1 today  Chest pain, ACS ruled out history of CAD status post stent --trop neg.  Cardiology consulted, ruled out ACS.  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  Baseline creatinine 1.4-- 1.7  PAD Carotid artery stenosis --hold home ASA --cont statin   History of hypertension --hold home coreg and amlodipine  DM2 with hypoglycemia and hyperglycemia  --cont gargine 10u daily --ACHS and SSI  Chronic back pain on chronic opioids --switch home percocet to oxycodone to avoid tylenol  Dysuria, resolved --resolved without abx.  Urine cx only 30,000 colonies.  Hold off treating for UTI.  Fever of unknown etiology --has intermittent fevers.  Blood cx neg so far.  Urine cx only 30,000 colonies.  COVID and RVP neg.  Hypophos --supplemented  Bilateral foot and ankle pain --this was present prior to arrival, and pt already had extensive outpatient workup without finding a cause for this pain. --cont gabapentin 100 mg TID (increased)   DVT prophylaxis: SCD/Compression stockings Code Status: Full code  Family Communication:  Level of care: Med-Surg Dispo:   The patient is from: home Anticipated d/c is to: SNF rehab Anticipated d/c date is: Monday   Subjective and Interval History:  Again, pt complained of leg pain.  No dysuria, no dyspnea.  However, RN noted later pt needed up to 4L O2 sating 92%.   Objective: Vitals:   10/24/22 1341 10/24/22 1342 10/24/22 1400 10/24/22 1508  BP: (!) 105/90     Pulse: 84     Resp:      Temp:      TempSrc:      SpO2: (!) 88% 91% 91% 93%  Weight:      Height:  Intake/Output Summary (Last 24 hours) at 10/24/2022 1535 Last data filed at 10/24/2022 1055 Gross per 24 hour  Intake 120 ml  Output 800 ml  Net -680 ml   Filed Weights   10/17/22 1333  Weight: 65.3 kg    Examination:   Constitutional: NAD, AAOx3 HEENT: conjunctivae and lids normal, EOMI CV: No cyanosis.   RESP: normal respiratory effort, on 2L Extremities: No effusions, edema in BLE, but painful with movement SKIN: warm, dry Neuro: II - XII grossly intact.   Psych: depressed  mood and affect.     Data Reviewed: I have personally reviewed labs and imaging studies  Time spent: 35 minutes  Enzo Bi, MD [facility] If 7PM-7AM, please contact night-coverage 10/24/2022, 3:35 PM

## 2022-10-24 NOTE — Consult Note (Signed)
PHARMACY CONSULT NOTE  Pharmacy Consult for Electrolyte Monitoring and Replacement   Recent Labs: Potassium (mmol/L)  Date Value  10/24/2022 3.9   Magnesium (mg/dL)  Date Value  29/93/7169 2.3   Calcium (mg/dL)  Date Value  67/89/3810 8.1 (L)   Albumin (g/dL)  Date Value  17/51/0258 3.2 (L)   Phosphorus (mg/dL)  Date Value  52/77/8242 2.9   Sodium (mmol/L)  Date Value  10/24/2022 137     Assessment: 81yo F w/ h/o CAD, CKD, COPD, DM, HTN, & HLD who presents with dark stool. At this time unable to use gut for repletion, will utilize IV instead. Pharmacy consulted for mgmt of electrolytes.   Goal of Therapy:  Electrolytes WNL  Plan:  No electrolyte replacement warranted for last 3 days Pharmacy will sign off and monitor electrolytes peripherally. Please re-consult if needed.  Bettey Costa, PharmD Clinical Pharmacist 10/24/2022 7:42 AM

## 2022-10-25 ENCOUNTER — Inpatient Hospital Stay: Payer: Medicare Other

## 2022-10-25 DIAGNOSIS — K922 Gastrointestinal hemorrhage, unspecified: Secondary | ICD-10-CM | POA: Diagnosis not present

## 2022-10-25 LAB — CBC
HCT: 24.6 % — ABNORMAL LOW (ref 36.0–46.0)
Hemoglobin: 7.9 g/dL — ABNORMAL LOW (ref 12.0–15.0)
MCH: 28.5 pg (ref 26.0–34.0)
MCHC: 32.1 g/dL (ref 30.0–36.0)
MCV: 88.8 fL (ref 80.0–100.0)
Platelets: 231 10*3/uL (ref 150–400)
RBC: 2.77 MIL/uL — ABNORMAL LOW (ref 3.87–5.11)
RDW: 15.6 % — ABNORMAL HIGH (ref 11.5–15.5)
WBC: 13.5 10*3/uL — ABNORMAL HIGH (ref 4.0–10.5)
nRBC: 0 % (ref 0.0–0.2)

## 2022-10-25 LAB — GLUCOSE, CAPILLARY
Glucose-Capillary: 110 mg/dL — ABNORMAL HIGH (ref 70–99)
Glucose-Capillary: 121 mg/dL — ABNORMAL HIGH (ref 70–99)
Glucose-Capillary: 128 mg/dL — ABNORMAL HIGH (ref 70–99)
Glucose-Capillary: 145 mg/dL — ABNORMAL HIGH (ref 70–99)
Glucose-Capillary: 97 mg/dL (ref 70–99)

## 2022-10-25 LAB — MAGNESIUM: Magnesium: 2.3 mg/dL (ref 1.7–2.4)

## 2022-10-25 LAB — BASIC METABOLIC PANEL
Anion gap: 10 (ref 5–15)
BUN: 31 mg/dL — ABNORMAL HIGH (ref 8–23)
CO2: 25 mmol/L (ref 22–32)
Calcium: 8.2 mg/dL — ABNORMAL LOW (ref 8.9–10.3)
Chloride: 104 mmol/L (ref 98–111)
Creatinine, Ser: 1.21 mg/dL — ABNORMAL HIGH (ref 0.44–1.00)
GFR, Estimated: 46 mL/min — ABNORMAL LOW (ref >60)
Glucose, Bld: 115 mg/dL — ABNORMAL HIGH (ref 70–99)
Potassium: 3.5 mmol/L (ref 3.5–5.1)
Sodium: 139 mmol/L (ref 135–145)

## 2022-10-25 LAB — PROCALCITONIN: Procalcitonin: 1.11 ng/mL

## 2022-10-25 MED ORDER — FUROSEMIDE 10 MG/ML IJ SOLN
40.0000 mg | Freq: Once | INTRAMUSCULAR | Status: AC
  Administered 2022-10-25: 40 mg via INTRAVENOUS
  Filled 2022-10-25: qty 4

## 2022-10-25 MED ORDER — GABAPENTIN 100 MG PO CAPS
200.0000 mg | ORAL_CAPSULE | Freq: Three times a day (TID) | ORAL | Status: DC
  Administered 2022-10-25 – 2022-11-04 (×27): 200 mg via ORAL
  Filled 2022-10-25 (×28): qty 2

## 2022-10-25 MED ORDER — ENOXAPARIN SODIUM 40 MG/0.4ML IJ SOSY
40.0000 mg | PREFILLED_SYRINGE | INTRAMUSCULAR | Status: DC
  Administered 2022-10-25 – 2022-10-27 (×3): 40 mg via SUBCUTANEOUS
  Filled 2022-10-25 (×3): qty 0.4

## 2022-10-25 MED ORDER — ENSURE ENLIVE PO LIQD
237.0000 mL | Freq: Three times a day (TID) | ORAL | Status: DC
  Administered 2022-10-25 – 2022-10-31 (×10): 237 mL via ORAL

## 2022-10-25 MED ORDER — LEVOFLOXACIN IN D5W 250 MG/50ML IV SOLN
250.0000 mg | INTRAVENOUS | Status: DC
  Administered 2022-10-25 – 2022-10-27 (×3): 250 mg via INTRAVENOUS
  Filled 2022-10-25 (×4): qty 50

## 2022-10-25 MED ORDER — METOLAZONE 5 MG PO TABS
5.0000 mg | ORAL_TABLET | Freq: Once | ORAL | Status: AC
  Administered 2022-10-25: 5 mg via ORAL
  Filled 2022-10-25: qty 1

## 2022-10-25 NOTE — Progress Notes (Signed)
PROGRESS NOTE    Kelly Rowland  [id number] DOB: Jun 07, 1941 DOA: 10/17/2022 PCP: Volanda Napoleon, MD  214A/214A-AA  LOS: 8 days   Brief hospital course:   Assessment & Plan: Kelly Rowland is a 81 y.o. 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   Patient states she took [facility] powder last week for her back pain. She denies any vomiting or any bright red blood per rectum.    [name] -- baseline hemoglobin 15.0 in June 2022 --11/5--EGD - Bleeding friable duodenal mucosa. Injected. Treated with bipolar cautery. --continues to drop Hgb, s/p 4u pRBC total so far --repeat EGD on 11/9 found oozing of blood from a duodenal ulcer which was treated with epi injection and cautery.  Colonoscopy no acute finding. --Hgb has been stable  Plan: --monitor for black or bloody stool --if Hgb continues to drop, GI recommended vascular surgery for embolization   Acute blood loss anemia --presumed due to GI bleed.  Anemia workup showed no def in iron, vit B12 or folate --s/p 4u pRBC total so far Plan: --transfuse to keep Hgb >7 --monitor Hgb for stability   acute hypoxic respiratory failure suspect acute pulmonary edema with volume overload Possible PNA -- developed hypoxia on 11/5.  chest x-ray showed bilateral pulmonary edema.  Likely from fluid overload from IVF and blood transfusion --O2 requirement went up to 4L morning of 11/11, CXR suggested interstitial edema or interstitial PNA.  Procal increased.   Plan: --IV lasix 40 x1 and metolazone today --start IV Levaquin   CHF ruled out --Echo showed normal systolic and diastolic function  Chest pain, ACS ruled out history of CAD status post stent --trop neg.  Cardiology consulted, ruled out  ACS.  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  Baseline creatinine 1.4-- 1.7  PAD Carotid artery stenosis --hold home ASA --cont statin   History of hypertension --hold home coreg and amlodipine  DM2 with hypoglycemia and hyperglycemia  --cont gargine 10u daily --ACHS and SSI  Chronic back pain on chronic opioids --switch home percocet to oxycodone to avoid tylenol  Dysuria, resolved --resolved without abx.  Urine cx only 30,000 colonies.  Hold off treating for UTI.  Fever of unknown etiology --has intermittent fevers.  Blood cx neg so far.  Urine cx only 30,000 colonies.  COVID and RVP neg.  Hypophos --supplemented  Bilateral foot and ankle pain, intermittent --this was present prior to arrival, and pt already had extensive outpatient workup without finding a cause for this pain.  Had MRI left ankle on 08/31/22, however, currently complained of severe right ankle pain. --pt was able to walk with PT on 10/18/22, but since around 10/22/22, pt has been complaining of severe bilateral foot pain and inability to tolerate any dorsi-flexion, let along bear weight on her feet.  --increase gabapentin to 200 mg TID  Weakness in right hand --first reported today.  Pt said it's been present for about 2 days, and she thinks it's a stroke.  --CT head   DVT prophylaxis: Lovenox SQ Code Status: Full code  Family Communication: son updated at bedside today Level of care: Med-Surg Dispo:   The patient is from: home Anticipated d/c is to: SNF rehab Anticipated d/c date is: Monday   Subjective and Interval  History:  Pt told me today that she thought she had a stroke because her right hand has been weak for the past 2 days.  Also complained of pain in her right jaw which she said prevented her from eating.  Continued to complained of severe BLE pain, but difficult to locate exact points of pain, cried out when barely moving her  ankle.   Objective: Vitals:   10/25/22 0532 10/25/22 0733 10/25/22 0809 10/25/22 1638  BP: (!) 156/56 (!) 160/59  (!) 147/53  Pulse: 83 85  81  Resp: 20 18  18   Temp: 99.8 F (37.7 C) 99.1 F (37.3 C)  98.6 F (37 C)  TempSrc: Oral Oral  Oral  SpO2: 97% 94% 94% 96%  Weight:      Height:        Intake/Output Summary (Last 24 hours) at 10/25/2022 1657 Last data filed at 10/25/2022 1401 Gross per 24 hour  Intake 240 ml  Output 400 ml  Net -160 ml   Filed Weights   10/17/22 1333  Weight: 65.3 kg    Examination:   Constitutional: NAD, AAOx3 HEENT: conjunctivae and lids normal, EOMI CV: No cyanosis.   RESP: normal respiratory effort, on 2L Extremities: No effusions, edema in BLE.   SKIN: warm, dry Neuro: II - XII grossly intact.  Decreased grip strength in right hand. Psych: depressed mood and affect.     Data Reviewed: I have personally reviewed labs and imaging studies  Time spent: 50 minutes  13/04/23, MD [facility] If 7PM-7AM, please contact night-coverage 10/25/2022, 4:57 PM

## 2022-10-26 ENCOUNTER — Inpatient Hospital Stay: Payer: Medicare Other

## 2022-10-26 DIAGNOSIS — I639 Cerebral infarction, unspecified: Secondary | ICD-10-CM | POA: Diagnosis not present

## 2022-10-26 DIAGNOSIS — K922 Gastrointestinal hemorrhage, unspecified: Secondary | ICD-10-CM | POA: Diagnosis not present

## 2022-10-26 LAB — GLUCOSE, CAPILLARY
Glucose-Capillary: 117 mg/dL — ABNORMAL HIGH (ref 70–99)
Glucose-Capillary: 103 mg/dL — ABNORMAL HIGH (ref 70–99)
Glucose-Capillary: 58 mg/dL — ABNORMAL LOW (ref 70–99)
Glucose-Capillary: 128 mg/dL — ABNORMAL HIGH (ref 70–99)
Glucose-Capillary: 64 mg/dL — ABNORMAL LOW (ref 70–99)
Glucose-Capillary: 121 mg/dL — ABNORMAL HIGH (ref 70–99)

## 2022-10-26 LAB — LIPID PANEL
Cholesterol: 73 mg/dL (ref 0–200)
HDL: 10 mg/dL — ABNORMAL LOW (ref >40)
LDL Cholesterol: 39 mg/dL (ref 0–99)
Total CHOL/HDL Ratio: 7.3 RATIO
Triglycerides: 122 mg/dL (ref <150)
VLDL: 24 mg/dL (ref 0–40)

## 2022-10-26 LAB — BASIC METABOLIC PANEL
Anion gap: 10 (ref 5–15)
BUN: 40 mg/dL — ABNORMAL HIGH (ref 8–23)
CO2: 29 mmol/L (ref 22–32)
Calcium: 8.2 mg/dL — ABNORMAL LOW (ref 8.9–10.3)
Chloride: 96 mmol/L — ABNORMAL LOW (ref 98–111)
Creatinine, Ser: 1.14 mg/dL — ABNORMAL HIGH (ref 0.44–1.00)
GFR, Estimated: 49 mL/min — ABNORMAL LOW (ref >60)
Glucose, Bld: 111 mg/dL — ABNORMAL HIGH (ref 70–99)
Potassium: 2.6 mmol/L — CL (ref 3.5–5.1)
Sodium: 135 mmol/L (ref 135–145)

## 2022-10-26 LAB — CULTURE, BLOOD (ROUTINE X 2)
Culture: NO GROWTH
Culture: NO GROWTH
Special Requests: ADEQUATE
Special Requests: ADEQUATE

## 2022-10-26 LAB — CBC
HCT: 25.2 % — ABNORMAL LOW (ref 36.0–46.0)
Hemoglobin: 8.3 g/dL — ABNORMAL LOW (ref 12.0–15.0)
MCH: 28.8 pg (ref 26.0–34.0)
MCHC: 32.9 g/dL (ref 30.0–36.0)
MCV: 87.5 fL (ref 80.0–100.0)
Platelets: 278 10*3/uL (ref 150–400)
RBC: 2.88 MIL/uL — ABNORMAL LOW (ref 3.87–5.11)
RDW: 15.5 % (ref 11.5–15.5)
WBC: 16.8 10*3/uL — ABNORMAL HIGH (ref 4.0–10.5)
nRBC: 0 % (ref 0.0–0.2)

## 2022-10-26 LAB — POTASSIUM: Potassium: 3.3 mmol/L — ABNORMAL LOW (ref 3.5–5.1)

## 2022-10-26 LAB — MAGNESIUM: Magnesium: 2 mg/dL (ref 1.7–2.4)

## 2022-10-26 MED ORDER — POTASSIUM CHLORIDE CRYS ER 20 MEQ PO TBCR
40.0000 meq | EXTENDED_RELEASE_TABLET | ORAL | Status: AC
  Administered 2022-10-26: 40 meq via ORAL
  Filled 2022-10-26 (×2): qty 2

## 2022-10-26 MED ORDER — POTASSIUM CHLORIDE CRYS ER 20 MEQ PO TBCR
40.0000 meq | EXTENDED_RELEASE_TABLET | Freq: Once | ORAL | Status: AC
  Administered 2022-10-26: 40 meq via ORAL
  Filled 2022-10-26: qty 2

## 2022-10-26 MED ORDER — POTASSIUM CHLORIDE 10 MEQ/100ML IV SOLN: 10.0000 meq | INTRAVENOUS | Status: DC

## 2022-10-26 MED ORDER — LORAZEPAM 2 MG/ML IJ SOLN
2.0000 mg | Freq: Once | INTRAMUSCULAR | Status: AC | PRN
  Administered 2022-10-26: 2 mg via INTRAVENOUS
  Filled 2022-10-26: qty 1

## 2022-10-26 MED ORDER — DEXTROSE 50 % IV SOLN
12.5000 g | INTRAVENOUS | Status: AC
  Administered 2022-10-26: 12.5 g via INTRAVENOUS

## 2022-10-26 MED ORDER — PANTOPRAZOLE SODIUM 40 MG PO TBEC
40.0000 mg | DELAYED_RELEASE_TABLET | Freq: Two times a day (BID) | ORAL | Status: DC
  Administered 2022-10-26 – 2022-11-04 (×18): 40 mg via ORAL
  Filled 2022-10-26 (×18): qty 1

## 2022-10-26 MED ORDER — METOLAZONE 5 MG PO TABS
5.0000 mg | ORAL_TABLET | Freq: Once | ORAL | Status: AC
  Administered 2022-10-26: 5 mg via ORAL
  Filled 2022-10-26: qty 1

## 2022-10-26 MED ORDER — DEXTROSE 50 % IV SOLN
INTRAVENOUS | Status: AC
  Filled 2022-10-26: qty 50

## 2022-10-26 MED ORDER — FUROSEMIDE 10 MG/ML IJ SOLN
40.0000 mg | Freq: Once | INTRAMUSCULAR | Status: AC
  Administered 2022-10-26: 40 mg via INTRAVENOUS
  Filled 2022-10-26: qty 4

## 2022-10-26 MED ORDER — POTASSIUM CHLORIDE 10 MEQ/100ML IV SOLN
10.0000 meq | INTRAVENOUS | Status: DC
  Administered 2022-10-26: 10 meq via INTRAVENOUS
  Filled 2022-10-26: qty 100

## 2022-10-26 NOTE — Progress Notes (Signed)
PROGRESS NOTE    Kelly Rowland  QPR:[id number] DOB: Jan 05, 1941 DOA: 10/17/2022 PCP: Lake Bells, MD  214A/214A-AA  LOS: 9 days   Brief hospital course:   Assessment & Plan: Kelly Rowland is a 81 y.o. 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   Patient states she took [facility] powder last week for her back pain. She denies any vomiting or any bright red blood per rectum.    [name] -- baseline hemoglobin 15.0 in June 2022 --11/5--EGD - Bleeding friable duodenal mucosa. Injected. Treated with bipolar cautery. --continued to drop Hgb, s/p 4u pRBC total so far --repeat EGD on 11/9 found oozing of blood from a duodenal ulcer which was treated with epi injection and cautery.  Colonoscopy no acute finding. --Hgb has been stable  --cont PPI BID  Acute blood loss anemia --presumed due to GI bleed.  Anemia workup showed no def in iron, vit B12 or folate --s/p 4u pRBC total so far Plan: --transfuse to keep Hgb >7 --monitor Hgb for stability   acute hypoxic respiratory failure suspect acute pulmonary edema with volume overload Possible PNA -- developed hypoxia on 11/5.  chest x-ray showed bilateral pulmonary edema.  Likely from fluid overload from IVF and blood transfusion --O2 requirement went up to 4L morning of 11/11, CXR suggested interstitial edema or interstitial PNA.  Procal increased.  Started on IV levaquin. Plan: --IV lasix 40 x1 and metolazone today --cont IV Levaquin  --Continue supplemental O2 to keep sats >=90%, wean as tolerated  CHF ruled out --Echo showed normal systolic and diastolic function  Chest pain, ACS ruled out history of CAD status post stent --trop neg.  Cardiology consulted, ruled out  ACS.  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  Baseline creatinine 1.4-- 1.7  PAD Carotid artery stenosis --hold home ASA --cont statin   History of hypertension --hold home coreg and amlodipine  DM2 with hypoglycemia and hyperglycemia  --cont gargine 10u daily --ACHS and SSI  Chronic back pain on chronic opioids --switch home percocet to oxycodone to avoid tylenol  Dysuria, resolved --resolved without abx.  Urine cx only 30,000 colonies.  Hold off treating for UTI.  Fever of unknown etiology, resolved --has intermittent fevers.  Blood cx neg so far.  Urine cx only 30,000 colonies.  COVID and RVP neg.  Hypophos --supplemented  Bilateral foot and ankle pain, intermittent --this was present prior to arrival, and pt already had extensive outpatient workup without finding a cause for this pain.  Had MRI left ankle on 08/31/22, however, currently complained of severe right ankle pain. --pt was able to walk with PT on 10/18/22, but since around 10/22/22, pt has been complaining of severe bilateral foot pain and inability to tolerate any dorsi-flexion, let along bear weight on her feet.  --cont gabapentin 200 mg TID  Acute/subacute stroke --pt first reported weakness in right hand on 11/12, symptom had been present for about 2 days. --MRI brain today showed Acute/subacute infarcts in the left centrum semiovale, corona radiata and basal ganglia region in a watershed type distribution. --Neuro consult today   DVT prophylaxis: Lovenox SQ Code Status: Full code  Family Communication:  Level of care: Med-Surg Dispo:   The patient is from: home Anticipated d/c is to: SNF  rehab Anticipated d/c date is: 1-2 days for medical readiness   Subjective and Interval History:  CT head yesterday did show stroke.  MRI brain today showed Acute/subacute infarcts.  Neuro consulted.   Objective: Vitals:   10/26/22 0331 10/26/22 0725  10/26/22 0751 10/26/22 1536  BP: (!) 160/54 (!) 144/56 (!) 150/50 (!) 140/56  Pulse: 85 88 86 90  Resp: 18 (!) 22 18 16   Temp:  99.4 F (37.4 C) 99.3 F (37.4 C) 99.5 F (37.5 C)  TempSrc:  Oral Oral Oral  SpO2: 95% 96% 95% 94%  Weight:      Height:        Intake/Output Summary (Last 24 hours) at 10/26/2022 1747 Last data filed at 10/26/2022 1700 Gross per 24 hour  Intake 837 ml  Output 1600 ml  Net -763 ml   Filed Weights   10/17/22 1333  Weight: 65.3 kg    Examination:   Constitutional: NAD, AAOx3 HEENT: conjunctivae and lids normal, EOMI CV: No cyanosis.   RESP: normal respiratory effort Extremities: pain with minimum movement of both ankles SKIN: warm, dry Neuro: II - XII grossly intact.   Psych: depressed mood and affect.     Data Reviewed: I have personally reviewed labs and imaging studies  Time spent: 50 minutes  13/04/23, MD [facility] If 7PM-7AM, please contact night-coverage 10/26/2022, 5:47 PM

## 2022-10-26 NOTE — Care Management Important Message (Signed)
Important Message  Patient Details  Name: Kelly Rowland MRN: [id number] Date of Birth: 05/17/1941   Medicare Important Message Given:  Yes     [name] 10/26/2022, 12:02 PM

## 2022-10-26 NOTE — Anesthesia Postprocedure Evaluation (Signed)
Anesthesia Post Note  Patient: Yudit Modesitt  Procedure(s) Performed: ESOPHAGOGASTRODUODENOSCOPY (EGD) WITH PROPOFOL COLONOSCOPY  Patient location during evaluation: PACU Anesthesia Type: General Level of consciousness: awake and alert Pain management: pain level controlled Vital Signs Assessment: post-procedure vital signs reviewed and stable Respiratory status: spontaneous breathing, nonlabored ventilation and respiratory function stable Cardiovascular status: blood pressure returned to baseline and stable Postop Assessment: no apparent nausea or vomiting Anesthetic complications: no   No notable events documented.   Last Vitals:  Vitals:   10/26/22 1536 10/26/22 1700  BP: (!) 140/56   Pulse: 90   Resp: 16   Temp: 37.5 C 37.7 C  SpO2: 94%     Last Pain:  Vitals:   10/26/22 1700  TempSrc: Oral  PainSc:                  Foye Deer

## 2022-10-26 NOTE — Progress Notes (Signed)
Informed Bishop Limbo of critical Potassium level 2.6

## 2022-10-26 NOTE — Plan of Care (Signed)
  Problem: Nutrition: Goal: Adequate nutrition will be maintained Outcome: Not Progressing   Problem: Coping: Goal: Level of anxiety will decrease Outcome: Not Progressing   Problem: Pain Managment: Goal: General experience of comfort will improve Outcome: Not Progressing   Problem: Safety: Goal: Ability to remain free from injury will improve Outcome: Progressing   Problem: Skin Integrity: Goal: Risk for impaired skin integrity will decrease Outcome: Progressing

## 2022-10-26 NOTE — Progress Notes (Signed)
Hypoglycemic Event  CBG: 58  Treatment: D50 25 mL (12.5 gm)  Symptoms: None  Follow-up CBG: Time:1728 CBG Result:128  Possible Reasons for Event: Inadequate meal intake  Comments/MD notified: N/A    Madie Reno, RN

## 2022-10-26 NOTE — Progress Notes (Signed)
       CROSS COVER NOTE  NAME: Kelly Rowland MRN: [id number] DOB : May 05, 1941 ATTENDING PHYSICIAN: Darlin Priestly, MD    Date of Service   10/26/2022   HPI/Events of Note   Notified of Critical K--> 2.6  Interventions   Assessment/Plan: 40 mEQ PO K 40 mEQ IV K Repeat K at 1400      This document was prepared using Dragon voice recognition software and may include unintentional dictation errors.  Bishop Limbo DNP, MBA, FNP-BC Nurse Practitioner [facility] Pager [phone]

## 2022-10-26 NOTE — Consult Note (Signed)
NEURO HOSPITALIST CONSULT NOTE   Requestig physician: Dr. Fran Lowes  Reason for Consult: New onset of right hand weakness  History obtained from:   Chart     HPI:                                                                                                                                          Kelly Rowland is a 81 y.o. female with a PMHx of CAD, CKD, arthritis, COPD, DM, HLD, HTN and RLS who was admitted on 11/4 for management of GI bleed, which has resolved, now waiting on SNF rehab.  Yesterday, for the first time, patient told staff that she thought she had a stroke because her right hand had been weak for the past couple of days. CT head showed a small age indeterminate infarct in the anterior limb the left internal capsule/caudate, potentially acute. Hospitalist service ordered an MRI brain, which patient then refused to undergo without anesthesia. Neurology was consulted to further assess.    Past Medical History:  Diagnosis Date   Arthritis    CAD (coronary artery disease)    Chronic kidney disease    COPD (chronic obstructive pulmonary disease) (HCC)    Diabetes mellitus without complication (HCC)    Hyperlipidemia    Hypertension    Restless legs syndrome (RLS)     Past Surgical History:  Procedure Laterality Date   ABDOMINAL HYSTERECTOMY     APPENDECTOMY     CATARACT EXTRACTION Bilateral 2019   COLONOSCOPY N/A 10/22/2022   Procedure: COLONOSCOPY;  Surgeon: Midge Minium, MD;  Location: [facility] ENDOSCOPY;  Service: Endoscopy;  Laterality: N/A;   ESOPHAGOGASTRODUODENOSCOPY (EGD) WITH PROPOFOL N/A 10/18/2022   Procedure: ESOPHAGOGASTRODUODENOSCOPY (EGD) WITH PROPOFOL;  Surgeon: Toney Reil, MD;  Location: [facility] ENDOSCOPY;  Service: Gastroenterology;  Laterality: N/A;   ESOPHAGOGASTRODUODENOSCOPY (EGD) WITH PROPOFOL N/A 10/22/2022   Procedure: ESOPHAGOGASTRODUODENOSCOPY (EGD) WITH PROPOFOL;  Surgeon: Midge Minium, MD;  Location: [facility] ENDOSCOPY;  Service:  Endoscopy;  Laterality: N/A;   TONSILLECTOMY      Family History  Problem Relation Age of Onset   Hypertension Mother    Heart disease Mother    Diabetes Mother    Hypertension Father    Heart disease Father               Social History:  reports that she quit smoking about 5 years ago. Her smoking use included cigarettes. She has a 50.00 pack-year smoking history. She has never used smokeless tobacco. She reports that she does not drink alcohol and does not use drugs.  Allergies  Allergen Reactions   Amoxicillin Anaphylaxis   Lisinopril Anaphylaxis   Tramadol Nausea Only   Gabapentin Other (See Comments)    Hands jerking   Colchicine Nausea Only  MEDICATIONS:                                                                                                                     Scheduled:  sodium chloride   Intravenous Once   cholecalciferol  2,000 Units Oral Daily   cyanocobalamin  500 mcg Oral Daily   dextrose       enoxaparin (LOVENOX) injection  40 mg Subcutaneous Q24H   feeding supplement  237 mL Oral TID BM   folic acid  1,000 mcg Oral Daily   gabapentin  200 mg Oral TID   glimepiride  4 mg Oral Daily   insulin aspart  0-15 Units Subcutaneous TID WC   insulin glargine-yfgn  10 Units Subcutaneous Daily   metolazone  5 mg Oral Once   mometasone-formoterol  2 puff Inhalation BID   pantoprazole  40 mg Oral BID   senna  1 tablet Oral BID   simvastatin  20 mg Oral QHS   Continuous:  levofloxacin (LEVAQUIN) IV Stopped (10/26/22 1315)    ROS:                                                                                                                                       Unable to obtain a comprehensive ROS due to drowsiness.    Blood pressure (!) 150/50, pulse 86, temperature 99.3 F (37.4 C), temperature source Oral, resp. rate 18, height 5\' 2"  (1.575 m), weight 65.3 kg, SpO2 95 %.   General Examination:                                                                                                        Physical Exam  HEENT-  Imogene/AT  Lungs- Respirations unlabored Extremities- Warm and well perfused   Neurological Examination Mental Status: Drowsy. Bradyphrenic with limited, slow speech output. Speech is mildly dysarthric. Able to name a thumb, pinky and forefinger. Poorly compliant with other questions. Has difficulty following some commands in the context  of poor compliance.  Cranial Nerves: II: Grossly intact temporal visual fields on the left and right. Positive for extinction on the left to DSS. PERRL.   III,IV, VI: Tracks to the right and left with conjugate EOM. Saccadic quality of visual pursuits is noted.  V: Subjectively normal bilaterally VII: Smile is weak but symmetric.  VIII: Hearing intact to voice IX,X: No hypophonia or hoarseness XI: Head is midline XII: Midline tongue extension Motor: RUE 3/5 forearm antigravity movement, 2/5 deltoid. Does not grip examiner's hand when asked.  RLE with trace movement of foot to command.  LUE 4/5 LLE with slight flexion at knee to command, otherwise no movement Sensory: Reacts to touch LUE. No definite movement to touch RUE in the context of fatigue and poor compliance.  Left foot with allodynia to light tactile stimulus Right foot with no response to light tactile stimulus Deep Tendon Reflexes: 1+ bilateral brachioradialis. Deferred patellars and achilles due to lower extremity pain.  Cerebellar: Not compliant Gait: Unable to assess   Lab Results: Basic Metabolic Panel: Recent Labs  Lab 10/20/22 0558 10/21/22 0646 10/21/22 0819 10/22/22 0339 10/23/22 0512 10/24/22 0528 10/25/22 0521 10/26/22 0459  NA 135 137   < > 141 138 137 139 135  K 3.8 3.4*   < > 4.0 3.9 3.9 3.5 2.6*  CL 103 103   < > 107 107 105 104 96*  CO2 25 28   < > 25 24 26 25 29   GLUCOSE 233* 195*   < > 179* 154* 119* 115* 111*  BUN 38* 50*   < > 37* 28* 30* 31* 40*  CREATININE 1.30* 1.35*   < > 1.26* 1.09*  1.09* 1.21* 1.14*  CALCIUM 8.0* 7.8*   < > 8.1* 8.1* 8.1* 8.2* 8.2*  MG 2.2  --   --  2.2 2.3 2.3 2.3 2.0  PHOS 2.1* 2.9  --   --   --   --   --   --    < > = values in this interval not displayed.    CBC: Recent Labs  Lab 10/22/22 0339 10/22/22 1308 10/23/22 0512 10/24/22 0528 10/25/22 0521 10/26/22 0459  WBC 13.2*  --  12.2* 12.6* 13.5* 16.8*  HGB 6.5* 8.9* 7.6* 7.9* 7.9* 8.3*  HCT 20.2* 26.9* 23.5* 24.4* 24.6* 25.2*  MCV 89.8  --  89.7 90.4 88.8 87.5  PLT 165  --  201 213 231 278    Cardiac Enzymes: No results for input(s): "CKTOTAL", "CKMB", "CKMBINDEX", "TROPONINI" in the last 168 hours.  Lipid Panel: No results for input(s): "CHOL", "TRIG", "HDL", "CHOLHDL", "VLDL", "LDLCALC" in the last 168 hours.  Imaging: CT HEAD WO CONTRAST (5MM)  Result Date: 10/25/2022 CLINICAL DATA:  right hand weakness EXAM: CT HEAD WITHOUT CONTRAST TECHNIQUE: Contiguous axial images were obtained from the base of the skull through the vertex without intravenous contrast. RADIATION DOSE REDUCTION: This exam was performed according to the departmental dose-optimization program which includes automated exposure control, adjustment of the mA and/or kV according to patient size and/or use of iterative reconstruction technique. COMPARISON:  None Available. FINDINGS: Brain: Small age indeterminate infarct in the anterior limb the left internal capsule/caudate. No acute hemorrhage, mass lesion, midline shift or hydrocephalus. Patchy white matter hypodensities, nonspecific but compatible with chronic microvascular ischemic disease. Vascular: No hyperdense vessel identified. Calcific atherosclerosis. Skull: No acute fracture. Sinuses/Orbits: Clear sinuses.  Knee surgery findings. IMPRESSION: 1. Small age indeterminate infarct in the anterior limb the left internal capsule/caudate,  potentially acute. Recommend MRI to further evaluate. 2. Chronic microvascular ischemic disease. These results will be called to the  ordering clinician or representative by the Radiologist Assistant, and communication documented in the PACS or [employer]. Electronically Signed   By: Feliberto Harts M.D.   On: 10/25/2022 18:02   DG Chest Port 1 View  Result Date: 10/24/2022 CLINICAL DATA:  Hypoxia EXAM: PORTABLE CHEST 1 VIEW COMPARISON:  10/19/2022 FINDINGS: Transverse diameter of heart is increased. Thoracic aorta is tortuous and ectatic. There is improvement in aeration in right upper lung field. There is residual prominence of interstitial markings in left parahilar region and both lower lung fields. No new focal pulmonary consolidation is seen. Lateral CP angles are indistinct suggesting small effusions. There is no pneumothorax. IMPRESSION: Cardiomegaly. There is slight prominence of interstitial markings in left parahilar region and both lower lung fields suggesting interstitial edema or interstitial pneumonia. Possible small bilateral pleural effusions. Electronically Signed   By: Ernie Avena M.D.   On: 10/24/2022 14:28     Assessment: 81 y.o. female with a PMHx of CAD, CKD, arthritis, COPD, DM, HLD, HTN and RLS who was admitted on 11/4 for management of GI bleed, which has resolved, now waiting on SNF rehab.  Yesterday, for the first time, patient told staff that she thought she had a stroke because her right hand had been weak for the past couple of days. CT head showed a small age indeterminate infarct in the anterior limb the left internal capsule/caudate, potentially acute. Hospitalist service ordered an MRI brain, which patient then refused to undergo without anesthesia. Neurology was consulted to further assess.   - Exam reveals findings suggestive of a stroke localizing to the left hemisphere. Poor cooperation diminishes further localizing value of the exam.  - CT head: Small age indeterminate infarct in the anterior limb the left internal capsule/caudate, potentially acute. Chronic microvascular ischemic  disease. - MRI brain:  Acute/subacute infarcts in the left centrum semiovale, corona radiata and basal ganglia region in a watershed type distribution. Moderate chronic microvascular ischemic changes of the white matter. Motion degraded MR angiogram of the head without evidence of hemodynamically significant stenosis. - Recent TTE from 11/6: LVEF 55 to 60% with normal LV function and no regional wall motion abnormalities, but with mild left ventricular hypertrophy. Left atrial size mildly dilated. The aortic valve is normal in structure. Aortic valve regurgitation is not visualized. Aortic valve sclerosis is present, with no evidence of aortic valve stenosis. No mural thrombus mentioned in the report.   - K low at 2.6, which could be contributing to the patient's weakness.  - Elevated BUN and Cr suggestive of combined volume depletion and AKI, which may be contributing to her fatiguability on exam as well.   Recommendations: - Carotid ultrasound to assess for possible high grade left ICA stenosis given the left sided watershed infarctions (ordered).  - Cardiac telemetry - Will need to be started on ASA 81 mg po qd for secondary stroke prevention once safe to do so in the context of recent GIB.  - BP management. Given the watershed distribution of her strokes, must avoid hypotension. Would maintain a SBP goal of 130-150 for now.  - PT/OT/Speech - Frequent neuro checks - NPO until passes stroke swallow screen   Electronically signed: Dr. Caryl Pina 10/26/2022, 10:12 AM

## 2022-10-27 ENCOUNTER — Inpatient Hospital Stay: Payer: Medicare Other

## 2022-10-27 DIAGNOSIS — K922 Gastrointestinal hemorrhage, unspecified: Secondary | ICD-10-CM | POA: Diagnosis not present

## 2022-10-27 LAB — GLUCOSE, CAPILLARY
Glucose-Capillary: 67 mg/dL — ABNORMAL LOW (ref 70–99)
Glucose-Capillary: 73 mg/dL (ref 70–99)
Glucose-Capillary: 79 mg/dL (ref 70–99)
Glucose-Capillary: 76 mg/dL (ref 70–99)
Glucose-Capillary: 231 mg/dL — ABNORMAL HIGH (ref 70–99)

## 2022-10-27 LAB — BASIC METABOLIC PANEL
Anion gap: 11 (ref 5–15)
BUN: 37 mg/dL — ABNORMAL HIGH (ref 8–23)
CO2: 32 mmol/L (ref 22–32)
Calcium: 8.1 mg/dL — ABNORMAL LOW (ref 8.9–10.3)
Chloride: 95 mmol/L — ABNORMAL LOW (ref 98–111)
Creatinine, Ser: 1.15 mg/dL — ABNORMAL HIGH (ref 0.44–1.00)
GFR, Estimated: 48 mL/min — ABNORMAL LOW (ref >60)
Glucose, Bld: 71 mg/dL (ref 70–99)
Potassium: 3.2 mmol/L — ABNORMAL LOW (ref 3.5–5.1)
Sodium: 138 mmol/L (ref 135–145)

## 2022-10-27 LAB — C-REACTIVE PROTEIN: CRP: 27.4 mg/dL — ABNORMAL HIGH (ref <1.0)

## 2022-10-27 LAB — CBC
HCT: 25.4 % — ABNORMAL LOW (ref 36.0–46.0)
Hemoglobin: 8.1 g/dL — ABNORMAL LOW (ref 12.0–15.0)
MCH: 28.4 pg (ref 26.0–34.0)
MCHC: 31.9 g/dL (ref 30.0–36.0)
MCV: 89.1 fL (ref 80.0–100.0)
Platelets: 325 10*3/uL (ref 150–400)
RBC: 2.85 MIL/uL — ABNORMAL LOW (ref 3.87–5.11)
RDW: 15.8 % — ABNORMAL HIGH (ref 11.5–15.5)
WBC: 19.1 10*3/uL — ABNORMAL HIGH (ref 4.0–10.5)
nRBC: 0 % (ref 0.0–0.2)

## 2022-10-27 LAB — URIC ACID: Uric Acid, Serum: 9.8 mg/dL — ABNORMAL HIGH (ref 2.5–7.1)

## 2022-10-27 LAB — SEDIMENTATION RATE: Sed Rate: 128 mm/hr — ABNORMAL HIGH (ref 0–30)

## 2022-10-27 LAB — MAGNESIUM: Magnesium: 2.2 mg/dL (ref 1.7–2.4)

## 2022-10-27 LAB — PROCALCITONIN: Procalcitonin: 2.03 ng/mL

## 2022-10-27 MED ORDER — POTASSIUM CHLORIDE CRYS ER 20 MEQ PO TBCR
40.0000 meq | EXTENDED_RELEASE_TABLET | Freq: Once | ORAL | Status: AC
  Administered 2022-10-27: 40 meq via ORAL
  Filled 2022-10-27: qty 2

## 2022-10-27 MED ORDER — METOLAZONE 5 MG PO TABS
5.0000 mg | ORAL_TABLET | Freq: Once | ORAL | Status: AC
  Administered 2022-10-27: 5 mg via ORAL
  Filled 2022-10-27: qty 1

## 2022-10-27 MED ORDER — METOLAZONE 5 MG PO TABS
5.0000 mg | ORAL_TABLET | Freq: Every day | ORAL | Status: DC
  Filled 2022-10-27: qty 1

## 2022-10-27 MED ORDER — FUROSEMIDE 10 MG/ML IJ SOLN: 40.0000 mg | Freq: Every day | INTRAMUSCULAR | Status: DC

## 2022-10-27 MED ORDER — FUROSEMIDE 10 MG/ML IJ SOLN
40.0000 mg | Freq: Once | INTRAMUSCULAR | Status: AC
  Administered 2022-10-27: 40 mg via INTRAVENOUS
  Filled 2022-10-27: qty 4

## 2022-10-27 MED ORDER — ASPIRIN 81 MG PO TBEC
81.0000 mg | DELAYED_RELEASE_TABLET | Freq: Every day | ORAL | Status: DC
  Administered 2022-10-27 – 2022-11-04 (×9): 81 mg via ORAL
  Filled 2022-10-27 (×9): qty 1

## 2022-10-27 MED ORDER — PREDNISONE 20 MG PO TABS
40.0000 mg | ORAL_TABLET | Freq: Every day | ORAL | Status: DC
  Administered 2022-10-27 – 2022-10-28 (×2): 40 mg via ORAL
  Filled 2022-10-27 (×2): qty 2

## 2022-10-27 NOTE — Inpatient Diabetes Management (Signed)
Inpatient Diabetes Program Recommendations  AACE/ADA: New Consensus Statement on Inpatient Glycemic Control (2015)  Target Ranges:  Prepandial:   less than 140 mg/dL      Peak postprandial:   less than 180 mg/dL (1-2 hours)      Critically ill patients:  140 - 180 mg/dL    Latest Reference Range & Units 10/26/22 07:50 10/26/22 11:55 10/26/22 17:08 10/26/22 17:43 10/26/22 21:03 10/26/22 22:31  Glucose-Capillary 70 - 99 mg/dL 696 (H)  4 mg Amaryl @0905   10 units Semglee @0909  103 (H) 58 (L) 128 (H) 64 (L) 121 (H)  (H): Data is abnormally high (L): Data is abnormally low    Current Orders: Semglee 10 units daily  Novolog 0-15 units TID  Amaryl 4 mg Daily     MD- Note Hypoglycemia yesterday ay 5pm and again at 9pm  Doesn't appear that pt is eating well  Please consider discontinuing the Amaryl 4 mg daily    --Will follow patient during hospitalization--  RN, MSN, CDCES Diabetes Coordinator Inpatient Glycemic Control Team Team Pager: [phone] (8a-5p)

## 2022-10-27 NOTE — Plan of Care (Signed)

## 2022-10-27 NOTE — Progress Notes (Signed)
PROGRESS NOTE    Kelly Rowland  ZDG:[id number] DOB: 22-Aug-1941 DOA: 10/17/2022 PCP: Volanda Napoleon, MD  214A/214A-AA  LOS: 10 days   Brief hospital course:   Assessment & Plan: Kelly Rowland is a 81 y.o. 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   Patient states she took [facility] powder last week for her back pain. She denies any vomiting or any bright red blood per rectum.    [name], resolved -- baseline hemoglobin 15.0 in June 2022 --11/5--EGD - Bleeding friable duodenal mucosa. Injected. Treated with bipolar cautery. --continued to drop Hgb, s/p 4u pRBC total so far --repeat EGD on 11/9 found oozing of blood from a duodenal ulcer which was treated with epi injection and cautery.  Colonoscopy no acute finding. --Hgb has been stable and no more melena since 2nd EGD. --cont PPI BID  Acute blood loss anemia --presumed due to GI bleed.  Anemia workup showed no def in iron, vit B12 or folate --s/p 4u pRBC total so far --Hgb has been stable and slowly improving from 7's-8's since 2nd EGD treatment. Plan: --transfuse to keep Hgb >7  acute hypoxic respiratory failure suspect acute pulmonary edema with volume overload Possible PNA -- developed hypoxia on 11/5.  chest x-ray showed bilateral pulmonary edema.  Likely from fluid overload from IVF and blood transfusion --O2 requirement went up to 4L morning of 11/11, CXR suggested interstitial edema or interstitial PNA.  Procal increased.  Started on IV levaquin. Plan: --IV lasix 40 x1 and metolazone today --cont Levaquin for a 5-day course --Continue supplemental O2 to keep sats >=90%, wean as tolerated  Acute stroke, not POA --pt first reported weakness in right hand on 11/12, symptom had  been present for about 2 days. --MRI brain 11/13 showed Acute/subacute infarcts in the left centrum semiovale, corona radiata and basal ganglia region in a watershed type distribution. --Neuro consulted Plan: --US Carotid --cont tele --resume home ASA 81 --Given the watershed distribution of her strokes, must avoid hypotension. Would maintain a SBP goal of 130-150 for now.   Bilateral foot and ankle pain Possible acute gout --this was present prior to arrival, intermittent with varying severity, and pt already had extensive outpatient workup with suggestions of gout, swelling or nerve as etiology of the pain.  Had MRI left ankle on 08/31/22, however, currently complained of severe right ankle pain. --pt was able to walk with PT on 10/18/22, but since around 10/22/22, pt has been complaining of severe bilateral foot pain and inability to tolerate any dorsi-flexion, let along bear weight on her feet.  Both ankles however look normal without erythema or edema. --WBC, procal trending up, ESR and CRP obtained today which are both elevated, taken together, pt may be having acute gout. Plan: --start prednisone 40 mg daily for empiric acute gout treatment --cont gabapentin 200 mg TID  CHF ruled out --Echo showed normal systolic and diastolic function  Chest pain, ACS ruled out history of CAD status post stent --trop neg.  Cardiology consulted, ruled out ACS.  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  Baseline creatinine 1.4-- 1.7  PAD Carotid artery stenosis --resume home ASA 81 --cont statin --carotid US today as part of stroke workup   History of hypertension --Given the watershed distribution  of her strokes, must avoid hypotension.  Would maintain a SBP goal of 130-150 for now.  --hold home coreg and amlodipine  DM2 with hypoglycemia and hyperglycemia  --cont gargine 10u daily --ACHS and SSI --hold home glimepiride  Chronic  back pain on chronic opioids --switch home percocet to oxycodone to avoid tylenol  Dysuria, resolved --resolved without abx.  Urine cx only 30,000 colonies.  Hold off treating for UTI.  Fever of unknown etiology, resolved --has intermittent fevers.  Blood cx neg so far.  Urine cx only 30,000 colonies.  COVID and RVP neg.  Hypophos --supplemented   DVT prophylaxis: Lovenox SQ Code Status: Full code  Family Communication:  Level of care: Med-Surg Dispo:   The patient is from: home Anticipated d/c is to: SNF rehab Anticipated d/c date is: 1-2 days for medical readiness   Subjective and Interval History:  Pt denied problem with swallowing.  Right jaw pain improved.  Continued to have severe bilateral ankle pain.  WBC and inflammatory markers increased.  Start tx for acute gout today with prednisone.   Objective: Vitals:   10/27/22 0857 10/27/22 1440 10/27/22 1510 10/27/22 1723  BP:    (!) 121/57  Pulse:    97  Resp: _0 Temp:    98.4 F (36.9 C)  TempSrc:    Oral  SpO2:    94%  Weight:      Height:        Intake/Output Summary (Last 24 hours) at 10/27/2022 1746 Last data filed at 10/27/2022 1329 Gross per 24 hour  Intake 93.66 ml  Output 2650 ml  Net -2556.34 ml   Filed Weights   10/17/22 1333  Weight: 65.3 kg    Examination:   Constitutional: NAD, AAOx3 HEENT: conjunctivae and lids normal, EOMI CV: No cyanosis.   RESP: normal respiratory effort Extremities: No effusions, edema in BLE, severe pain with both feet/ankles with minimum movement SKIN: warm, dry Neuro: II - XII grossly intact.     Data Reviewed: I have personally reviewed labs and imaging studies  Time spent: 50 minutes  Enzo Bi, MD [facility] If 7PM-7AM, please contact night-coverage 10/27/2022, 5:46 PM

## 2022-10-27 NOTE — Evaluation (Signed)
Occupational Therapy Evaluation Patient Details Name: Kelly Rowland MRN: [id number] DOB: [birth year] Today's Date: 10/27/2022   History of Present Illness Pt is a 81 y/o F admitted on 10/17/22 after presenting to the ED with c/o generalized weakness, dizziness, & black tarry stools x 2 days. Pt is being treated for GI bleed. PMH: DJD, CAD, CKD, DM2, peptic ulcer disease, HTN, COPD. 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   Clinical Impression   Patient presenting with decreased independence in self care, balance, functional mobility/transfers, endurance, and safety awareness. Prior to admission, pt lived alone, was independent for ADLs/IADLs, and Mod I with AD (rollator or SPC) for functional mobility. Pt currently functioning at Mod-Max A for bed mobility, Min A for static sitting balance, Min A for UB dressing, Max A for LB dressing, and Min A for grooming tasks. Pt with significant pain (10/10) in B feet and LEs, RN present to give pain meds during session. Pt will benefit from acute OT to increase overall independence in the areas of ADLs, functional mobility in order to safely discharge to next venue of care. Upon hospital discharge, recommend STR to maximize pt safety and return to PLOF.    Recommendations for follow up therapy are one component of a multi-disciplinary discharge planning process, led by the attending physician.  Recommendations may be updated based on patient status, additional functional criteria and insurance authorization.   Follow Up Recommendations  Skilled nursing-short term rehab (<3 hours/day)     Assistance Recommended at Discharge Frequent or constant Supervision/Assistance  Patient can return home with the following A lot of help with bathing/dressing/bathroom;A lot of help with walking and/or transfers;Assistance with cooking/housework;Assist for transportation;Help with stairs or ramp for entrance;Assistance with feeding;Direct supervision/assist for medications  management;Direct supervision/assist for financial management    Functional Status Assessment  Patient has had a recent decline in their functional status and demonstrates the ability to make significant improvements in function in a reasonable and predictable amount of time.  Equipment Recommendations  Other (comment) (defer to next venue of care)    Recommendations for Other Services       Precautions / Restrictions Precautions Precautions: Fall Restrictions Weight Bearing Restrictions: No      Mobility Bed Mobility Overal bed mobility: Needs Assistance Bed Mobility: Supine to Sit, Sit to Supine, Rolling Rolling: Mod assist   Supine to sit: Mod assist Sit to supine: Max assist   General bed mobility comments: assist for BLEs, heavy posterior lean upon sitting, requires BUE support on bed in order to maintain static sitting balance, only able to tolerate for limited time (<2 min) 2/2 pain, assisted to return to supine and pt completed rolling L<>R to change soiled pad and mesh underwear    Transfers                   General transfer comment: unable to perform      Balance Overall balance assessment: Needs assistance Sitting-balance support: Bilateral upper extremity supported Sitting balance-Leahy Scale: Poor Sitting balance - Comments: heavy posterior lean, required single UE support from bed rail to maintain position along with intermittent Min A from therapist Postural control: Posterior lean                                 ADL either performed or assessed with clinical judgement   ADL Overall ADL's : Needs assistance/impaired   Eating/Feeding Details (indicate cue type  and reason): foam provided for built up utensil Grooming: Minimal assistance;Bed level Grooming Details (indicate cue type and reason): foam placed on toothbrush, able to bring to mouth using R hand, L hand used as "helper" to stabilize toothbrush and move around inside  mouth, difficulty unscrewing tube of lip moisturizer         Upper Body Dressing : Minimal assistance;Bed level Upper Body Dressing Details (indicate cue type and reason): to don/doff gown Lower Body Dressing: Maximal assistance;Bed level                       Vision Baseline Vision/History: 1 Wears glasses (reading) Patient Visual Report: No change from baseline Vision Assessment?: Yes Eye Alignment: Within Functional Limits Ocular Range of Motion: Within Functional Limits Alignment/Gaze Preference: Within Defined Limits Tracking/Visual Pursuits: Requires cues, head turns, or add eye shifts to track;Decreased smoothness of vertical tracking;Decreased smoothness of horizontal tracking;Impaired - to be further tested in functional context Depth Perception: Undershoots Additional Comments: Denied blurry vision or diplopia     Perception     Praxis      Pertinent Vitals/Pain Pain Assessment Pain Assessment: 0-10 Pain Score: 10-Worst pain ever Pain Location: B feet/legs, back Pain Descriptors / Indicators: Constant, Sharp, Stabbing Pain Intervention(s): Limited activity within patient's tolerance, Repositioned, Monitored during session, RN gave pain meds during session     Hand Dominance Right   Extremity/Trunk Assessment Upper Extremity Assessment Upper Extremity Assessment: Generalized weakness;RUE deficits/detail RUE Deficits / Details: shoulder flexion AROM grossly WFL, poor grip strength (1/5), impaired thumb opposition for 4th/5th digit, full composite fist, decreased speed/accuracy of FM coordination, reported difficulty with self-feeding using dominant R hand   Lower Extremity Assessment Lower Extremity Assessment: Generalized weakness       Communication Communication Communication: No difficulties   Cognition Arousal/Alertness: Awake/alert Behavior During Therapy: WFL for tasks assessed/performed Overall Cognitive Status: Within Functional Limits for  tasks assessed                                 General Comments: A&Ox4, pt frustrated she is not able to do more for herself     General Comments       Exercises Other Exercises Other Exercises: OT provided education re: role of OT, OT POC, post acute recs, sitting up for all meals, EOB/OOB mobility with assistance, home/fall safety.     Shoulder Instructions      Home Living Family/patient expects to be discharged to:: Private residence Living Arrangements: Alone Available Help at Discharge: Family;Available PRN/intermittently Type of Home: Apartment Home Access: Level entry     Home Layout: One level     Bathroom Shower/Tub: Chief Strategy Officer: Standard     Home Equipment: Rollator (4 wheels);Cane - single point;Shower seat;Grab bars - tub/shower;Grab bars - toilet          Prior Functioning/Environment Prior Level of Function : Driving;Independent/Modified Independent             Mobility Comments: Pt reports she uses rollator in the home, [facility] in the community (as pt unable to easily transport rollator). Pt endorses 2-3 falls in the past year 2/2 "falling asleep" while standing. Pt reports she still drives & is independent without assistance from another. ADLs Comments: Pt reports being independent with ADLs and IADLs. Still drives.        OT Problem List: Decreased strength;Decreased range of motion;Decreased activity  tolerance;Impaired UE functional use;Impaired balance (sitting and/or standing);Decreased coordination;Decreased knowledge of use of DME or AE;Cardiopulmonary status limiting activity;Pain;Decreased cognition;Decreased safety awareness      OT Treatment/Interventions: Self-care/ADL training;Therapeutic exercise;Therapeutic activities;Cognitive remediation/compensation;DME and/or AE instruction;Patient/family education;Balance training    OT Goals(Current goals can be found in the care plan section) Acute Rehab OT  Goals Patient Stated Goal: get stronger OT Goal Formulation: With patient Time For Goal Achievement: 11/10/22 Potential to Achieve Goals: Good   OT Frequency: Min 2X/week    Co-evaluation              AM-PAC OT "6 Clicks" Daily Activity     Outcome Measure Help from another person eating meals?: A Little Help from another person taking care of personal grooming?: A Little Help from another person toileting, which includes using toliet, bedpan, or urinal?: A Lot Help from another person bathing (including washing, rinsing, drying)?: A Lot Help from another person to put on and taking off regular upper body clothing?: A Little Help from another person to put on and taking off regular lower body clothing?: A Lot 6 Click Score: 15   End of Session Equipment Utilized During Treatment: Oxygen (2L O2 via Blum) Nurse Communication: Mobility status  Activity Tolerance: Patient limited by fatigue;Patient limited by pain Patient left: in bed;with call bell/phone within reach;with bed alarm set  OT Visit Diagnosis: Unsteadiness on feet (R26.81);Repeated falls (R29.6);Muscle weakness (generalized) (M62.81);Pain;Other symptoms and signs involving the nervous system (R29.898) Pain - Right/Left:  (bilateral) Pain - part of body: Ankle and joints of foot;Leg                Time: 5409-8119 OT Time Calculation (min): 34 min Charges:  OT General Charges $OT Visit: 1 Visit OT Evaluation $OT Eval Moderate Complexity: 1 Mod  [facility] MS, OTR/L ascom [phone]  10/27/22, 6:00 PM

## 2022-10-27 NOTE — Progress Notes (Signed)
Physical Therapy Treatment Patient Details Name: Kelly Rowland MRN: [id number] DOB: 1941-03-31 Today's Date: 10/27/2022   History of Present Illness Pt is a 81 y/o F admitted on 10/17/22 after presenting to the ED with c/o generalized weakness, dizziness, & black tarry stools x 2 days. Pt is being treated for GI bleed. PMH: DJD, CAD, CKD, DM2, peptic ulcer disease, HTN, COPD. 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    PT Comments    Therapy session performed this date with pt complaining of intense pain stating she has been trying to self reposition for over an hour. Assisted with transfers to side of bed with heavy post leaning noted. R hemibody weakness noted and poor sitting tolerance. Encouraged pt to perform dangling multiple times a day with RN staff to improve tolerance in between therapy sessions. Pt reports difficulty with eating, secure chat sent to MD requesting SLP order. Due to acute CVA, PT placed OT order. Will continue to progress. SNF is appropriate rec for dispo at this time.  Recommendations for follow up therapy are one component of a multi-disciplinary discharge planning process, led by the attending physician.  Recommendations may be updated based on patient status, additional functional criteria and insurance authorization.  Follow Up Recommendations  Skilled nursing-short term rehab (<3 hours/day) Can patient physically be transported by private vehicle: No   Assistance Recommended at Discharge Intermittent Supervision/Assistance  Patient can return home with the following A lot of help with walking and/or transfers;A lot of help with bathing/dressing/bathroom;Assistance with cooking/housework;Direct supervision/assist for medications management;Direct supervision/assist for financial management;Assist for transportation;Help with stairs or ramp for entrance   Equipment Recommendations  None recommended by PT    Recommendations for Other Services       Precautions /  Restrictions Precautions Precautions: None Restrictions Weight Bearing Restrictions: No     Mobility  Bed Mobility Overal bed mobility: Needs Assistance Bed Mobility: Supine to Sit     Supine to sit: Mod assist Sit to supine: Mod assist   General bed mobility comments: pt needs assist for B LEs and pt educated to hook L foot under R ankle. Heavy post leaning with pt unaware. Needs to have B UE support on bed in order to maintain upright posture. Only able to tolerate limited time due to pain. Assisted to return to bed and reposition in sidelying.    Transfers                   General transfer comment: unable to perform    Ambulation/Gait                   Stairs             Wheelchair Mobility    Modified Rankin (Stroke Patients Only)       Balance Overall balance assessment: Needs assistance Sitting-balance support: Bilateral upper extremity supported Sitting balance-Leahy Scale: Poor                                      Cognition Arousal/Alertness: Awake/alert Behavior During Therapy: WFL for tasks assessed/performed Overall Cognitive Status: Within Functional Limits for tasks assessed                                 General Comments: pt is frustrated since she is unable to do more and  not able to reposition self in bed        Exercises Other Exercises Other Exercises: attempted seated ther-ex, however trace activiation of B ankles. Heavy education given for mobility    General Comments        Pertinent Vitals/Pain Pain Assessment Pain Assessment: 0-10 Pain Score: 10-Worst pain ever Pain Location: feet bilaterally; buttocks, back Pain Descriptors / Indicators: Constant, Sharp, Stabbing Pain Intervention(s): Limited activity within patient's tolerance, Repositioned    Home Living                          Prior Function            PT Goals (current goals can now be found in the  care plan section) Acute Rehab PT Goals Patient Stated Goal: feel better. PT Goal Formulation: With patient Time For Goal Achievement: 11/01/22 Potential to Achieve Goals: Good Progress towards PT goals: Progressing toward goals    Frequency    Min 2X/week      PT Plan Current plan remains appropriate    Co-evaluation              AM-PAC PT "6 Clicks" Mobility   Outcome Measure  Help needed turning from your back to your side while in a flat bed without using bedrails?: A Lot Help needed moving from lying on your back to sitting on the side of a flat bed without using bedrails?: A Lot Help needed moving to and from a bed to a chair (including a wheelchair)?: Total Help needed standing up from a chair using your arms (e.g., wheelchair or bedside chair)?: Total Help needed to walk in hospital room?: Total Help needed climbing 3-5 steps with a railing? : Total 6 Click Score: 8    End of Session Equipment Utilized During Treatment: Oxygen Activity Tolerance: Patient limited by pain Patient left: in bed;with bed alarm set Nurse Communication: Mobility status PT Visit Diagnosis: Muscle weakness (generalized) (M62.81);Unsteadiness on feet (R26.81);Difficulty in walking, not elsewhere classified (R26.2)     Time: 0272-5366 PT Time Calculation (min) (ACUTE ONLY): 15 min  Charges:  $Therapeutic Activity: 8-22 mins                     Elizabeth Palau, PT, DPT, GCS [phone]    Damyan Corne 10/27/2022, 9:53 AM

## 2022-10-27 NOTE — TOC Progression Note (Signed)
Transition of Care [facility]) - Progression Note    Patient Details  Name: Kelly Rowland MRN: [id number] Date of Birth: 1940-12-25  [facility]) CM/SW Contact  Chapman Fitch, RN Phone Number: 10/27/2022, 10:21 AM  Clinical Narrative:    Per MD patient not medically stable for discharge.  [name] at [employer] notified    Expected Discharge Plan: Home w Home Health Services Barriers to Discharge: Continued Medical Work up  Expected Discharge Plan and Services Expected Discharge Plan: Home w Home Health Services       Living arrangements for the past 2 months: Single Family Home                           HH Arranged: PT [facility] Agency: [facility] Date HH Agency Contacted: 10/18/22   Representative spoke with at [facility] Agency: [name]   Social Determinants of Health (SDOH) Interventions    Readmission Risk Interventions     No data to display

## 2022-10-28 DIAGNOSIS — I6523 Occlusion and stenosis of bilateral carotid arteries: Secondary | ICD-10-CM

## 2022-10-28 DIAGNOSIS — D62 Acute posthemorrhagic anemia: Secondary | ICD-10-CM | POA: Insufficient documentation

## 2022-10-28 DIAGNOSIS — E871 Hypo-osmolality and hyponatremia: Secondary | ICD-10-CM | POA: Insufficient documentation

## 2022-10-28 DIAGNOSIS — I639 Cerebral infarction, unspecified: Secondary | ICD-10-CM | POA: Diagnosis not present

## 2022-10-28 DIAGNOSIS — D500 Iron deficiency anemia secondary to blood loss (chronic): Secondary | ICD-10-CM | POA: Insufficient documentation

## 2022-10-28 DIAGNOSIS — K264 Chronic or unspecified duodenal ulcer with hemorrhage: Secondary | ICD-10-CM | POA: Diagnosis not present

## 2022-10-28 DIAGNOSIS — I63239 Cerebral infarction due to unspecified occlusion or stenosis of unspecified carotid arteries: Secondary | ICD-10-CM | POA: Diagnosis not present

## 2022-10-28 DIAGNOSIS — N1831 Chronic kidney disease, stage 3a: Secondary | ICD-10-CM | POA: Insufficient documentation

## 2022-10-28 DIAGNOSIS — N1832 Chronic kidney disease, stage 3b: Secondary | ICD-10-CM | POA: Insufficient documentation

## 2022-10-28 LAB — BASIC METABOLIC PANEL
Anion gap: 14 (ref 5–15)
BUN: 44 mg/dL — ABNORMAL HIGH (ref 8–23)
CO2: 33 mmol/L — ABNORMAL HIGH (ref 22–32)
Calcium: 8.3 mg/dL — ABNORMAL LOW (ref 8.9–10.3)
Chloride: 90 mmol/L — ABNORMAL LOW (ref 98–111)
Creatinine, Ser: 1.4 mg/dL — ABNORMAL HIGH (ref 0.44–1.00)
GFR, Estimated: 38 mL/min — ABNORMAL LOW (ref >60)
Glucose, Bld: 229 mg/dL — ABNORMAL HIGH (ref 70–99)
Potassium: 3.7 mmol/L (ref 3.5–5.1)
Sodium: 137 mmol/L (ref 135–145)

## 2022-10-28 LAB — GLUCOSE, CAPILLARY
Glucose-Capillary: 182 mg/dL — ABNORMAL HIGH (ref 70–99)
Glucose-Capillary: 295 mg/dL — ABNORMAL HIGH (ref 70–99)
Glucose-Capillary: 428 mg/dL — ABNORMAL HIGH (ref 70–99)
Glucose-Capillary: 411 mg/dL — ABNORMAL HIGH (ref 70–99)
Glucose-Capillary: 333 mg/dL — ABNORMAL HIGH (ref 70–99)

## 2022-10-28 LAB — CBC
HCT: 27.4 % — ABNORMAL LOW (ref 36.0–46.0)
Hemoglobin: 8.7 g/dL — ABNORMAL LOW (ref 12.0–15.0)
MCH: 28.1 pg (ref 26.0–34.0)
MCHC: 31.8 g/dL (ref 30.0–36.0)
MCV: 88.4 fL (ref 80.0–100.0)
Platelets: 348 10*3/uL (ref 150–400)
RBC: 3.1 MIL/uL — ABNORMAL LOW (ref 3.87–5.11)
RDW: 16 % — ABNORMAL HIGH (ref 11.5–15.5)
WBC: 18 10*3/uL — ABNORMAL HIGH (ref 4.0–10.5)
nRBC: 0 % (ref 0.0–0.2)

## 2022-10-28 LAB — MAGNESIUM: Magnesium: 2.3 mg/dL (ref 1.7–2.4)

## 2022-10-28 MED ORDER — PREDNISONE 20 MG PO TABS
20.0000 mg | ORAL_TABLET | Freq: Every day | ORAL | Status: DC
  Administered 2022-10-29: 20 mg via ORAL
  Filled 2022-10-28: qty 1

## 2022-10-28 MED ORDER — INSULIN ASPART 100 UNIT/ML IJ SOLN
10.0000 [IU] | Freq: Once | INTRAMUSCULAR | Status: AC
  Administered 2022-10-28: 10 [IU] via SUBCUTANEOUS
  Filled 2022-10-28: qty 1

## 2022-10-28 MED ORDER — ENOXAPARIN SODIUM 30 MG/0.3ML IJ SOSY
30.0000 mg | PREFILLED_SYRINGE | INTRAMUSCULAR | Status: DC
  Administered 2022-10-28: 30 mg via SUBCUTANEOUS
  Filled 2022-10-28: qty 0.3

## 2022-10-28 MED ORDER — LEVOFLOXACIN 250 MG PO TABS
250.0000 mg | ORAL_TABLET | Freq: Every day | ORAL | Status: AC
  Administered 2022-10-28 – 2022-10-29 (×2): 250 mg via ORAL
  Filled 2022-10-28 (×2): qty 1

## 2022-10-28 NOTE — Progress Notes (Addendum)
Subjective: Complains of right sided weakness. Trouble lifting RLE off bed and difficulty gripping objects with right hand.   Objective: Current vital signs: BP (!) 142/48 (BP Location: Left Arm)   Pulse 79   Temp 98 F (36.7 C) (Oral)   Resp 16   Ht 5\' 2"  (1.575 m)   Wt 65.3 kg   SpO2 98%   BMI 26.34 kg/m  Vital signs in last 24 hours: Temp:  [98 F (36.7 C)-98.6 F (37 C)] 98 F (36.7 C) (11/15 0734) Pulse Rate:  [79-97] 79 (11/15 0734) Resp:  [15-19] 16 (11/15 0734) BP: (121-142)/(48-59) 142/48 (11/15 0734) SpO2:  [94 %-98 %] 98 % (11/15 0734)  Intake/Output from previous day: 11/14 0701 - 11/15 0700 In: 120 [P.O.:120] Out: 2100 [Urine:2100] Intake/Output this shift: No intake/output data recorded. Nutritional status:  Diet Order             DIET SOFT Room service appropriate? No; Fluid consistency: Thin  Diet effective now                  HEENT: [place]/AT Lungs: Respirations unlabored Ext: No edema  Neurologic Exam: Ment: Awake, alert and oriented. No expressive or receptive aphasia.  CN: Fixates normally. Temporal visual fields intact with no extinction to DSS. Saccades towards visual stimuli are normal. EOMI. No nystagmus. Smile symmetric. Phonation intact.  Motor: 5/5 LUE and LLE RUE 4-/5 proximally and distally RLE 4/5 Sensory: Intact to FT x 4 with no asymmetry. No extinction. Cerebellar: No ataxia with FNF bilaterally Gait: Deferred  Lab Results: Results for orders placed or performed during the hospital encounter of 10/17/22 (from the past 48 hour(s))  Potassium     Status: Abnormal   Collection Time: 10/26/22 11:45 AM  Result Value Ref Range   Potassium 3.3 (L) 3.5 - 5.1 mmol/L    Comment: Performed at [facility], [address] [place], [place] [address]  Glucose, capillary     Status: Abnormal   Collection Time: 10/26/22 11:55 AM  Result Value Ref Range   Glucose-Capillary 103 (H) 70 - 99 mg/dL    Comment: Glucose reference  range applies only to samples taken after fasting for at least 8 hours.  Glucose, capillary     Status: Abnormal   Collection Time: 10/26/22  5:08 PM  Result Value Ref Range   Glucose-Capillary 58 (L) 70 - 99 mg/dL    Comment: Glucose reference range applies only to samples taken after fasting for at least 8 hours.  Glucose, capillary     Status: Abnormal   Collection Time: 10/26/22  5:43 PM  Result Value Ref Range   Glucose-Capillary 128 (H) 70 - 99 mg/dL    Comment: Glucose reference range applies only to samples taken after fasting for at least 8 hours.  Lipid panel     Status: Abnormal   Collection Time: 10/26/22  6:11 PM  Result Value Ref Range   Cholesterol 73 0 - 200 mg/dL   Triglycerides 103 <159 mg/dL   HDL 10 (L) >45 mg/dL   Total CHOL/HDL Ratio 7.3 RATIO   VLDL 24 0 - 40 mg/dL   LDL Cholesterol 39 0 - 99 mg/dL    Comment:        Total Cholesterol/HDL:CHD Risk Coronary Heart Disease Risk Table                     Men   Women  1/2 Average Risk   3.4  3.3  Average Risk       5.0   4.4  2 X Average Risk   9.6   7.1  3 X Average Risk  23.4   11.0        Use the calculated Patient Ratio above and the CHD Risk Table to determine the patient's CHD Risk.        ATP III CLASSIFICATION (LDL):  <100     mg/dL   Optimal  100-129  mg/dL   Near or Above                    Optimal  130-159  mg/dL   Borderline  160-189  mg/dL   High  >190     mg/dL   Very High Performed at [facility], [address] [place], [place] [address]   Glucose, capillary     Status: Abnormal   Collection Time: 10/26/22  9:03 PM  Result Value Ref Range   Glucose-Capillary 64 (L) 70 - 99 mg/dL    Comment: Glucose reference range applies only to samples taken after fasting for at least 8 hours.  Glucose, capillary     Status: Abnormal   Collection Time: 10/26/22 10:31 PM  Result Value Ref Range   Glucose-Capillary 121 (H) 70 - 99 mg/dL    Comment: Glucose reference range applies only  to samples taken after fasting for at least 8 hours.  Magnesium     Status: None   Collection Time: 10/27/22  5:10 AM  Result Value Ref Range   Magnesium 2.2 1.7 - 2.4 mg/dL    Comment: Performed at [facility], [address] [place], [place] [id number]  Basic metabolic panel     Status: Abnormal   Collection Time: 10/27/22  5:10 AM  Result Value Ref Range   Sodium 138 135 - 145 mmol/L   Potassium 3.2 (L) 3.5 - 5.1 mmol/L   Chloride 95 (L) 98 - 111 mmol/L   CO2 32 22 - 32 mmol/L   Glucose, Bld 71 70 - 99 mg/dL    Comment: Glucose reference range applies only to samples taken after fasting for at least 8 hours.   BUN 37 (H) 8 - 23 mg/dL   Creatinine, Ser 1.15 (H) 0.44 - 1.00 mg/dL   Calcium 8.1 (L) 8.9 - 10.3 mg/dL   GFR, Estimated 48 (L) >60 mL/min    Comment: (NOTE) Calculated using the CKD-EPI Creatinine Equation (2021)    Anion gap 11 5 - 15    Comment: Performed at [facility], [address] [place], [place] [address]  CBC     Status: Abnormal   Collection Time: 10/27/22  5:10 AM  Result Value Ref Range   WBC 19.1 (H) 4.0 - 10.5 K/uL   RBC 2.85 (L) 3.87 - 5.11 MIL/uL   Hemoglobin 8.1 (L) 12.0 - 15.0 g/dL   HCT 25.4 (L) 36.0 - 46.0 %   MCV 89.1 80.0 - 100.0 fL   MCH 28.4 26.0 - 34.0 pg   MCHC 31.9 30.0 - 36.0 g/dL   RDW 15.8 (H) 11.5 - 15.5 %   Platelets 325 150 - 400 K/uL   nRBC 0.0 0.0 - 0.2 %    Comment: Performed at [facility], [address] [place], [place] [address]  Sedimentation rate     Status: Abnormal   Collection Time: 10/27/22  5:10 AM  Result Value Ref Range   Sed Rate 128 (H)  0 - 30 mm/hr    Comment: Performed at [facility], [address] [place], [place] [address]  Procalcitonin - Baseline     Status: None   Collection Time: 10/27/22  5:10 AM  Result Value Ref Range   Procalcitonin 2.03 ng/mL    Comment:        Interpretation: PCT > 2 ng/mL: Systemic infection (sepsis) is likely, unless other  causes are known. (NOTE)       Sepsis PCT Algorithm           Lower Respiratory Tract                                      Infection PCT Algorithm    ----------------------------     ----------------------------         PCT < 0.25 ng/mL                PCT < 0.10 ng/mL          Strongly encourage             Strongly discourage   discontinuation of antibiotics    initiation of antibiotics    ----------------------------     -----------------------------       PCT 0.25 - 0.50 ng/mL            PCT 0.10 - 0.25 ng/mL               OR       >80% decrease in PCT            Discourage initiation of                                            antibiotics      Encourage discontinuation           of antibiotics    ----------------------------     -----------------------------         PCT >= 0.50 ng/mL              PCT 0.26 - 0.50 ng/mL               AND       <80% decrease in PCT              Encourage initiation of                                             antibiotics       Encourage continuation           of antibiotics    ----------------------------     -----------------------------        PCT >= 0.50 ng/mL                  PCT > 0.50 ng/mL               AND         increase in PCT                  Strongly encourage  initiation of antibiotics    Strongly encourage escalation           of antibiotics                                     -----------------------------                                           PCT <= 0.25 ng/mL                                                 OR                                        > 80% decrease in PCT                                      Discontinue / Do not initiate                                             antibiotics  Performed at [facility], [address] [place], [place] [address]   Glucose, capillary     Status: Abnormal   Collection Time: 10/27/22  8:54 AM  Result Value Ref Range    Glucose-Capillary 67 (L) 70 - 99 mg/dL    Comment: Glucose reference range applies only to samples taken after fasting for at least 8 hours.   Comment 1 Notify RN    Comment 2 Document in Chart   Glucose, capillary     Status: None   Collection Time: 10/27/22  9:11 AM  Result Value Ref Range   Glucose-Capillary 73 70 - 99 mg/dL    Comment: Glucose reference range applies only to samples taken after fasting for at least 8 hours.  Uric acid     Status: Abnormal   Collection Time: 10/27/22 10:43 AM  Result Value Ref Range   Uric Acid, Serum 9.8 (H) 2.5 - 7.1 mg/dL    Comment: Performed at [facility], [address] [place], [place] [address]  C-reactive protein     Status: Abnormal   Collection Time: 10/27/22 10:52 AM  Result Value Ref Range   CRP 27.4 (H) <1.0 mg/dL    Comment: Performed at [place] [address], [place], [place] [address]  Glucose, capillary     Status: None   Collection Time: 10/27/22  1:28 PM  Result Value Ref Range   Glucose-Capillary 79 70 - 99 mg/dL    Comment: Glucose reference range applies only to samples taken after fasting for at least 8 hours.  Glucose, capillary     Status: None   Collection Time: 10/27/22  5:27 PM  Result Value Ref Range   Glucose-Capillary 76 70 - 99 mg/dL    Comment: Glucose reference range applies only to samples taken after fasting for  at least 8 hours.  Glucose, capillary     Status: Abnormal   Collection Time: 10/27/22  9:01 PM  Result Value Ref Range   Glucose-Capillary 231 (H) 70 - 99 mg/dL    Comment: Glucose reference range applies only to samples taken after fasting for at least 8 hours.   Comment 1 Notify RN   Basic metabolic panel     Status: Abnormal   Collection Time: 10/28/22  4:20 AM  Result Value Ref Range   Sodium 137 135 - 145 mmol/L   Potassium 3.7 3.5 - 5.1 mmol/L   Chloride 90 (L) 98 - 111 mmol/L   CO2 33 (H) 22 - 32 mmol/L   Glucose, Bld 229 (H) 70 - 99 mg/dL    Comment: Glucose  reference range applies only to samples taken after fasting for at least 8 hours.   BUN 44 (H) 8 - 23 mg/dL   Creatinine, Ser 1.40 (H) 0.44 - 1.00 mg/dL   Calcium 8.3 (L) 8.9 - 10.3 mg/dL   GFR, Estimated 38 (L) >60 mL/min    Comment: (NOTE) Calculated using the CKD-EPI Creatinine Equation (2021)    Anion gap 14 5 - 15    Comment: Performed at [facility], [address] [place], [place] [address]  CBC     Status: Abnormal   Collection Time: 10/28/22  4:20 AM  Result Value Ref Range   WBC 18.0 (H) 4.0 - 10.5 K/uL   RBC 3.10 (L) 3.87 - 5.11 MIL/uL   Hemoglobin 8.7 (L) 12.0 - 15.0 g/dL   HCT 27.4 (L) 36.0 - 46.0 %   MCV 88.4 80.0 - 100.0 fL   MCH 28.1 26.0 - 34.0 pg   MCHC 31.8 30.0 - 36.0 g/dL   RDW 16.0 (H) 11.5 - 15.5 %   Platelets 348 150 - 400 K/uL   nRBC 0.0 0.0 - 0.2 %    Comment: Performed at [facility], [address] [place], [place] [address]  Magnesium     Status: None   Collection Time: 10/28/22  4:20 AM  Result Value Ref Range   Magnesium 2.3 1.7 - 2.4 mg/dL    Comment: Performed at [facility], [address] [place], [place] [address]  Glucose, capillary     Status: Abnormal   Collection Time: 10/28/22  8:05 AM  Result Value Ref Range   Glucose-Capillary 182 (H) 70 - 99 mg/dL    Comment: Glucose reference range applies only to samples taken after fasting for at least 8 hours.    Recent Results (from the past 240 hour(s))  Culture, blood (Routine X 2) w Reflex to ID Panel     Status: None   Collection Time: 10/21/22 10:31 AM   Specimen: BLOOD  Result Value Ref Range Status   Specimen Description BLOOD BLOOD LEFT HAND  Final   Special Requests   Final    BOTTLES DRAWN AEROBIC AND ANAEROBIC Blood Culture adequate volume   Culture   Final    NO GROWTH 5 DAYS Performed at [facility], [address] [place], [place] [address]    Report Status 10/26/2022 FINAL  Final  Culture, blood (Routine X 2) w Reflex to ID  Panel     Status: None   Collection Time: 10/21/22 10:31 AM   Specimen: BLOOD  Result Value Ref Range Status   Specimen Description BLOOD BLOOD RIGHT HAND  Final   Special Requests   Final  BOTTLES DRAWN AEROBIC AND ANAEROBIC Blood Culture adequate volume   Culture   Final    NO GROWTH 5 DAYS Performed at [facility], [address] [place], [place] [address]    Report Status 10/26/2022 FINAL  Final  Urine Culture     Status: Abnormal   Collection Time: 10/21/22  1:00 PM   Specimen: Urine, Random  Result Value Ref Range Status   Specimen Description   Final    URINE, RANDOM Performed at [facility], [address], [place], [place] [address]    Special Requests   Final    NONE Performed at [facility], [place], [place] [address]    Culture 30,000 COLONIES/mL PROTEUS MIRABILIS (A)  Final   Report Status 10/23/2022 FINAL  Final   Organism ID, Bacteria PROTEUS MIRABILIS (A)  Final      Susceptibility   Proteus mirabilis - MIC*    AMPICILLIN <=2 SENSITIVE Sensitive     CEFAZOLIN <=4 SENSITIVE Sensitive     CEFEPIME <=0.12 SENSITIVE Sensitive     CEFTRIAXONE <=0.25 SENSITIVE Sensitive     CIPROFLOXACIN <=0.25 SENSITIVE Sensitive     GENTAMICIN <=1 SENSITIVE Sensitive     IMIPENEM 2 SENSITIVE Sensitive     NITROFURANTOIN 128 RESISTANT Resistant     TRIMETH/SULFA <=20 SENSITIVE Sensitive     AMPICILLIN/SULBACTAM <=2 SENSITIVE Sensitive     PIP/TAZO <=4 SENSITIVE Sensitive     * 30,000 COLONIES/mL PROTEUS MIRABILIS  SARS Coronavirus 2 by RT PCR (hospital order, performed in [place] hospital lab) *cepheid single result test* Anterior Nasal Swab     Status: None   Collection Time: 10/22/22 12:18 PM   Specimen: Anterior Nasal Swab  Result Value Ref Range Status   SARS Coronavirus 2 by RT PCR NEGATIVE NEGATIVE Final    Comment: (NOTE) SARS-CoV-2 target nucleic acids are NOT DETECTED.  The SARS-CoV-2 RNA is generally  detectable in upper and lower respiratory specimens during the acute phase of infection. The lowest concentration of SARS-CoV-2 viral copies this assay can detect is 250 copies / mL. A negative result does not preclude SARS-CoV-2 infection and should not be used as the sole basis for treatment or other patient management decisions.  A negative result may occur with improper specimen collection / handling, submission of specimen other than nasopharyngeal swab, presence of viral mutation(s) within the areas targeted by this assay, and inadequate number of viral copies (<250 copies / mL). A negative result must be combined with clinical observations, patient history, and epidemiological information.  Fact Sheet for Patients:   https://www.patel.info/  Fact Sheet for Healthcare Providers: https://hall.com/  This test is not yet approved or  cleared by the [place] FDA and has been authorized for detection and/or diagnosis of SARS-CoV-2 by FDA under an Emergency Use Authorization (EUA).  This EUA will remain in effect (meaning this test can be used) for the duration of the COVID-19 declaration under Section 564(b)(1) of the Act, 21 U.S.C. section 360bbb-3(b)(1), unless the authorization is terminated or revoked sooner.  Performed at [facility], [place], [place] [address]   Respiratory (~20 pathogens) panel by PCR     Status: None   Collection Time: 10/22/22 12:18 PM   Specimen: Nasopharyngeal Swab; Respiratory  Result Value Ref Range Status   Adenovirus NOT DETECTED NOT DETECTED Final   Coronavirus 229E NOT DETECTED NOT DETECTED Final    Comment: (NOTE) The Coronavirus on the Respiratory Panel,  DOES NOT test for the novel  Coronavirus (2019 nCoV)    Coronavirus HKU1 NOT DETECTED NOT DETECTED Final   Coronavirus NL63 NOT DETECTED NOT DETECTED Final   Coronavirus OC43 NOT DETECTED NOT DETECTED Final    Metapneumovirus NOT DETECTED NOT DETECTED Final   Rhinovirus / Enterovirus NOT DETECTED NOT DETECTED Final   Influenza A NOT DETECTED NOT DETECTED Final   Influenza B NOT DETECTED NOT DETECTED Final   Parainfluenza Virus 1 NOT DETECTED NOT DETECTED Final   Parainfluenza Virus 2 NOT DETECTED NOT DETECTED Final   Parainfluenza Virus 3 NOT DETECTED NOT DETECTED Final   Parainfluenza Virus 4 NOT DETECTED NOT DETECTED Final   Respiratory Syncytial Virus NOT DETECTED NOT DETECTED Final   Bordetella pertussis NOT DETECTED NOT DETECTED Final   Bordetella Parapertussis NOT DETECTED NOT DETECTED Final   Chlamydophila pneumoniae NOT DETECTED NOT DETECTED Final   Mycoplasma pneumoniae NOT DETECTED NOT DETECTED Final    Comment: Performed at [facility], [address], [place], [place] [address]    Lipid Panel Recent Labs    10/26/22 1811  CHOL 73  TRIG 122  HDL 10*  CHOLHDL 7.3  VLDL 24  LDLCALC 39    Studies/Results: US Carotid Bilateral  Result Date: 10/27/2022 CLINICAL DATA:  81 year old female with a history of stroke EXAM: BILATERAL CAROTID DUPLEX ULTRASOUND TECHNIQUE: Wallace Cullens scale imaging, color Doppler and duplex ultrasound were performed of bilateral carotid and vertebral arteries in the neck. COMPARISON:  None Available. FINDINGS: Criteria: Quantification of carotid stenosis is based on velocity parameters that correlate the residual internal carotid diameter with NASCET-based stenosis levels, using the diameter of the distal internal carotid lumen as the denominator for stenosis measurement. The following velocity measurements were obtained: RIGHT ICA:  Systolic 149 cm/sec, Diastolic 32 cm/sec CCA:  71 cm/sec SYSTOLIC ICA/CCA RATIO:  2.1 ECA:  154 cm/sec LEFT ICA:  Systolic 491 cm/sec, Diastolic 175 cm/sec CCA:  34 cm/sec SYSTOLIC ICA/CCA RATIO:  14.4 ECA:  155 cm/sec Right Brachial SBP: Not acquired Left Brachial SBP: Not acquired RIGHT CAROTID ARTERY: No significant  calcifications of the right common carotid artery. Intermediate waveform maintained. Moderate heterogeneous and partially calcified plaque at the right carotid bifurcation. Some shadowing present. Low resistance waveform of the right ICA. No significant tortuosity. RIGHT VERTEBRAL ARTERY: Antegrade flow with low resistance waveform. LEFT CAROTID ARTERY: No significant calcifications of the left common carotid artery. Intermediate waveform maintained. Moderate heterogeneous and partially calcified plaque at the left carotid bifurcation. Shadowing present. Low resistance waveform of the left ICA. No significant tortuosity. LEFT VERTEBRAL ARTERY:  Antegrade flow with low resistance waveform. 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 Left: Heterogeneous and partially calcified plaque at the left carotid bifurcation contributes to 70%-99% stenosis by established duplex criteria. Signed, Yvone Neu. Miachel Roux, RPVI [facility] Electronically Signed   By: Gilmer Mor D.O.   On: 10/27/2022 14:23   MR BRAIN WO CONTRAST  Result Date: 10/26/2022 CLINICAL DATA:  Neuro deficit, acute, stroke suspected. EXAM: MRI HEAD WITHOUT CONTRAST MRA HEAD WITHOUT CONTRAST TECHNIQUE: Multiplanar, multi-echo pulse sequences of the brain and surrounding structures were acquired without intravenous contrast. Angiographic images of the Circle of Willis were acquired using MRA technique without intravenous contrast. COMPARISON:  Head CT October 25, 2022. FINDINGS: MRI HEAD FINDINGS The study is degraded by motion. Brain: Scattered foci of restricted diffusion  are seen in the left centrum semiovale, corona radiata and basal ganglia region in a watershed type distribution. No evidence of hemorrhagic transformation. No hydrocephalus, extra-axial collection or mass  lesion. Scattered and confluent foci of T2 hyperintensity are seen within the white matter of the cerebral hemispheres, nonspecific, most likely related to chronic small vessel ischemia. Vascular: Normal flow voids. Skull and upper cervical spine: Grossly unremarkable. Sinuses/Orbits: No acute or significant finding. Other: Right temporomandibular joint effusion. MRA HEAD FINDINGS The study is degraded by motion. Anterior circulation: Asymmetry in caliber of the visualized cervical and intracranial internal carotid arteries, with smaller caliber of the left ICA compared to the right. Luminal irregularity is noted in the cavernous segment bilaterally, suggestive of intracranial atherosclerotic disease without hemodynamically significant stenosis. Flow related enhancement is maintained. Flow related enhancement is also preserved in the bilateral ACA and MCA vascular trees. Diffuse luminal irregularity related to motion artifacts limits evaluation for focal stenosis. Posterior circulation: Normal caliber flow related enhancement of the bilateral intracranial vertebral arteries and basilar artery. The bilateral posterior cerebral arteries are patent. Diffuse luminal irregularity related to motion artifacts limits evaluation for focal stenosis. Anatomic variants: None significant. IMPRESSION: 1. 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 2. Moderate chronic microvascular ischemic changes of the white matter. 3. Motion degraded MR angiogram of the head without evidence of hemodynamically significant stenosis. Electronically Signed   By: Pedro Earls M.D.   On: 10/26/2022 15:02   MR ANGIO HEAD WO CONTRAST  Result Date: 10/26/2022 CLINICAL DATA:  Neuro deficit, acute, stroke suspected. EXAM: MRI HEAD WITHOUT CONTRAST MRA HEAD WITHOUT CONTRAST TECHNIQUE: Multiplanar, multi-echo pulse sequences of the brain and  surrounding structures were acquired without intravenous contrast. Angiographic images of the Circle of Willis were acquired using MRA technique without intravenous contrast. COMPARISON:  Head CT October 25, 2022. FINDINGS: MRI HEAD FINDINGS The study is degraded by motion. Brain: Scattered foci of restricted diffusion are seen in the left centrum semiovale, corona radiata and basal ganglia region in a watershed type distribution. No evidence of hemorrhagic transformation. No hydrocephalus, extra-axial collection or mass lesion. Scattered and confluent foci of T2 hyperintensity are seen within the white matter of the cerebral hemispheres, nonspecific, most likely related to chronic small vessel ischemia. Vascular: Normal flow voids. Skull and upper cervical spine: Grossly unremarkable. Sinuses/Orbits: No acute or significant finding. Other: Right temporomandibular joint effusion. MRA HEAD FINDINGS The study is degraded by motion. Anterior circulation: Asymmetry in caliber of the visualized cervical and intracranial internal carotid arteries, with smaller caliber of the left ICA compared to the right. Luminal irregularity is noted in the cavernous segment bilaterally, suggestive of intracranial atherosclerotic disease without hemodynamically significant stenosis. Flow related enhancement is maintained. Flow related enhancement is also preserved in the bilateral ACA and MCA vascular trees. Diffuse luminal irregularity related to motion artifacts limits evaluation for focal stenosis. Posterior circulation: Normal caliber flow related enhancement of the bilateral intracranial vertebral arteries and basilar artery. The bilateral posterior cerebral arteries are patent. Diffuse luminal irregularity related to motion artifacts limits evaluation for focal stenosis. Anatomic variants: None significant. IMPRESSION: 1. Acute/subacute infarcts in the left centrum semiovale, corona radiata and basal ganglia region in a watershed  type distribution. 2. Moderate chronic microvascular ischemic changes of the white matter. 3. Motion degraded MR angiogram of the head without evidence of hemodynamically significant stenosis. Electronically Signed   By: Sandre Kitty.D.  On: 10/26/2022 15:02    Medications: Scheduled:  sodium chloride   Intravenous Once   aspirin EC  81 mg Oral Daily   cholecalciferol  2,000 Units Oral Daily   cyanocobalamin  500 mcg Oral Daily   enoxaparin (LOVENOX) injection  40 mg Subcutaneous Q24H   feeding supplement  237 mL Oral TID BM   folic acid  [id number] mcg Oral Daily   gabapentin  200 mg Oral TID   insulin aspart  0-15 Units Subcutaneous TID WC   insulin glargine-yfgn  10 Units Subcutaneous Daily   levofloxacin  250 mg Oral Daily   mometasone-formoterol  2 puff Inhalation BID   pantoprazole  40 mg Oral BID   predniSONE  40 mg Oral Q breakfast   senna  1 tablet Oral BID   simvastatin  20 mg Oral QHS    Assessment: 81 y.o. female with a PMHx of CAD, CKD, arthritis, COPD, DM, HLD, HTN and RLS who was admitted on 11/4 for management of GI bleed, which has resolved, was waiting on SNF rehab.  On Monday, for the first time, patient told staff that she thought she had a stroke because her right hand had been weak for the past couple of days. 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  - Exam reveals findings that are most consistent with a stroke localizing to the left hemisphere.  . - Recent TTE from 11/6: LVEF 55 to 60% with normal LV function and no regional wall motion abnormalities, but with mild left ventricular hypertrophy. Left atrial size mildly dilated. The aortic valve is normal in structure. Aortic valve regurgitation is not visualized. Aortic valve sclerosis is present, with no evidence of aortic valve stenosis. No mural thrombus mentioned in the report.   - Also seen on  MRI: Moderate chronic microvascular ischemic changes of the white matter.  - MRA head: Motion degraded MR angiogram of the head without evidence of hemodynamically significant stenosis. - Elevated BUN and Cr. Cr now at her baseline in the context of her CKD.   - Carotid ultrasound: Right: Heterogeneous and partially calcified plaque at the right carotid bifurcation contributes to 50%-69% stenosis by established duplex criteria. Left: Heterogeneous and partially calcified plaque at the left carotid bifurcation contributes to 70%-99% stenosis by established duplex criteria - Has been started on ASA. Given her severe symptomatic carotid atherosclerosis, benefits of antiplatelet medication outweigh the risks despite her recent GIB.   Recommendations: - Vascular Surgery consult with Dr. Lucky Cowboy for consideration of possible left CEA. Her severe left ICA stenosis is felt to be the most likely etiology for her acute left watershed stroke. There is relatively high probability that her left ICA atherosclerotic disease will progress and eventually become completely occluded, which puts her at high risk of an eventual massive left hemispheric stroke.  - Cardiac telemetry  - BP management. Given the watershed distribution of her strokes, must avoid hypotension. Would maintain a SBP goal of 130-150 for now.  - Encourage PO hydration.  - PT/OT/Speech - Frequent neuro checks   LOS: 11 days   @Electronically  signed: Dr. Kerney Elbe 10/28/2022  10:36 AM

## 2022-10-28 NOTE — Progress Notes (Signed)
Physical Therapy Treatment Patient Details Name: Kelly Rowland MRN: [id number] DOB: 1941/10/25 Today's Date: 10/28/2022   History of Present Illness Pt is a 81 y/o F admitted on 10/17/22 after presenting to the ED with c/o generalized weakness, dizziness, & black tarry stools x 2 days. Pt is being treated for GI bleed. PMH: DJD, CAD, CKD, DM2, peptic ulcer disease, HTN, COPD. 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    PT Comments    Reports severe pain in B feet (mainly R foot). Unable to perform ROM or apply pressure. Able to sit at EOB with ability to maintain upright posture. Unable to perform lateral scoots, however needed max assist. Educated on lateral scoots to recliner, however pt declined. Will continue to progress as able.   Recommendations for follow up therapy are one component of a multi-disciplinary discharge planning process, led by the attending physician.  Recommendations may be updated based on patient status, additional functional criteria and insurance authorization.  Follow Up Recommendations  Skilled nursing-short term rehab (<3 hours/day) Can patient physically be transported by private vehicle: No   Assistance Recommended at Discharge Intermittent Supervision/Assistance  Patient can return home with the following A lot of help with walking and/or transfers;A lot of help with bathing/dressing/bathroom;Assistance with cooking/housework;Direct supervision/assist for medications management;Direct supervision/assist for financial management;Assist for transportation;Help with stairs or ramp for entrance   Equipment Recommendations  None recommended by PT    Recommendations for Other Services       Precautions / Restrictions Precautions Precautions: Fall Restrictions Weight Bearing Restrictions: No     Mobility  Bed Mobility Overal bed mobility: Needs Assistance Bed Mobility: Supine to Sit, Sit to Supine, Rolling     Supine to sit: Min assist Sit to supine: Mod assist    General bed mobility comments: improved technique getting up towards R side of bed. Once seated, improved balance noted. Attempted lateral scoots up towards HOB, however unable without max assist. Fatigues quickly and then request to return back to supine    Transfers                   General transfer comment: unable to perform    Ambulation/Gait                   Stairs             Wheelchair Mobility    Modified Rankin (Stroke Patients Only)       Balance Overall balance assessment: Needs assistance Sitting-balance support: Bilateral upper extremity supported Sitting balance-Leahy Scale: Fair                                      Cognition Arousal/Alertness: Awake/alert Behavior During Therapy: WFL for tasks assessed/performed Overall Cognitive Status: Within Functional Limits for tasks assessed                                          Exercises Other Exercises Other Exercises: reviewed HEP and encouraged to continue throughout hospialization    General Comments        Pertinent Vitals/Pain Pain Assessment Pain Assessment: 0-10 Pain Score: 10-Worst pain ever Pain Location: B feet/legs, back Pain Descriptors / Indicators: Constant, Sharp, Stabbing Pain Intervention(s): Limited activity within patient's tolerance, Repositioned    Home Living  Prior Function            PT Goals (current goals can now be found in the care plan section) Acute Rehab PT Goals Patient Stated Goal: feel better. PT Goal Formulation: With patient Time For Goal Achievement: 11/01/22 Potential to Achieve Goals: Good Progress towards PT goals: Progressing toward goals    Frequency    Min 2X/week      PT Plan Current plan remains appropriate    Co-evaluation              AM-PAC PT "6 Clicks" Mobility   Outcome Measure  Help needed turning from your back to your side while  in a flat bed without using bedrails?: A Little Help needed moving from lying on your back to sitting on the side of a flat bed without using bedrails?: A Little Help needed moving to and from a bed to a chair (including a wheelchair)?: Total Help needed standing up from a chair using your arms (e.g., wheelchair or bedside chair)?: Total Help needed to walk in hospital room?: Total Help needed climbing 3-5 steps with a railing? : Total 6 Click Score: 10    End of Session Equipment Utilized During Treatment: Oxygen Activity Tolerance: Patient limited by pain Patient left: in bed;with bed alarm set Nurse Communication: Mobility status PT Visit Diagnosis: Muscle weakness (generalized) (M62.81);Unsteadiness on feet (R26.81);Difficulty in walking, not elsewhere classified (R26.2)     Time: 1416-1430 PT Time Calculation (min) (ACUTE ONLY): 14 min  Charges:  $Therapeutic Activity: 8-22 mins                     Elizabeth Palau, PT, DPT, GCS [phone]    Kelly Rowland 10/28/2022, 3:40 PM

## 2022-10-28 NOTE — Consult Note (Signed)
[facility] Vascular Consult Note  MRN : [id number]  Kelly Rowland is a 81 y.o. (1941/01/20) female who presents with chief complaint of  Chief Complaint  Patient presents with   Melena  .   Consulting Physician: Dr. Sharen Hones MD Reason for consult: Stroke/ Carotid Stenosis History of Present Illness: Kelly Rowland is a 81 year old female with a past medical history of CAD, CT Kd, arthritis, COPD, DM, HLD, HTN, and RLS who was admitted on 11 4 for management of a GI bleed which has now recent resolved.  While waiting for SNF rehab placement this past Monday the patient told staff that she felt she had a stroke because her right hand had become weak and her right leg had become very painful and unable to move well.  An MRI of her brain revealed acute/subacute infarcts to the left side in a watershed type distribution.  MRI also showed moderate microvascular ischemic changes of her white matter.  On exam findings are most consistent with a stroke localizing to the left hemisphere.  On 11/6 she had a TTE which showed LV EF 55 to 60% with normal LV function.  MRI of her head was without any evidence of hemodynamically significant stenosis.  Carotid ultrasound shows a 50 to 69% stenosis on her right side.  Left side shows 70 to 99% stenosis.  In looking back through her history, she has been followed by [facility] cardiology for her carotid stenosis.  Her most recent study at [facility] a year ago showed her left carotid artery approaching 70% stenosis.  Speaking with Ms. [name] she acknowledges knowing the stenosis to her left carotid artery and her right carotid artery.  She denies any history of stroke or TIAs in the past.  She denies any vision difficulties headaches nausea vomiting dizziness or vertigo.  She is alert oriented x4 today and answers all of my questions appropriately.  We had a long discussion today regarding undergoing carotid angiogram with possible angioplasty  and stent placement.  She states in the in the past she was told she may have to have some type of surgery to her left carotid artery.  She states she thought she would have to have her neck cut open.  I discussed in detail an endovascular approach to angioplasty with stent placement of her left carotid artery.  She verbalized her understanding of the procedure.  She would like to proceed with the procedure as soon as we feel it is safe to do.  Today in a discussion with Dr. Leotis Pain MD vascular surgeon, due to her CKD and creatinine of 1.4 today, most likely from all the previous testing he suggests we wait 1 week to see if her creatinine improves prior to attempting carotid angiograms.  We will continue to monitor and follow the patient's kidney status and schedule a procedure if and when appropriate.  Current Facility-Administered Medications  Medication Dose Route Frequency Provider Last Rate Last Admin   0.9 %  sodium chloride infusion (Manually program via Guardrails IV Fluids)   Intravenous Once Sharion Settler, NP       acetaminophen (TYLENOL) tablet 650 mg  650 mg Oral Q6H PRN Fritzi Mandes, MD   650 mg at 10/27/22 [id number]   Or   acetaminophen (TYLENOL) suppository 650 mg  650 mg Rectal Q6H PRN Fritzi Mandes, MD       albuterol (PROVENTIL) (2.5 MG/3ML) 0.083% nebulizer solution 3 mL  3 mL Inhalation Q4H PRN Fritzi Mandes, MD  aspirin EC tablet 81 mg  81 mg Oral Daily Enzo Bi, MD   81 mg at 10/28/22 0914   bisacodyl (DULCOLAX) suppository 10 mg  10 mg Rectal Daily PRN Fritzi Mandes, MD       cholecalciferol (VITAMIN D3) 25 MCG (1000 UNIT) tablet 2,000 Units  2,000 Units Oral Daily Fritzi Mandes, MD   2,000 Units at 10/28/22 0914   cyanocobalamin (VITAMIN B12) tablet 500 mcg  500 mcg Oral Daily Fritzi Mandes, MD   500 mcg at 10/28/22 0914   enoxaparin (LOVENOX) injection 40 mg  40 mg Subcutaneous Q24H Enzo Bi, MD   40 mg at 10/27/22 2112   feeding supplement (ENSURE ENLIVE / ENSURE PLUS) liquid 237  mL  237 mL Oral TID BM Enzo Bi, MD   237 mL at [id number]   folic acid (FOLVITE) tablet 1 mg  1,000 mcg Oral Daily Fritzi Mandes, MD   1 mg at 10/28/22 0914   gabapentin (NEURONTIN) capsule 200 mg  200 mg Oral TID Enzo Bi, MD   200 mg at 10/28/22 0914   hydrALAZINE (APRESOLINE) injection 10 mg  10 mg Intravenous Q6H PRN Fritzi Mandes, MD       insulin aspart (novoLOG) injection 0-15 Units  0-15 Units Subcutaneous TID WC Fritzi Mandes, MD   8 Units at 10/28/22 1143   insulin glargine-yfgn (SEMGLEE) injection 10 Units  10 Units Subcutaneous Daily Fritzi Mandes, MD   10 Units at 10/28/22 0913   levofloxacin (LEVAQUIN) tablet 250 mg  250 mg Oral Daily Dorothe Pea, RPH   250 mg at 10/28/22 1143   mometasone-formoterol (DULERA) 200-5 MCG/ACT inhaler 2 puff  2 puff Inhalation BID Fritzi Mandes, MD   2 puff at 10/28/22 0914   ondansetron (ZOFRAN) tablet 4 mg  4 mg Oral Q6H PRN Fritzi Mandes, MD       Or   ondansetron [facility]) injection 4 mg  4 mg Intravenous Q6H PRN Fritzi Mandes, MD   4 mg at 10/20/22 0751   Oral care mouth rinse  15 mL Mouth Rinse PRN Fritzi Mandes, MD       oxyCODONE (Oxy IR/ROXICODONE) immediate release tablet 5-10 mg  5-10 mg Oral Q6H PRN Enzo Bi, MD   10 mg at 10/28/22 1143   pantoprazole (PROTONIX) EC tablet 40 mg  40 mg Oral BID Enzo Bi, MD   40 mg at 10/28/22 0914   polyethylene glycol (MIRALAX / GLYCOLAX) packet 17 g  17 g Oral Daily PRN Fritzi Mandes, MD       predniSONE (DELTASONE) tablet 40 mg  40 mg Oral Q breakfast Enzo Bi, MD   40 mg at 10/28/22 0914   senna (SENOKOT) tablet 8.6 mg  1 tablet Oral BID Fritzi Mandes, MD   8.6 mg at 10/28/22 0914   simvastatin (ZOCOR) tablet 20 mg  20 mg Oral QHS Fritzi Mandes, MD   20 mg at 10/27/22 2112   traZODone (DESYREL) tablet 25 mg  25 mg Oral QHS PRN Fritzi Mandes, MD   25 mg at 10/25/22 2131    Past Medical History:  Diagnosis Date   Arthritis    CAD (coronary artery disease)    Chronic kidney disease    COPD (chronic obstructive  pulmonary disease) ([place])    Diabetes mellitus without complication ([place])    Hyperlipidemia    Hypertension    Restless legs syndrome (RLS)     Past Surgical History:  Procedure Laterality Date   ABDOMINAL HYSTERECTOMY  APPENDECTOMY     CATARACT EXTRACTION Bilateral 2019   COLONOSCOPY N/A 10/22/2022   Procedure: COLONOSCOPY;  Surgeon: Lucilla Lame, MD;  Location: [facility] ENDOSCOPY;  Service: Endoscopy;  Laterality: N/A;   ESOPHAGOGASTRODUODENOSCOPY (EGD) WITH PROPOFOL N/A 10/18/2022   Procedure: ESOPHAGOGASTRODUODENOSCOPY (EGD) WITH PROPOFOL;  Surgeon: Lin Landsman, MD;  Location: [facility] ENDOSCOPY;  Service: Gastroenterology;  Laterality: N/A;   ESOPHAGOGASTRODUODENOSCOPY (EGD) WITH PROPOFOL N/A 10/22/2022   Procedure: ESOPHAGOGASTRODUODENOSCOPY (EGD) WITH PROPOFOL;  Surgeon: Lucilla Lame, MD;  Location: [facility] ENDOSCOPY;  Service: Endoscopy;  Laterality: N/A;   TONSILLECTOMY      Social History Social History   Tobacco Use   Smoking status: Former    Packs/day: 1.00    Years: 50.00    Total pack years: 50.00    Types: Cigarettes    Quit date: 09/13/2017    Years since quitting: 5.1   Smokeless tobacco: Never  Vaping Use   Vaping Use: Never used  Substance Use Topics   Alcohol use: Never   Drug use: Never    Family History Family History  Problem Relation Age of Onset   Hypertension Mother    Heart disease Mother    Diabetes Mother    Hypertension Father    Heart disease Father     Allergies  Allergen Reactions   Amoxicillin Anaphylaxis   Lisinopril Anaphylaxis   Tramadol Nausea Only   Gabapentin Other (See Comments)    Hands jerking   Colchicine Nausea Only     REVIEW OF SYSTEMS (Negative unless checked)  Constitutional: [] Weight loss  [] Fever  [] Chills Cardiac: [] Chest pain   [] Chest pressure   [] Palpitations   [] Shortness of breath when laying flat   [] Shortness of breath at rest   [] Shortness of breath with exertion. Vascular:  [] Pain in legs with  walking   [] Pain in legs at rest   [] Pain in legs when laying flat   [] Claudication   [] Pain in feet when walking  [] Pain in feet at rest  [] Pain in feet when laying flat   [] History of DVT   [] Phlebitis   [] Swelling in legs   [] Varicose veins   [] Non-healing ulcers Pulmonary:   [] Uses home oxygen   [] Productive cough   [] Hemoptysis   [] Wheeze  [] COPD   [] Asthma Neurologic:  [] Dizziness  [] Blackouts   [] Seizures   [] History of stroke   [] History of TIA  [] Aphasia   [] Temporary blindness   [] Dysphagia   [x] Weakness or numbness in arms   [] Weakness or numbness in legs Musculoskeletal:  [] Arthritis   [] Joint swelling   [] Joint pain   [] Low back pain Hematologic:  [] Easy bruising  [] Easy bleeding   [] Hypercoagulable state   [x] Anemic  [] Hepatitis Gastrointestinal:  [x] Blood in stool   [] Vomiting blood  [x] Gastroesophageal reflux/heartburn   [] Difficulty swallowing. Genitourinary:  [x] Chronic kidney disease   [] Difficult urination  [] Frequent urination  [] Burning with urination   [] Blood in urine Skin:  [] Rashes   [] Ulcers   [] Wounds Psychological:  [] History of anxiety   []  History of major depression.  Physical Examination  Vitals:   10/27/22 1723 10/27/22 1930 10/28/22 0409 10/28/22 0734  BP: (!) 121/57 (!) 133/59 (!) 134/53 (!) 142/48  Pulse: 97 89 81 79  Resp: 18 16 16 16   Temp: 98.4 F (36.9 C) 98.6 F (37 C) 98 F (36.7 C) 98 F (36.7 C)  TempSrc: Oral Oral Oral Oral  SpO2: 94% 96% 96% 98%  Weight:      Height:  Body mass index is 26.34 kg/m. Gen:  WD/WN, NAD Head: /AT, No temporalis wasting. Prominent temp pulse not noted. Ear/Nose/Throat: Hearing grossly intact, nares w/o erythema or drainage, oropharynx w/o Erythema/Exudate Eyes: Sclera non-icteric, conjunctiva clear Neck: Trachea midline.  No JVD.  Pulmonary:  Good air movement, respirations not labored, equal bilaterally.  Cardiac: RRR, normal S1, S2. Vascular: Palpable pulses throughout.  Right lower extremity pain  to touch. Vessel Right Left  Radial Palpable Palpable  Ulnar Palpable Palpable  Brachial Palpable Palpable  Carotid Palpable, without bruit Palpable, without bruit  Aorta Not palpable N/A  Femoral Palpable Palpable  Popliteal Palpable Palpable  PT Palpable Palpable  DP Palpable Palpable   Gastrointestinal: soft, non-tender/non-distended. No guarding/reflex.  Musculoskeletal: M/S 5/5 throughout.  Extremities without ischemic changes.  No deformity or atrophy. No edema. Neurologic: Sensation grossly intact in extremities.  Symmetrical.  Speech is fluent. Motor exam as listed above. Psychiatric: Judgment intact, Mood & affect appropriate for pt's clinical situation. Dermatologic: No rashes or ulcers noted.  No cellulitis or open wounds. Lymph : No Cervical, Axillary, or Inguinal lymphadenopathy.    CBC Lab Results  Component Value Date   WBC 18.0 (H) 10/28/2022   HGB 8.7 (L) 10/28/2022   HCT 27.4 (L) 10/28/2022   MCV 88.4 10/28/2022   PLT 348 10/28/2022    BMET    Component Value Date/Time   NA 137 10/28/2022 0420   K 3.7 10/28/2022 0420   CL 90 (L) 10/28/2022 0420   CO2 33 (H) 10/28/2022 0420   GLUCOSE 229 (H) 10/28/2022 0420   BUN 44 (H) 10/28/2022 0420   CREATININE 1.40 (H) 10/28/2022 0420   CALCIUM 8.3 (L) 10/28/2022 0420   GFRNONAA 38 (L) 10/28/2022 0420   Estimated Creatinine Clearance: 28.9 mL/min (A) (by C-G formula based on SCr of 1.4 mg/dL (H)).  COAG Lab Results  Component Value Date   INR 1.0 10/17/2022    Radiology US Carotid Bilateral  Result Date: 10/27/2022 CLINICAL DATA:  81 year old female with a history of stroke EXAM: BILATERAL CAROTID DUPLEX ULTRASOUND TECHNIQUE: Wallace Cullens scale imaging, color Doppler and duplex ultrasound were performed of bilateral carotid and vertebral arteries in the neck. COMPARISON:  None Available. FINDINGS: Criteria: Quantification of carotid stenosis is based on velocity parameters that correlate the residual internal  carotid diameter with NASCET-based stenosis levels, using the diameter of the distal internal carotid lumen as the denominator for stenosis measurement. The following velocity measurements were obtained: RIGHT ICA:  Systolic 149 cm/sec, Diastolic 32 cm/sec CCA:  71 cm/sec SYSTOLIC ICA/CCA RATIO:  2.1 ECA:  154 cm/sec LEFT ICA:  Systolic 491 cm/sec, Diastolic 175 cm/sec CCA:  34 cm/sec SYSTOLIC ICA/CCA RATIO:  14.4 ECA:  155 cm/sec Right Brachial SBP: Not acquired Left Brachial SBP: Not acquired RIGHT CAROTID ARTERY: No significant calcifications of the right common carotid artery. Intermediate waveform maintained. Moderate heterogeneous and partially calcified plaque at the right carotid bifurcation. Some shadowing present. Low resistance waveform of the right ICA. No significant tortuosity. RIGHT VERTEBRAL ARTERY: Antegrade flow with low resistance waveform. LEFT CAROTID ARTERY: No significant calcifications of the left common carotid artery. Intermediate waveform maintained. Moderate heterogeneous and partially calcified plaque at the left carotid bifurcation. Shadowing present. Low resistance waveform of the left ICA. No significant tortuosity. LEFT VERTEBRAL ARTERY:  Antegrade flow with low resistance waveform. 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 Left: Heterogeneous and partially calcified plaque at the  left carotid bifurcation contributes to 70%-99% stenosis by established duplex criteria. Signed, Dulcy Fanny. Nadene Rubins, RPVI [facility] Electronically Signed   By: Corrie Mckusick D.O.   On: 10/27/2022 14:23   MR BRAIN WO CONTRAST  Result Date: 10/26/2022 CLINICAL DATA:  Neuro deficit, acute, stroke suspected. EXAM: MRI HEAD WITHOUT CONTRAST MRA HEAD WITHOUT CONTRAST TECHNIQUE: Multiplanar, multi-echo pulse sequences of the  brain and surrounding structures were acquired without intravenous contrast. Angiographic images of the Circle of Willis were acquired using MRA technique without intravenous contrast. COMPARISON:  Head CT October 25, 2022. FINDINGS: MRI HEAD FINDINGS The study is degraded by motion. Brain: Scattered foci of restricted diffusion are seen in the left centrum semiovale, corona radiata and basal ganglia region in a watershed type distribution. No evidence of hemorrhagic transformation. No hydrocephalus, extra-axial collection or mass lesion. Scattered and confluent foci of T2 hyperintensity are seen within the white matter of the cerebral hemispheres, nonspecific, most likely related to chronic small vessel ischemia. Vascular: Normal flow voids. Skull and upper cervical spine: Grossly unremarkable. Sinuses/Orbits: No acute or significant finding. Other: Right temporomandibular joint effusion. MRA HEAD FINDINGS The study is degraded by motion. Anterior circulation: Asymmetry in caliber of the visualized cervical and intracranial internal carotid arteries, with smaller caliber of the left ICA compared to the right. Luminal irregularity is noted in the cavernous segment bilaterally, suggestive of intracranial atherosclerotic disease without hemodynamically significant stenosis. Flow related enhancement is maintained. Flow related enhancement is also preserved in the bilateral ACA and MCA vascular trees. Diffuse luminal irregularity related to motion artifacts limits evaluation for focal stenosis. Posterior circulation: Normal caliber flow related enhancement of the bilateral intracranial vertebral arteries and basilar artery. The bilateral posterior cerebral arteries are patent. Diffuse luminal irregularity related to motion artifacts limits evaluation for focal stenosis. Anatomic variants: None significant. IMPRESSION: 1. Acute/subacute infarcts in the left centrum semiovale, corona radiata and basal ganglia region in a  watershed type distribution. 2. Moderate chronic microvascular ischemic changes of the white matter. 3. Motion degraded MR angiogram of the head without evidence of hemodynamically significant stenosis. Electronically Signed   By: Pedro Earls M.D.   On: 10/26/2022 15:02   MR ANGIO HEAD WO CONTRAST  Result Date: 10/26/2022 CLINICAL DATA:  Neuro deficit, acute, stroke suspected. EXAM: MRI HEAD WITHOUT CONTRAST MRA HEAD WITHOUT CONTRAST TECHNIQUE: Multiplanar, multi-echo pulse sequences of the brain and surrounding structures were acquired without intravenous contrast. Angiographic images of the Circle of Willis were acquired using MRA technique without intravenous contrast. COMPARISON:  Head CT October 25, 2022. FINDINGS: MRI HEAD FINDINGS The study is degraded by motion. Brain: Scattered foci of restricted diffusion are seen in the left centrum semiovale, corona radiata and basal ganglia region in a watershed type distribution. No evidence of hemorrhagic transformation. No hydrocephalus, extra-axial collection or mass lesion. Scattered and confluent foci of T2 hyperintensity are seen within the white matter of the cerebral hemispheres, nonspecific, most likely related to chronic small vessel ischemia. Vascular: Normal flow voids. Skull and upper cervical spine: Grossly unremarkable. Sinuses/Orbits: No acute or significant finding. Other: Right temporomandibular joint effusion. MRA HEAD FINDINGS The study is degraded by motion. Anterior circulation: Asymmetry in caliber of the visualized cervical and intracranial internal carotid arteries, with smaller caliber of the left ICA compared to the right. Luminal irregularity is noted in the cavernous segment bilaterally, suggestive of intracranial atherosclerotic disease without hemodynamically significant stenosis. Flow related enhancement is maintained. Flow  related enhancement is also preserved in the bilateral ACA and MCA vascular trees. Diffuse  luminal irregularity related to motion artifacts limits evaluation for focal stenosis. Posterior circulation: Normal caliber flow related enhancement of the bilateral intracranial vertebral arteries and basilar artery. The bilateral posterior cerebral arteries are patent. Diffuse luminal irregularity related to motion artifacts limits evaluation for focal stenosis. Anatomic variants: None significant. IMPRESSION: 1. 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 2. Moderate chronic microvascular ischemic changes of the white matter. 3. Motion degraded MR angiogram of the head without evidence of hemodynamically significant stenosis. Electronically Signed   By: Pedro Earls M.D.   On: 10/26/2022 15:02   CT HEAD WO CONTRAST (5MM)  Result Date: 10/25/2022 CLINICAL DATA:  right hand weakness EXAM: CT HEAD WITHOUT CONTRAST TECHNIQUE: Contiguous axial images were obtained from the base of the skull through the vertex without intravenous contrast. RADIATION DOSE REDUCTION: This exam was performed according to the departmental dose-optimization program which includes automated exposure control, adjustment of the mA and/or kV according to patient size and/or use of iterative reconstruction technique. COMPARISON:  None Available. FINDINGS: Brain: Small age indeterminate infarct in the anterior limb the left internal capsule/caudate. No acute hemorrhage, mass lesion, midline shift or hydrocephalus. Patchy white matter hypodensities, nonspecific but compatible with chronic microvascular ischemic disease. Vascular: No hyperdense vessel identified. Calcific atherosclerosis. Skull: No acute fracture. Sinuses/Orbits: Clear sinuses.  Knee surgery findings. IMPRESSION: 1. Small age indeterminate infarct in the anterior limb the left internal capsule/caudate, potentially acute. Recommend MRI to further evaluate. 2. Chronic  microvascular ischemic disease. These results will be called to the ordering clinician or representative by the Radiologist Assistant, and communication documented in the PACS or [employer]. Electronically Signed   By: Margaretha Sheffield M.D.   On: 10/25/2022 18:02   DG Chest Port 1 View  Result Date: 10/24/2022 CLINICAL DATA:  Hypoxia EXAM: PORTABLE CHEST 1 VIEW COMPARISON:  10/19/2022 FINDINGS: Transverse diameter of heart is increased. Thoracic aorta is tortuous and ectatic. There is improvement in aeration in right upper lung field. There is residual prominence of interstitial markings in left parahilar region and both lower lung fields. No new focal pulmonary consolidation is seen. Lateral CP angles are indistinct suggesting small effusions. There is no pneumothorax. IMPRESSION: Cardiomegaly. There is slight prominence of interstitial markings in left parahilar region and both lower lung fields suggesting interstitial edema or interstitial pneumonia. Possible small bilateral pleural effusions. Electronically Signed   By: Elmer Picker M.D.   On: 10/24/2022 14:28   ECHOCARDIOGRAM COMPLETE  Result Date: 10/19/2022    ECHOCARDIOGRAM REPORT   Patient Name:   OPALINE PARMELY Date of Exam: 10/19/2022 Medical Rec #:  [id number]     Height:       62.0 in Accession #:    [id number]    Weight:       144.0 lb Date of Birth:  09-25-1941     BSA:          1.663 m Patient Age:    51 years      BP:           138/55 mmHg Patient Gender: F             HR:           81 bpm. Exam Location:  [facility] Procedure: 2D Echo, Color Doppler, Cardiac Doppler and 3D Echo Indications:     Acute  Respiratory Distress R06.03  History:         Patient has no prior history of Echocardiogram examinations.                  CAD, COPD; Risk Factors:Hypertension, Dyslipidemia and                  Diabetes.  Sonographer:     Mikki Santee RDCS Referring Phys:  [place] Diagnosing Phys: Kathlyn Sacramento MD IMPRESSIONS  1. Left  ventricular ejection fraction, by estimation, is 55 to 60%. The left ventricle has normal function. The left ventricle has no regional wall motion abnormalities. There is mild left ventricular hypertrophy. Left ventricular diastolic parameters were normal.  2. Right ventricular systolic function is normal. The right ventricular size is normal. Tricuspid regurgitation signal is inadequate for assessing PA pressure.  3. Left atrial size was mildly dilated.  4. The mitral valve is normal in structure. Mild mitral valve regurgitation. No evidence of mitral stenosis.  5. The aortic valve is normal in structure. Aortic valve regurgitation is not visualized. Aortic valve sclerosis is present, with no evidence of aortic valve stenosis.  6. The inferior vena cava is dilated in size with >50% respiratory variability, suggesting right atrial pressure of 8 mmHg. FINDINGS  Left Ventricle: Left ventricular ejection fraction, by estimation, is 55 to 60%. The left ventricle has normal function. The left ventricle has no regional wall motion abnormalities. The left ventricular internal cavity size was normal in size. There is  mild left ventricular hypertrophy. Left ventricular diastolic parameters were normal. Right Ventricle: The right ventricular size is normal. No increase in right ventricular wall thickness. Right ventricular systolic function is normal. Tricuspid regurgitation signal is inadequate for assessing PA pressure. Left Atrium: Left atrial size was mildly dilated. Right Atrium: Right atrial size was normal in size. Pericardium: There is no evidence of pericardial effusion. Mitral Valve: The mitral valve is normal in structure. Mild mitral valve regurgitation. No evidence of mitral valve stenosis. Tricuspid Valve: The tricuspid valve is normal in structure. Tricuspid valve regurgitation is trivial. No evidence of tricuspid stenosis. Aortic Valve: The aortic valve is normal in structure. Aortic valve regurgitation is  not visualized. Aortic valve sclerosis is present, with no evidence of aortic valve stenosis. Pulmonic Valve: The pulmonic valve was normal in structure. Pulmonic valve regurgitation is not visualized. No evidence of pulmonic stenosis. Aorta: The aortic root is normal in size and structure. Venous: The inferior vena cava is dilated in size with greater than 50% respiratory variability, suggesting right atrial pressure of 8 mmHg. IAS/Shunts: No atrial level shunt detected by color flow Doppler.  LEFT VENTRICLE PLAX 2D LVIDd:         4.10 cm   Diastology LVIDs:         2.80 cm   LV e' medial:    8.81 cm/s LV PW:         1.20 cm   LV E/e' medial:  14.3 LV IVS:        1.20 cm   LV e' lateral:   8.49 cm/s LVOT diam:     2.10 cm   LV E/e' lateral: 14.8 LV SV:         84 LV SV Index:   51 LVOT Area:     3.46 cm                           3D  Volume EF:                          3D EF:        65 %                          LV EDV:       137 ml                          LV ESV:       47 ml                          LV SV:        90 ml RIGHT VENTRICLE RV Basal diam:  3.60 cm RV Mid diam:    3.40 cm RV S prime:     14.30 cm/s TAPSE (M-mode): 2.5 cm LEFT ATRIUM             Index        RIGHT ATRIUM           Index LA diam:        3.40 cm 2.05 cm/m   RA Area:     13.20 cm LA Vol (A2C):   50.8 ml 30.55 ml/m  RA Volume:   29.20 ml  17.56 ml/m LA Vol (A4C):   54.4 ml 32.72 ml/m LA Biplane Vol: 55.7 ml 33.50 ml/m  AORTIC VALVE LVOT Vmax:   114.00 cm/s LVOT Vmean:  76.800 cm/s LVOT VTI:    0.243 m  AORTA Ao Root diam: 2.50 cm MITRAL VALVE MV Area (PHT): 3.89 cm     SHUNTS MV Decel Time: 195 msec     Systemic VTI:  0.24 m MV E velocity: 126.00 cm/s  Systemic Diam: 2.10 cm MV A velocity: 118.00 cm/s MV E/A ratio:  1.07 Lorine Bears MD Electronically signed by Lorine Bears MD Signature Date/Time: 10/19/2022/2:56:53 PM    Final    CT PANCREAS ABD W/WO  Result Date: 10/19/2022 CLINICAL DATA:  Abdominal pain. Concern for  pancreatic mass on endoscopic exam. EXAM: CT ABDOMEN WITHOUT AND WITH CONTRAST TECHNIQUE: Multidetector CT imaging of the abdomen was performed following the standard protocol before and following the bolus administration of intravenous contrast. RADIATION DOSE REDUCTION: This exam was performed according to the departmental dose-optimization program which includes automated exposure control, adjustment of the mA and/or kV according to patient size and/or use of iterative reconstruction technique. CONTRAST:  79mL OMNIPAQUE IOHEXOL 350 MG/ML SOLN COMPARISON:  None Available. FINDINGS: Lower chest:  Mild bibasilar atelectasis. Hepatobiliary: No focal hepatic lesion. No biliary duct dilatation. Gallbladder normal. Common bile duct normal. Pancreas: No pancreatic ductal dilatation. Pancreas is mildly atrophic. No mass lesion identified within the pancreas. No peripancreatic adenopathy. Spleen: Normal spleen. Adrenals/urinary tract: Bilobed simple fluid attenuation cystic lesion of the mid LEFT kidney measures 3.4 cm. There is a central thin calcified cyst wall between the bilobed cysts (image 20/2). Two nonobstructing calculi LEFT kidney. No ureterolithiasis proximally. Stomach/Bowel: Stomach and duodenum appear normal. Limited view of the small bowelcolon Unremarkable. Vascular/Lymphatic: Abdominal aorta is normal caliber with atherosclerotic calcification. There is no retroperitoneal or periportal lymphadenopathy. No pelvic lymphadenopathy. Musculoskeletal: No aggressive osseous lesion IMPRESSION: 1. No pancreatic mass identified. No pancreatic duct dilatation or common bile duct dilatation. 2. Mild pancreatic atrophy consistent with patient's age. 3. Normal  liver without duct dilatation. 4. LEFT Bosniak II benign renal cyst measuring 3.4 cm. No follow-up imaging is recommended. JACR 2018 Feb; 264-273, Management of the Incidental Renal Mass on CT, [employer] 2021; [id number], Bosniak Classification of Cystic Renal  Masses, Version 2019. Electronically Signed   By: Suzy Bouchard M.D.   On: 10/19/2022 10:49   DG Chest Port 1 View  Result Date: 10/19/2022 CLINICAL DATA:  Shortness of breath. EXAM: PORTABLE CHEST 1 VIEW COMPARISON:  Chest radiographs 06/04/2020 FINDINGS: The cardiac silhouette is upper limits of normal in size. Aortic atherosclerosis is noted. There are increased interstitial densities throughout both lungs. No sizable pleural effusion or pneumothorax is identified. No acute osseous abnormality is seen. IMPRESSION: Bilateral interstitial densities which may reflect edema or viral/atypical infection. Electronically Signed   By: Logan Bores M.D.   On: 10/19/2022 08:44      Assessment/Plan 1.  Stroke/carotid stenosis.  We will plan for carotid angiogram with possible angioplasty and stent placement in 1 week with improvement of patient's CKD and creatinine.  2.  Right lower extremity pain.  Patient was extremely painful to slight touch today on examination of her right lower extremity.  We suggest duplex ultrasound of her lower extremities, right greater than left.  Evaluate PAD and rule out thrombus.  3. Continue BP control as already ordered, agree with neurology plan for SBP goals.  These interventions/medications have been reviewed and there are no changes at this time. Primary Team to manage.    Plan of care discussed with Dr Leotis Pain and he is in agreement with plan noted above.   Family Communication:  Total Time:75 I spent 75 minutes in this encounter including personally reviewing extensive medical records, personally reviewing imaging studies and compared to prior scans, counseling the patient, placing orders, coordinating care and performing appropriate documentation  Thank you for allowing [place] to participate in the care of this patient.   Drema Pry, NP [facility] [phone] (Office Phone) [phone] (Office Fax) [phone]  (Pager)  10/28/2022 12:19 PM  Staff may message me via secure chat in [place]  but this may not receive immediate response,  please page for urgent matters!  Dictation software was used to generate the above note. Typos may occur and escape review, as with typed/written notes. Any error is purely unintentional.  Please contact me directly for clarity if needed.

## 2022-10-28 NOTE — Hospital Course (Addendum)
Memorie Yokoyama is a 81 y.o. 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  EGD was performed on 11/5, was found to have bleeding friable duodenal mucosa, treated with cauterization.  Patient developed acute respiratory failure with pulmonary edema after fluid resuscitation and blood transfusion.  Was treated with IV Lasix, echocardiogram showed normal heart function.   Patient developed acute stroke on 11/12, MRI showed acute/subacute infarcts in the left centrum semiovale, corona radiata and basal ganglia region in a watershed type distribution.  Neurology recommend aspirin 81 mg daily, avoid hypotension.  Carotid ultrasound showed 70 to 99% stenosis in the right internal carotid arteries.  Vascular surgery consulted on 11/15, scheduled intervention on Monday. Patient also treated steroids for possible acute gout with bilateral foot pain and ankle pain.

## 2022-10-28 NOTE — Progress Notes (Signed)
       CROSS COVER NOTE  NAME: Kelly Rowland MRN: [id number] DOB : 01-24-1941 ATTENDING PHYSICIAN: Marrion Coy, MD    Date of Service   10/28/2022   HPI/Events of Note   Notified of elevated CBG--> 411.  Interventions   Assessment/Plan:  10U Novolog     This document was prepared using Conservation officer, historic buildings and may include unintentional dictation errors.  Bishop Limbo DNP, MBA, FNP-BC Nurse Practitioner [facility] Pager [phone]

## 2022-10-28 NOTE — H&P (View-Only) (Signed)
[facility] Vascular Consult Note  MRN : [id number]  Kelly Rowland is a 81 y.o. (1941/01/20) female who presents with chief complaint of  Chief Complaint  Patient presents with   Melena  .   Consulting Physician: Dr. Sharen Hones MD Reason for consult: Stroke/ Carotid Stenosis History of Present Illness: Kelly Rowland is a 81 year old female with a past medical history of CAD, CT Kd, arthritis, COPD, DM, HLD, HTN, and RLS who was admitted on 11 4 for management of a GI bleed which has now recent resolved.  While waiting for SNF rehab placement this past Monday the patient told staff that she felt she had a stroke because her right hand had become weak and her right leg had become very painful and unable to move well.  An MRI of her brain revealed acute/subacute infarcts to the left side in a watershed type distribution.  MRI also showed moderate microvascular ischemic changes of her white matter.  On exam findings are most consistent with a stroke localizing to the left hemisphere.  On 11/6 she had a TTE which showed LV EF 55 to 60% with normal LV function.  MRI of her head was without any evidence of hemodynamically significant stenosis.  Carotid ultrasound shows a 50 to 69% stenosis on her right side.  Left side shows 70 to 99% stenosis.  In looking back through her history, she has been followed by [facility] cardiology for her carotid stenosis.  Her most recent study at [facility] a year ago showed her left carotid artery approaching 70% stenosis.  Speaking with Ms. [name] she acknowledges knowing the stenosis to her left carotid artery and her right carotid artery.  She denies any history of stroke or TIAs in the past.  She denies any vision difficulties headaches nausea vomiting dizziness or vertigo.  She is alert oriented x4 today and answers all of my questions appropriately.  We had a long discussion today regarding undergoing carotid angiogram with possible angioplasty  and stent placement.  She states in the in the past she was told she may have to have some type of surgery to her left carotid artery.  She states she thought she would have to have her neck cut open.  I discussed in detail an endovascular approach to angioplasty with stent placement of her left carotid artery.  She verbalized her understanding of the procedure.  She would like to proceed with the procedure as soon as we feel it is safe to do.  Today in a discussion with Dr. Leotis Pain MD vascular surgeon, due to her CKD and creatinine of 1.4 today, most likely from all the previous testing he suggests we wait 1 week to see if her creatinine improves prior to attempting carotid angiograms.  We will continue to monitor and follow the patient's kidney status and schedule a procedure if and when appropriate.  Current Facility-Administered Medications  Medication Dose Route Frequency Provider Last Rate Last Admin   0.9 %  sodium chloride infusion (Manually program via Guardrails IV Fluids)   Intravenous Once Sharion Settler, NP       acetaminophen (TYLENOL) tablet 650 mg  650 mg Oral Q6H PRN Fritzi Mandes, MD   650 mg at 10/27/22 [id number]   Or   acetaminophen (TYLENOL) suppository 650 mg  650 mg Rectal Q6H PRN Fritzi Mandes, MD       albuterol (PROVENTIL) (2.5 MG/3ML) 0.083% nebulizer solution 3 mL  3 mL Inhalation Q4H PRN Fritzi Mandes, MD  aspirin EC tablet 81 mg  81 mg Oral Daily Enzo Bi, MD   81 mg at 10/28/22 0914   bisacodyl (DULCOLAX) suppository 10 mg  10 mg Rectal Daily PRN Fritzi Mandes, MD       cholecalciferol (VITAMIN D3) 25 MCG (1000 UNIT) tablet 2,000 Units  2,000 Units Oral Daily Fritzi Mandes, MD   2,000 Units at 10/28/22 0914   cyanocobalamin (VITAMIN B12) tablet 500 mcg  500 mcg Oral Daily Fritzi Mandes, MD   500 mcg at 10/28/22 0914   enoxaparin (LOVENOX) injection 40 mg  40 mg Subcutaneous Q24H Enzo Bi, MD   40 mg at 10/27/22 2112   feeding supplement (ENSURE ENLIVE / ENSURE PLUS) liquid 237  mL  237 mL Oral TID BM Enzo Bi, MD   237 mL at [id number]   folic acid (FOLVITE) tablet 1 mg  1,000 mcg Oral Daily Fritzi Mandes, MD   1 mg at 10/28/22 0914   gabapentin (NEURONTIN) capsule 200 mg  200 mg Oral TID Enzo Bi, MD   200 mg at 10/28/22 0914   hydrALAZINE (APRESOLINE) injection 10 mg  10 mg Intravenous Q6H PRN Fritzi Mandes, MD       insulin aspart (novoLOG) injection 0-15 Units  0-15 Units Subcutaneous TID WC Fritzi Mandes, MD   8 Units at 10/28/22 1143   insulin glargine-yfgn (SEMGLEE) injection 10 Units  10 Units Subcutaneous Daily Fritzi Mandes, MD   10 Units at 10/28/22 0913   levofloxacin (LEVAQUIN) tablet 250 mg  250 mg Oral Daily Dorothe Pea, RPH   250 mg at 10/28/22 1143   mometasone-formoterol (DULERA) 200-5 MCG/ACT inhaler 2 puff  2 puff Inhalation BID Fritzi Mandes, MD   2 puff at 10/28/22 0914   ondansetron (ZOFRAN) tablet 4 mg  4 mg Oral Q6H PRN Fritzi Mandes, MD       Or   ondansetron [facility]) injection 4 mg  4 mg Intravenous Q6H PRN Fritzi Mandes, MD   4 mg at 10/20/22 0751   Oral care mouth rinse  15 mL Mouth Rinse PRN Fritzi Mandes, MD       oxyCODONE (Oxy IR/ROXICODONE) immediate release tablet 5-10 mg  5-10 mg Oral Q6H PRN Enzo Bi, MD   10 mg at 10/28/22 1143   pantoprazole (PROTONIX) EC tablet 40 mg  40 mg Oral BID Enzo Bi, MD   40 mg at 10/28/22 0914   polyethylene glycol (MIRALAX / GLYCOLAX) packet 17 g  17 g Oral Daily PRN Fritzi Mandes, MD       predniSONE (DELTASONE) tablet 40 mg  40 mg Oral Q breakfast Enzo Bi, MD   40 mg at 10/28/22 0914   senna (SENOKOT) tablet 8.6 mg  1 tablet Oral BID Fritzi Mandes, MD   8.6 mg at 10/28/22 0914   simvastatin (ZOCOR) tablet 20 mg  20 mg Oral QHS Fritzi Mandes, MD   20 mg at 10/27/22 2112   traZODone (DESYREL) tablet 25 mg  25 mg Oral QHS PRN Fritzi Mandes, MD   25 mg at 10/25/22 2131    Past Medical History:  Diagnosis Date   Arthritis    CAD (coronary artery disease)    Chronic kidney disease    COPD (chronic obstructive  pulmonary disease) ([place])    Diabetes mellitus without complication ([place])    Hyperlipidemia    Hypertension    Restless legs syndrome (RLS)     Past Surgical History:  Procedure Laterality Date   ABDOMINAL HYSTERECTOMY  APPENDECTOMY     CATARACT EXTRACTION Bilateral 2019   COLONOSCOPY N/A 10/22/2022   Procedure: COLONOSCOPY;  Surgeon: Lucilla Lame, MD;  Location: [facility] ENDOSCOPY;  Service: Endoscopy;  Laterality: N/A;   ESOPHAGOGASTRODUODENOSCOPY (EGD) WITH PROPOFOL N/A 10/18/2022   Procedure: ESOPHAGOGASTRODUODENOSCOPY (EGD) WITH PROPOFOL;  Surgeon: Lin Landsman, MD;  Location: [facility] ENDOSCOPY;  Service: Gastroenterology;  Laterality: N/A;   ESOPHAGOGASTRODUODENOSCOPY (EGD) WITH PROPOFOL N/A 10/22/2022   Procedure: ESOPHAGOGASTRODUODENOSCOPY (EGD) WITH PROPOFOL;  Surgeon: Lucilla Lame, MD;  Location: [facility] ENDOSCOPY;  Service: Endoscopy;  Laterality: N/A;   TONSILLECTOMY      Social History Social History   Tobacco Use   Smoking status: Former    Packs/day: 1.00    Years: 50.00    Total pack years: 50.00    Types: Cigarettes    Quit date: 09/13/2017    Years since quitting: 5.1   Smokeless tobacco: Never  Vaping Use   Vaping Use: Never used  Substance Use Topics   Alcohol use: Never   Drug use: Never    Family History Family History  Problem Relation Age of Onset   Hypertension Mother    Heart disease Mother    Diabetes Mother    Hypertension Father    Heart disease Father     Allergies  Allergen Reactions   Amoxicillin Anaphylaxis   Lisinopril Anaphylaxis   Tramadol Nausea Only   Gabapentin Other (See Comments)    Hands jerking   Colchicine Nausea Only     REVIEW OF SYSTEMS (Negative unless checked)  Constitutional: [] Weight loss  [] Fever  [] Chills Cardiac: [] Chest pain   [] Chest pressure   [] Palpitations   [] Shortness of breath when laying flat   [] Shortness of breath at rest   [] Shortness of breath with exertion. Vascular:  [] Pain in legs with  walking   [] Pain in legs at rest   [] Pain in legs when laying flat   [] Claudication   [] Pain in feet when walking  [] Pain in feet at rest  [] Pain in feet when laying flat   [] History of DVT   [] Phlebitis   [] Swelling in legs   [] Varicose veins   [] Non-healing ulcers Pulmonary:   [] Uses home oxygen   [] Productive cough   [] Hemoptysis   [] Wheeze  [] COPD   [] Asthma Neurologic:  [] Dizziness  [] Blackouts   [] Seizures   [] History of stroke   [] History of TIA  [] Aphasia   [] Temporary blindness   [] Dysphagia   [x] Weakness or numbness in arms   [] Weakness or numbness in legs Musculoskeletal:  [] Arthritis   [] Joint swelling   [] Joint pain   [] Low back pain Hematologic:  [] Easy bruising  [] Easy bleeding   [] Hypercoagulable state   [x] Anemic  [] Hepatitis Gastrointestinal:  [x] Blood in stool   [] Vomiting blood  [x] Gastroesophageal reflux/heartburn   [] Difficulty swallowing. Genitourinary:  [x] Chronic kidney disease   [] Difficult urination  [] Frequent urination  [] Burning with urination   [] Blood in urine Skin:  [] Rashes   [] Ulcers   [] Wounds Psychological:  [] History of anxiety   []  History of major depression.  Physical Examination  Vitals:   10/27/22 1723 10/27/22 1930 10/28/22 0409 10/28/22 0734  BP: (!) 121/57 (!) 133/59 (!) 134/53 (!) 142/48  Pulse: 97 89 81 79  Resp: 18 16 16 16   Temp: 98.4 F (36.9 C) 98.6 F (37 C) 98 F (36.7 C) 98 F (36.7 C)  TempSrc: Oral Oral Oral Oral  SpO2: 94% 96% 96% 98%  Weight:      Height:  Body mass index is 26.34 kg/m. Gen:  WD/WN, NAD Head: /AT, No temporalis wasting. Prominent temp pulse not noted. Ear/Nose/Throat: Hearing grossly intact, nares w/o erythema or drainage, oropharynx w/o Erythema/Exudate Eyes: Sclera non-icteric, conjunctiva clear Neck: Trachea midline.  No JVD.  Pulmonary:  Good air movement, respirations not labored, equal bilaterally.  Cardiac: RRR, normal S1, S2. Vascular: Palpable pulses throughout.  Right lower extremity pain  to touch. Vessel Right Left  Radial Palpable Palpable  Ulnar Palpable Palpable  Brachial Palpable Palpable  Carotid Palpable, without bruit Palpable, without bruit  Aorta Not palpable N/A  Femoral Palpable Palpable  Popliteal Palpable Palpable  PT Palpable Palpable  DP Palpable Palpable   Gastrointestinal: soft, non-tender/non-distended. No guarding/reflex.  Musculoskeletal: M/S 5/5 throughout.  Extremities without ischemic changes.  No deformity or atrophy. No edema. Neurologic: Sensation grossly intact in extremities.  Symmetrical.  Speech is fluent. Motor exam as listed above. Psychiatric: Judgment intact, Mood & affect appropriate for pt's clinical situation. Dermatologic: No rashes or ulcers noted.  No cellulitis or open wounds. Lymph : No Cervical, Axillary, or Inguinal lymphadenopathy.    CBC Lab Results  Component Value Date   WBC 18.0 (H) 10/28/2022   HGB 8.7 (L) 10/28/2022   HCT 27.4 (L) 10/28/2022   MCV 88.4 10/28/2022   PLT 348 10/28/2022    BMET    Component Value Date/Time   NA 137 10/28/2022 0420   K 3.7 10/28/2022 0420   CL 90 (L) 10/28/2022 0420   CO2 33 (H) 10/28/2022 0420   GLUCOSE 229 (H) 10/28/2022 0420   BUN 44 (H) 10/28/2022 0420   CREATININE 1.40 (H) 10/28/2022 0420   CALCIUM 8.3 (L) 10/28/2022 0420   GFRNONAA 38 (L) 10/28/2022 0420   Estimated Creatinine Clearance: 28.9 mL/min (A) (by C-G formula based on SCr of 1.4 mg/dL (H)).  COAG Lab Results  Component Value Date   INR 1.0 10/17/2022    Radiology US Carotid Bilateral  Result Date: 10/27/2022 CLINICAL DATA:  81 year old female with a history of stroke EXAM: BILATERAL CAROTID DUPLEX ULTRASOUND TECHNIQUE: Wallace Cullens scale imaging, color Doppler and duplex ultrasound were performed of bilateral carotid and vertebral arteries in the neck. COMPARISON:  None Available. FINDINGS: Criteria: Quantification of carotid stenosis is based on velocity parameters that correlate the residual internal  carotid diameter with NASCET-based stenosis levels, using the diameter of the distal internal carotid lumen as the denominator for stenosis measurement. The following velocity measurements were obtained: RIGHT ICA:  Systolic 149 cm/sec, Diastolic 32 cm/sec CCA:  71 cm/sec SYSTOLIC ICA/CCA RATIO:  2.1 ECA:  154 cm/sec LEFT ICA:  Systolic 491 cm/sec, Diastolic 175 cm/sec CCA:  34 cm/sec SYSTOLIC ICA/CCA RATIO:  14.4 ECA:  155 cm/sec Right Brachial SBP: Not acquired Left Brachial SBP: Not acquired RIGHT CAROTID ARTERY: No significant calcifications of the right common carotid artery. Intermediate waveform maintained. Moderate heterogeneous and partially calcified plaque at the right carotid bifurcation. Some shadowing present. Low resistance waveform of the right ICA. No significant tortuosity. RIGHT VERTEBRAL ARTERY: Antegrade flow with low resistance waveform. LEFT CAROTID ARTERY: No significant calcifications of the left common carotid artery. Intermediate waveform maintained. Moderate heterogeneous and partially calcified plaque at the left carotid bifurcation. Shadowing present. Low resistance waveform of the left ICA. No significant tortuosity. LEFT VERTEBRAL ARTERY:  Antegrade flow with low resistance waveform. 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 Left: Heterogeneous and partially calcified plaque at the  left carotid bifurcation contributes to 70%-99% stenosis by established duplex criteria. Signed, Dulcy Fanny. Nadene Rubins, RPVI [facility] Electronically Signed   By: Corrie Mckusick D.O.   On: 10/27/2022 14:23   MR BRAIN WO CONTRAST  Result Date: 10/26/2022 CLINICAL DATA:  Neuro deficit, acute, stroke suspected. EXAM: MRI HEAD WITHOUT CONTRAST MRA HEAD WITHOUT CONTRAST TECHNIQUE: Multiplanar, multi-echo pulse sequences of the  brain and surrounding structures were acquired without intravenous contrast. Angiographic images of the Circle of Willis were acquired using MRA technique without intravenous contrast. COMPARISON:  Head CT October 25, 2022. FINDINGS: MRI HEAD FINDINGS The study is degraded by motion. Brain: Scattered foci of restricted diffusion are seen in the left centrum semiovale, corona radiata and basal ganglia region in a watershed type distribution. No evidence of hemorrhagic transformation. No hydrocephalus, extra-axial collection or mass lesion. Scattered and confluent foci of T2 hyperintensity are seen within the white matter of the cerebral hemispheres, nonspecific, most likely related to chronic small vessel ischemia. Vascular: Normal flow voids. Skull and upper cervical spine: Grossly unremarkable. Sinuses/Orbits: No acute or significant finding. Other: Right temporomandibular joint effusion. MRA HEAD FINDINGS The study is degraded by motion. Anterior circulation: Asymmetry in caliber of the visualized cervical and intracranial internal carotid arteries, with smaller caliber of the left ICA compared to the right. Luminal irregularity is noted in the cavernous segment bilaterally, suggestive of intracranial atherosclerotic disease without hemodynamically significant stenosis. Flow related enhancement is maintained. Flow related enhancement is also preserved in the bilateral ACA and MCA vascular trees. Diffuse luminal irregularity related to motion artifacts limits evaluation for focal stenosis. Posterior circulation: Normal caliber flow related enhancement of the bilateral intracranial vertebral arteries and basilar artery. The bilateral posterior cerebral arteries are patent. Diffuse luminal irregularity related to motion artifacts limits evaluation for focal stenosis. Anatomic variants: None significant. IMPRESSION: 1. Acute/subacute infarcts in the left centrum semiovale, corona radiata and basal ganglia region in a  watershed type distribution. 2. Moderate chronic microvascular ischemic changes of the white matter. 3. Motion degraded MR angiogram of the head without evidence of hemodynamically significant stenosis. Electronically Signed   By: Pedro Earls M.D.   On: 10/26/2022 15:02   MR ANGIO HEAD WO CONTRAST  Result Date: 10/26/2022 CLINICAL DATA:  Neuro deficit, acute, stroke suspected. EXAM: MRI HEAD WITHOUT CONTRAST MRA HEAD WITHOUT CONTRAST TECHNIQUE: Multiplanar, multi-echo pulse sequences of the brain and surrounding structures were acquired without intravenous contrast. Angiographic images of the Circle of Willis were acquired using MRA technique without intravenous contrast. COMPARISON:  Head CT October 25, 2022. FINDINGS: MRI HEAD FINDINGS The study is degraded by motion. Brain: Scattered foci of restricted diffusion are seen in the left centrum semiovale, corona radiata and basal ganglia region in a watershed type distribution. No evidence of hemorrhagic transformation. No hydrocephalus, extra-axial collection or mass lesion. Scattered and confluent foci of T2 hyperintensity are seen within the white matter of the cerebral hemispheres, nonspecific, most likely related to chronic small vessel ischemia. Vascular: Normal flow voids. Skull and upper cervical spine: Grossly unremarkable. Sinuses/Orbits: No acute or significant finding. Other: Right temporomandibular joint effusion. MRA HEAD FINDINGS The study is degraded by motion. Anterior circulation: Asymmetry in caliber of the visualized cervical and intracranial internal carotid arteries, with smaller caliber of the left ICA compared to the right. Luminal irregularity is noted in the cavernous segment bilaterally, suggestive of intracranial atherosclerotic disease without hemodynamically significant stenosis. Flow related enhancement is maintained. Flow  related enhancement is also preserved in the bilateral ACA and MCA vascular trees. Diffuse  luminal irregularity related to motion artifacts limits evaluation for focal stenosis. Posterior circulation: Normal caliber flow related enhancement of the bilateral intracranial vertebral arteries and basilar artery. The bilateral posterior cerebral arteries are patent. Diffuse luminal irregularity related to motion artifacts limits evaluation for focal stenosis. Anatomic variants: None significant. IMPRESSION: 1. 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 2. Moderate chronic microvascular ischemic changes of the white matter. 3. Motion degraded MR angiogram of the head without evidence of hemodynamically significant stenosis. Electronically Signed   By: Pedro Earls M.D.   On: 10/26/2022 15:02   CT HEAD WO CONTRAST (5MM)  Result Date: 10/25/2022 CLINICAL DATA:  right hand weakness EXAM: CT HEAD WITHOUT CONTRAST TECHNIQUE: Contiguous axial images were obtained from the base of the skull through the vertex without intravenous contrast. RADIATION DOSE REDUCTION: This exam was performed according to the departmental dose-optimization program which includes automated exposure control, adjustment of the mA and/or kV according to patient size and/or use of iterative reconstruction technique. COMPARISON:  None Available. FINDINGS: Brain: Small age indeterminate infarct in the anterior limb the left internal capsule/caudate. No acute hemorrhage, mass lesion, midline shift or hydrocephalus. Patchy white matter hypodensities, nonspecific but compatible with chronic microvascular ischemic disease. Vascular: No hyperdense vessel identified. Calcific atherosclerosis. Skull: No acute fracture. Sinuses/Orbits: Clear sinuses.  Knee surgery findings. IMPRESSION: 1. Small age indeterminate infarct in the anterior limb the left internal capsule/caudate, potentially acute. Recommend MRI to further evaluate. 2. Chronic  microvascular ischemic disease. These results will be called to the ordering clinician or representative by the Radiologist Assistant, and communication documented in the PACS or [employer]. Electronically Signed   By: Margaretha Sheffield M.D.   On: 10/25/2022 18:02   DG Chest Port 1 View  Result Date: 10/24/2022 CLINICAL DATA:  Hypoxia EXAM: PORTABLE CHEST 1 VIEW COMPARISON:  10/19/2022 FINDINGS: Transverse diameter of heart is increased. Thoracic aorta is tortuous and ectatic. There is improvement in aeration in right upper lung field. There is residual prominence of interstitial markings in left parahilar region and both lower lung fields. No new focal pulmonary consolidation is seen. Lateral CP angles are indistinct suggesting small effusions. There is no pneumothorax. IMPRESSION: Cardiomegaly. There is slight prominence of interstitial markings in left parahilar region and both lower lung fields suggesting interstitial edema or interstitial pneumonia. Possible small bilateral pleural effusions. Electronically Signed   By: Elmer Picker M.D.   On: 10/24/2022 14:28   ECHOCARDIOGRAM COMPLETE  Result Date: 10/19/2022    ECHOCARDIOGRAM REPORT   Patient Name:   Kelly Rowland Date of Exam: 10/19/2022 Medical Rec #:  [id number]     Height:       62.0 in Accession #:    [id number]    Weight:       144.0 lb Date of Birth:  09-25-1941     BSA:          1.663 m Patient Age:    51 years      BP:           138/55 mmHg Patient Gender: F             HR:           81 bpm. Exam Location:  [facility] Procedure: 2D Echo, Color Doppler, Cardiac Doppler and 3D Echo Indications:     Acute  Respiratory Distress R06.03  History:         Patient has no prior history of Echocardiogram examinations.                  CAD, COPD; Risk Factors:Hypertension, Dyslipidemia and                  Diabetes.  Sonographer:     Mikki Santee RDCS Referring Phys:  [place] Diagnosing Phys: Kathlyn Sacramento MD IMPRESSIONS  1. Left  ventricular ejection fraction, by estimation, is 55 to 60%. The left ventricle has normal function. The left ventricle has no regional wall motion abnormalities. There is mild left ventricular hypertrophy. Left ventricular diastolic parameters were normal.  2. Right ventricular systolic function is normal. The right ventricular size is normal. Tricuspid regurgitation signal is inadequate for assessing PA pressure.  3. Left atrial size was mildly dilated.  4. The mitral valve is normal in structure. Mild mitral valve regurgitation. No evidence of mitral stenosis.  5. The aortic valve is normal in structure. Aortic valve regurgitation is not visualized. Aortic valve sclerosis is present, with no evidence of aortic valve stenosis.  6. The inferior vena cava is dilated in size with >50% respiratory variability, suggesting right atrial pressure of 8 mmHg. FINDINGS  Left Ventricle: Left ventricular ejection fraction, by estimation, is 55 to 60%. The left ventricle has normal function. The left ventricle has no regional wall motion abnormalities. The left ventricular internal cavity size was normal in size. There is  mild left ventricular hypertrophy. Left ventricular diastolic parameters were normal. Right Ventricle: The right ventricular size is normal. No increase in right ventricular wall thickness. Right ventricular systolic function is normal. Tricuspid regurgitation signal is inadequate for assessing PA pressure. Left Atrium: Left atrial size was mildly dilated. Right Atrium: Right atrial size was normal in size. Pericardium: There is no evidence of pericardial effusion. Mitral Valve: The mitral valve is normal in structure. Mild mitral valve regurgitation. No evidence of mitral valve stenosis. Tricuspid Valve: The tricuspid valve is normal in structure. Tricuspid valve regurgitation is trivial. No evidence of tricuspid stenosis. Aortic Valve: The aortic valve is normal in structure. Aortic valve regurgitation is  not visualized. Aortic valve sclerosis is present, with no evidence of aortic valve stenosis. Pulmonic Valve: The pulmonic valve was normal in structure. Pulmonic valve regurgitation is not visualized. No evidence of pulmonic stenosis. Aorta: The aortic root is normal in size and structure. Venous: The inferior vena cava is dilated in size with greater than 50% respiratory variability, suggesting right atrial pressure of 8 mmHg. IAS/Shunts: No atrial level shunt detected by color flow Doppler.  LEFT VENTRICLE PLAX 2D LVIDd:         4.10 cm   Diastology LVIDs:         2.80 cm   LV e' medial:    8.81 cm/s LV PW:         1.20 cm   LV E/e' medial:  14.3 LV IVS:        1.20 cm   LV e' lateral:   8.49 cm/s LVOT diam:     2.10 cm   LV E/e' lateral: 14.8 LV SV:         84 LV SV Index:   51 LVOT Area:     3.46 cm                           3D  Volume EF:                          3D EF:        65 %                          LV EDV:       137 ml                          LV ESV:       47 ml                          LV SV:        90 ml RIGHT VENTRICLE RV Basal diam:  3.60 cm RV Mid diam:    3.40 cm RV S prime:     14.30 cm/s TAPSE (M-mode): 2.5 cm LEFT ATRIUM             Index        RIGHT ATRIUM           Index LA diam:        3.40 cm 2.05 cm/m   RA Area:     13.20 cm LA Vol (A2C):   50.8 ml 30.55 ml/m  RA Volume:   29.20 ml  17.56 ml/m LA Vol (A4C):   54.4 ml 32.72 ml/m LA Biplane Vol: 55.7 ml 33.50 ml/m  AORTIC VALVE LVOT Vmax:   114.00 cm/s LVOT Vmean:  76.800 cm/s LVOT VTI:    0.243 m  AORTA Ao Root diam: 2.50 cm MITRAL VALVE MV Area (PHT): 3.89 cm     SHUNTS MV Decel Time: 195 msec     Systemic VTI:  0.24 m MV E velocity: 126.00 cm/s  Systemic Diam: 2.10 cm MV A velocity: 118.00 cm/s MV E/A ratio:  1.07 Muhammad Arida MD Electronically signed by Muhammad Arida MD Signature Date/Time: 10/19/2022/2:56:53 PM    Final    CT PANCREAS ABD W/WO  Result Date: 10/19/2022 CLINICAL DATA:  Abdominal pain. Concern for  pancreatic mass on endoscopic exam. EXAM: CT ABDOMEN WITHOUT AND WITH CONTRAST TECHNIQUE: Multidetector CT imaging of the abdomen was performed following the standard protocol before and following the bolus administration of intravenous contrast. RADIATION DOSE REDUCTION: This exam was performed according to the departmental dose-optimization program which includes automated exposure control, adjustment of the mA and/or kV according to patient size and/or use of iterative reconstruction technique. CONTRAST:  75mL OMNIPAQUE IOHEXOL 350 MG/ML SOLN COMPARISON:  None Available. FINDINGS: Lower chest:  Mild bibasilar atelectasis. Hepatobiliary: No focal hepatic lesion. No biliary duct dilatation. Gallbladder normal. Common bile duct normal. Pancreas: No pancreatic ductal dilatation. Pancreas is mildly atrophic. No mass lesion identified within the pancreas. No peripancreatic adenopathy. Spleen: Normal spleen. Adrenals/urinary tract: Bilobed simple fluid attenuation cystic lesion of the mid LEFT kidney measures 3.4 cm. There is a central thin calcified cyst wall between the bilobed cysts (image 20/2). Two nonobstructing calculi LEFT kidney. No ureterolithiasis proximally. Stomach/Bowel: Stomach and duodenum appear normal. Limited view of the small bowelcolon Unremarkable. Vascular/Lymphatic: Abdominal aorta is normal caliber with atherosclerotic calcification. There is no retroperitoneal or periportal lymphadenopathy. No pelvic lymphadenopathy. Musculoskeletal: No aggressive osseous lesion IMPRESSION: 1. No pancreatic mass identified. No pancreatic duct dilatation or common bile duct dilatation. 2. Mild pancreatic atrophy consistent with patient's age. 3. Normal   liver without duct dilatation. 4. LEFT Bosniak II benign renal cyst measuring 3.4 cm. No follow-up imaging is recommended. JACR 2018 Feb; 264-273, Management of the Incidental Renal Mass on CT, [employer] 2021; 814-848, Bosniak Classification of Cystic Renal  Masses, Version 2019. Electronically Signed   By: Suzy Bouchard M.D.   On: 10/19/2022 10:49   DG Chest Port 1 View  Result Date: 10/19/2022 CLINICAL DATA:  Shortness of breath. EXAM: PORTABLE CHEST 1 VIEW COMPARISON:  Chest radiographs 06/04/2020 FINDINGS: The cardiac silhouette is upper limits of normal in size. Aortic atherosclerosis is noted. There are increased interstitial densities throughout both lungs. No sizable pleural effusion or pneumothorax is identified. No acute osseous abnormality is seen. IMPRESSION: Bilateral interstitial densities which may reflect edema or viral/atypical infection. Electronically Signed   By: Logan Bores M.D.   On: 10/19/2022 08:44      Assessment/Plan 1.  Stroke/carotid stenosis.  We will plan for carotid angiogram with possible angioplasty and stent placement in 1 week with improvement of patient's CKD and creatinine.  2.  Right lower extremity pain.  Patient was extremely painful to slight touch today on examination of her right lower extremity.  We suggest duplex ultrasound of her lower extremities, right greater than left.  Evaluate PAD and rule out thrombus.  3. Continue BP control as already ordered, agree with neurology plan for SBP goals.  These interventions/medications have been reviewed and there are no changes at this time. Primary Team to manage.    Plan of care discussed with Dr Leotis Pain and he is in agreement with plan noted above.   Family Communication:  Total Time:75 I spent 75 minutes in this encounter including personally reviewing extensive medical records, personally reviewing imaging studies and compared to prior scans, counseling the patient, placing orders, coordinating care and performing appropriate documentation  Thank you for allowing [place] to participate in the care of this patient.   Drema Pry, NP [facility] [phone] (Office Phone) [phone] (Office Fax) [phone]  (Pager)  10/28/2022 12:19 PM  Staff may message me via secure chat in [place]  but this may not receive immediate response,  please page for urgent matters!  Dictation software was used to generate the above note. Typos may occur and escape review, as with typed/written notes. Any error is purely unintentional.  Please contact me directly for clarity if needed.

## 2022-10-28 NOTE — Progress Notes (Addendum)
OT Cancellation Note  Patient Details Name: Jemya Depierro MRN: [id number] DOB: October 23, 1941   Cancelled Treatment:    Reason Eval/Treat Not Completed: Fatigue/lethargy limiting ability to participate. Other (comment): Upon entering session, pt sitting up in bed eating with visitor present. Pt also reporting fatigue from working with physical therapy. Pt requesting OT come back later. Will re-attempt as able.   Gerrie Nordmann 10/28/2022, 3:48 PM

## 2022-10-28 NOTE — Progress Notes (Signed)
Patient BG 428. MD Chipper Herb made aware. Ok to give full sliding scale amount.

## 2022-10-28 NOTE — Progress Notes (Signed)
   10/28/22 2120  Provider Notification  Provider Name/Title NP [facility]  Date Provider Notified 10/28/22  Time Provider Notified 2150  Method of Notification Page (secure chat)  Notification Reason Other (Comment) (CBG 411)  Provider response Other (Comment);See new orders (Give Novolog 10  units x1)  Date of Provider Response 10/28/22  Time of Provider Response 2155   CBG rechecked after 10 units of Novolog, it is down to 333mg /dl. NP Foust updated.

## 2022-10-28 NOTE — Progress Notes (Signed)
Progress Note   Patient: Kelly Rowland HYQ:[id number] DOB: [birth year] DOA: 10/17/2022     11 DOS: the patient was seen and examined on 10/28/2022   Brief hospital course: Kelly Rowland is a 81 y.o. 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  EGD was performed on 11/5, was found to have bleeding friable duodenal mucosa, treated with cauterization.  Patient developed acute respiratory failure with pulmonary edema after fluid resuscitation and blood transfusion.  Was treated with IV Lasix, echocardiogram showed normal heart function.   Patient developed acute stroke on 11/12, MRI showed acute/subacute infarcts in the left centrum semiovale, corona radiata and basal ganglia region in a watershed type distribution.  Neurology recommend aspirin 81 mg daily, avoid hypotension.  Carotid ultrasound showed 70 to 99% stenosis in the right internal carotid arteries.  Vascular surgery consulted on 11/15. Patient also treated steroids for possible acute gout with bilateral foot pain and ankle pain.   Assessment and Plan:  [name], resolved Duodenal ulcer with GI bleed. Acute blood loss anemia. Patient is status post EGD, cauterization of bleeding.  Condition has improved, hemoglobin has been stabilized.   acute hypoxic respiratory failure suspect acute pulmonary edema with volume overload PNA ruled in -- developed hypoxia on 11/5.  chest x-ray showed bilateral pulmonary edema.  Likely from fluid overload from IVF and blood transfusion --O2 requirement went up to 4L morning of 11/11, CXR suggested interstitial edema or interstitial PNA.  Procal increased.  Started on IV levaquin. Patient condition has improved, will wean off oxygen.  Volume status better.  Will  complete 5 days of Levaquin.   Acute stroke, not POA Carotid stenosis. --pt first reported weakness in right hand on 11/12, symptom had been present for about 2 days. --MRI brain 11/13 showed Acute/subacute infarcts in the left centrum semiovale, corona radiata and basal ganglia region in a watershed type distribution. Condition had improved, patient will be seen by Dr. Wyn Quaker for carotid stenosis.  Planning surgery 1 week after initial stroke.   Bilateral foot and ankle pain Possible acute gout --this was present prior to arrival, intermittent with varying severity, and pt already had extensive outpatient workup with suggestions of gout, swelling or nerve as etiology of the pain.  Had MRI left ankle on 08/31/22, however, currently complained of severe right ankle pain. --pt was able to walk with PT on 10/18/22, but since around 10/22/22, pt has been complaining of severe bilateral foot pain and inability to tolerate any dorsi-flexion, let along bear weight on her feet.  Both ankles however look normal without erythema or edema. Patient is treated with steroids for possible gout attack.  Condition improving.   CHF ruled out Echo showed normal systolic and diastolic function   Chest pain, ACS ruled out history of CAD status post stent --trop neg.  Cardiology consulted, ruled out ACS.   Hypokalemia Hyponatremia, resolved Hypophosphatemia resolved.  CKD stage IIIb Renal function stable, continue monitor electrolytes.     Essential hypertension --Given the watershed distribution of her strokes, must avoid hypotension.  Would maintain a SBP goal of 130-150 for now.  --hold home coreg and amlodipine   DM2 with hypoglycemia and hyperglycemia  Continue current regimen, hypoglycemia has resolved.   Chronic back pain on chronic opioids Continue as needed pain medicine   Dysuria, resolved --  resolved without abx.     Fever of unknown etiology, resolved --has intermittent fevers.  Blood cx neg  so far.  Urine cx only 30,000 colonies.  COVID and RVP neg.       Subjective:  Patient doing well today, denies any shortness of breath.  Physical Exam: Vitals:   10/27/22 1723 10/27/22 1930 10/28/22 0409 10/28/22 0734  BP: (!) 121/57 (!) 133/59 (!) 134/53 (!) 142/48  Pulse: 97 89 81 79  Resp: 18 16 16 16   Temp: 98.4 F (36.9 C) 98.6 F (37 C) 98 F (36.7 C) 98 F (36.7 C)  TempSrc: Oral Oral Oral Oral  SpO2: 94% 96% 96% 98%  Weight:      Height:       General exam: Appears calm and comfortable  Respiratory system: Decreased breath sounds.  Respiratory effort normal. Cardiovascular system: S1 & S2 heard, RRR. No JVD, murmurs, rubs, gallops or clicks. No pedal edema. Gastrointestinal system: Abdomen is nondistended, soft and nontender. No organomegaly or masses felt. Normal bowel sounds heard. Central nervous system: Alert and oriented. No focal neurological deficits. Extremities: Symmetric 5 x 5 power. Skin: No rashes, lesions or ulcers Psychiatry: Judgement and insight appear normal. Mood & affect appropriate.   Data Reviewed:  Reviewed echocardiogram results, x-ray, lab results.  Family Communication: son updated over the phone  Disposition: Status is: Inpatient Remains inpatient appropriate because: Severity of disease, pending procedure decision.  Planned Discharge Destination: Skilled nursing facility    Time spent: 55 minutes  Author: , MD 10/28/2022 12:45 PM  For on call review [email or web address].

## 2022-10-28 NOTE — Inpatient Diabetes Management (Signed)
Inpatient Diabetes Program Recommendations  AACE/ADA: New Consensus Statement on Inpatient Glycemic Control (2015)  Target Ranges:  Prepandial:   less than 140 mg/dL      Peak postprandial:   less than 180 mg/dL (1-2 hours)      Critically ill patients:  140 - 180 mg/dL   Lab Results  Component Value Date   GLUCAP 295 (H) 10/28/2022   HGBA1C 7.2 (H) 10/17/2022    Review of Glycemic Control  Latest Reference Range & Units 10/28/22 08:05 10/28/22 11:20  Glucose-Capillary 70 - 99 mg/dL 830 (H) 940 (H)  (H): Data is abnormally high  Diabetes history: DM2 Outpatient Diabetes medications: Amaryl 4 mg QAM, NPH 24 units QD Current orders for Inpatient glycemic control: Novolog 0-15 units TID, Semglee 10 QD, prednisone 40 mg QD  Inpatient Diabetes Program Recommendations:    Spoke with patient at bedside.  She is eating a little better today.  She does not like the food here.  Encouraged her to review the menu and order something she likes for dinner.  She ate a good breakfast this morning and noon CBG was 295 mg/dL.  Please consider Novolog 3 units TID with meals if consumes at least 50%.  Will continue to follow while inpatient.  Thank you, Dulce Sellar, MSN, CDCES Diabetes Coordinator Inpatient Diabetes Program [phone] (team pager from 8a-5p)

## 2022-10-29 DIAGNOSIS — K264 Chronic or unspecified duodenal ulcer with hemorrhage: Secondary | ICD-10-CM | POA: Diagnosis not present

## 2022-10-29 DIAGNOSIS — I6389 Other cerebral infarction: Secondary | ICD-10-CM

## 2022-10-29 DIAGNOSIS — I63239 Cerebral infarction due to unspecified occlusion or stenosis of unspecified carotid arteries: Secondary | ICD-10-CM | POA: Diagnosis not present

## 2022-10-29 DIAGNOSIS — D62 Acute posthemorrhagic anemia: Secondary | ICD-10-CM | POA: Diagnosis not present

## 2022-10-29 LAB — CBC
HCT: 26.1 % — ABNORMAL LOW (ref 36.0–46.0)
Hemoglobin: 8.4 g/dL — ABNORMAL LOW (ref 12.0–15.0)
MCH: 28 pg (ref 26.0–34.0)
MCHC: 32.2 g/dL (ref 30.0–36.0)
MCV: 87 fL (ref 80.0–100.0)
Platelets: 381 10*3/uL (ref 150–400)
RBC: 3 MIL/uL — ABNORMAL LOW (ref 3.87–5.11)
RDW: 15.8 % — ABNORMAL HIGH (ref 11.5–15.5)
WBC: 19.9 10*3/uL — ABNORMAL HIGH (ref 4.0–10.5)
nRBC: 0 % (ref 0.0–0.2)

## 2022-10-29 LAB — BASIC METABOLIC PANEL
Anion gap: 12 (ref 5–15)
BUN: 47 mg/dL — ABNORMAL HIGH (ref 8–23)
CO2: 33 mmol/L — ABNORMAL HIGH (ref 22–32)
Calcium: 8.4 mg/dL — ABNORMAL LOW (ref 8.9–10.3)
Chloride: 89 mmol/L — ABNORMAL LOW (ref 98–111)
Creatinine, Ser: 1.22 mg/dL — ABNORMAL HIGH (ref 0.44–1.00)
GFR, Estimated: 45 mL/min — ABNORMAL LOW (ref >60)
Glucose, Bld: 239 mg/dL — ABNORMAL HIGH (ref 70–99)
Potassium: 3.3 mmol/L — ABNORMAL LOW (ref 3.5–5.1)
Sodium: 134 mmol/L — ABNORMAL LOW (ref 135–145)

## 2022-10-29 LAB — GLUCOSE, CAPILLARY
Glucose-Capillary: 208 mg/dL — ABNORMAL HIGH (ref 70–99)
Glucose-Capillary: 305 mg/dL — ABNORMAL HIGH (ref 70–99)
Glucose-Capillary: 295 mg/dL — ABNORMAL HIGH (ref 70–99)
Glucose-Capillary: 170 mg/dL — ABNORMAL HIGH (ref 70–99)
Glucose-Capillary: 194 mg/dL — ABNORMAL HIGH (ref 70–99)

## 2022-10-29 LAB — MAGNESIUM: Magnesium: 2.4 mg/dL (ref 1.7–2.4)

## 2022-10-29 MED ORDER — INSULIN GLARGINE-YFGN 100 UNIT/ML ~~LOC~~ SOLN
16.0000 [IU] | Freq: Every day | SUBCUTANEOUS | Status: DC
  Administered 2022-10-29: 16 [IU] via SUBCUTANEOUS
  Filled 2022-10-29: qty 0.16

## 2022-10-29 MED ORDER — POTASSIUM CHLORIDE CRYS ER 20 MEQ PO TBCR
40.0000 meq | EXTENDED_RELEASE_TABLET | ORAL | Status: AC
  Administered 2022-10-29 (×2): 40 meq via ORAL
  Filled 2022-10-29 (×2): qty 2

## 2022-10-29 MED ORDER — INSULIN GLARGINE-YFGN 100 UNIT/ML ~~LOC~~ SOLN
12.0000 [IU] | Freq: Every day | SUBCUTANEOUS | Status: DC
  Administered 2022-10-30 – 2022-11-01 (×3): 12 [IU] via SUBCUTANEOUS
  Administered 2022-11-02: 4 [IU] via SUBCUTANEOUS
  Administered 2022-11-03 – 2022-11-04 (×2): 12 [IU] via SUBCUTANEOUS
  Filled 2022-10-29 (×6): qty 0.12

## 2022-10-29 MED ORDER — ENOXAPARIN SODIUM 40 MG/0.4ML IJ SOSY
40.0000 mg | PREFILLED_SYRINGE | INTRAMUSCULAR | Status: DC
  Administered 2022-10-29 – 2022-11-03 (×6): 40 mg via SUBCUTANEOUS
  Filled 2022-10-29 (×6): qty 0.4

## 2022-10-29 MED ORDER — CLOPIDOGREL BISULFATE 75 MG PO TABS
75.0000 mg | ORAL_TABLET | Freq: Every day | ORAL | Status: DC
  Administered 2022-10-29 – 2022-10-30 (×2): 75 mg via ORAL
  Filled 2022-10-29 (×2): qty 1

## 2022-10-29 NOTE — Progress Notes (Signed)
Progress Note   Patient: Kelly Rowland PTW:[id number] DOB: [birth year] DOA: 10/17/2022     12 DOS: the patient was seen and examined on 10/29/2022   Brief hospital course: Kelly Rowland is a 81 y.o. 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  EGD was performed on 11/5, was found to have bleeding friable duodenal mucosa, treated with cauterization.  Patient developed acute respiratory failure with pulmonary edema after fluid resuscitation and blood transfusion.  Was treated with IV Lasix, echocardiogram showed normal heart function.   Patient developed acute stroke on 11/12, MRI showed acute/subacute infarcts in the left centrum semiovale, corona radiata and basal ganglia region in a watershed type distribution.  Neurology recommend aspirin 81 mg daily, avoid hypotension.  Carotid ultrasound showed 70 to 99% stenosis in the right internal carotid arteries.  Vascular surgery consulted on 11/15. Patient also treated steroids for possible acute gout with bilateral foot pain and ankle pain.   Assessment and Plan: [name], resolved Duodenal ulcer with GI bleed. Acute blood loss anemia. Patient is status post EGD, cauterization of bleeding.  Condition has improved, hemoglobin has been stabilized. Discussed with Dr. Tobi Bastos,  patient patient will need aspirin, Plavix and statin for carotid artery stent placement.  He is okay to restart Plavix.  But will need additional 4 weeks of twice daily.  We will also need to monitor her hemoglobin while on Plavix, we will have to discontinue Plavix if hemoglobin drop again.   acute hypoxic respiratory failure suspect acute pulmonary edema with volume overload PNA ruled in -- developed hypoxia on 11/5.  chest x-ray showed  bilateral pulmonary edema.  Likely from fluid overload from IVF and blood transfusion --O2 requirement went up to 4L morning of 11/11, CXR suggested interstitial edema or interstitial PNA.  Procal increased.  Started on IV levaquin. Condition improving, Levaquin will be completed today.   Acute stroke, not POA Carotid stenosis. --pt first reported weakness in right hand on 11/12, symptom had been present for about 2 days. --MRI brain 11/13 showed Acute/subacute infarcts in the left centrum semiovale, corona radiata and basal ganglia region in a watershed type distribution. Discussed with Dr. Wyn Quaker, patient will need to start Plavix in addition to aspirin and statin.  Planning for intervention on Monday.  If patient cannot tolerate Plavix with hemoglobin drop, intervention can still happen on Monday  with angioplasty without stent.   Bilateral foot and ankle pain Possible acute gout --this was present prior to arrival, intermittent with varying severity, and pt already had extensive outpatient workup with suggestions of gout, swelling or nerve as etiology of the pain.  Had MRI left ankle on 08/31/22, however, currently complained of severe right ankle pain. --pt was able to walk with PT on 10/18/22, but since around 10/22/22, pt has been complaining of severe bilateral foot pain and inability to tolerate any dorsi-flexion, let along bear weight on her feet.  Both ankles however look normal without erythema or edema. Condition improved, discontinue steroids.   CHF ruled out Echo showed normal systolic and diastolic function   Chest pain, ACS ruled out history of CAD status post stent --trop neg.  Cardiology consulted, ruled out ACS.   Hypokalemia Hyponatremia Hypophosphatemia resolved.  CKD stage IIIb Renal function still stable, replete potassium again.  Magnesium normal.   Essential  hypertension --Given the watershed distribution of her strokes, must avoid hypotension.  Would maintain a SBP  goal of 130-150 for now.  --hold home coreg and amlodipine   DM2 with hypoglycemia and hyperglycemia  Glucose running high, partially from steroids.  I will discontinue steroids for now.  And monitor glucose.   Chronic back pain on chronic opioids Continue as needed pain medicine   Dysuria, resolved --resolved without abx.     Fever of unknown etiology, resolved --has intermittent fevers.  Blood cx neg so far.  Urine cx only 30,000 colonies.  COVID and RVP neg.           Subjective:  Patient doing well today, still has a poor appetite.  Abdominal discomfort after eating.  No shortness of breath.  Physical Exam: Vitals:   10/29/22 0358 10/29/22 0747 10/29/22 0815 10/29/22 1117  BP: (!) 144/55 (!) 128/56 (!) 135/119 (!) 140/49  Pulse: 81 74 78 81  Resp: 16 15 16    Temp: 98.7 F (37.1 C) 98.5 F (36.9 C) (!) 97.3 F (36.3 C) 97.6 F (36.4 C)  TempSrc: Oral Oral Oral Oral  SpO2: 94% 91% 98% 97%  Weight:      Height:       General exam: Appears calm and comfortable  Respiratory system: Clear to auscultation. Respiratory effort normal. Cardiovascular system: S1 & S2 heard, RRR. No JVD, murmurs, rubs, gallops or clicks. No pedal edema. Gastrointestinal system: Abdomen is nondistended, soft and nontender. No organomegaly or masses felt. Normal bowel sounds heard. Central nervous system: Alert and oriented x2. No focal neurological deficits. Extremities: Symmetric 5 x 5 power. Skin: No rashes, lesions or ulcers Psychiatry:  Mood & affect appropriate.   Data Reviewed:  Lab results reviewed.  Family Communication: son updated.   Disposition: Status is: Inpatient Remains inpatient appropriate because: Severity of disease, inpatient procedure scheduled.  Planned Discharge Destination: Skilled nursing facility    Time spent: 55 minutes  Author: , MD 10/29/2022 11:41 AM  For on call review [email or web address].

## 2022-10-29 NOTE — Inpatient Diabetes Management (Signed)
Inpatient Diabetes Program Recommendations  AACE/ADA: New Consensus Statement on Inpatient Glycemic Control (2015)  Target Ranges:  Prepandial:   less than 140 mg/dL      Peak postprandial:   less than 180 mg/dL (1-2 hours)      Critically ill patients:  140 - 180 mg/dL   Lab Results  Component Value Date   GLUCAP 208 (H) 10/29/2022   HGBA1C 7.2 (H) 10/17/2022    Review of Glycemic Control  Latest Reference Range & Units 10/28/22 08:05 10/28/22 11:20 10/28/22 16:20 10/28/22 21:15 10/28/22 23:23 10/29/22 07:44  Glucose-Capillary 70 - 99 mg/dL 160 (H) 737 (H) 106 (H) 411 (H) 333 (H) 208 (H)  (H): Data is abnormally high  Diabetes history: DM2 Outpatient Diabetes medications: Amaryl 4 mg QAM, NPH 24 units QD Current orders for Inpatient glycemic control: Novolog 0-15 units TID, Semglee 16 QD, prednisone 40 mg QD   Inpatient Diabetes Program Recommendations:    Noted Semglee was increased to 16 units today from 10 units.  Postprandials elevated.  Please consider: Novolog 3 units TID with meals if consumes at least 50% Novolog 0-5 units QHS  Will continue to follow while inpatient.  Thank you, Dulce Sellar, MSN, CDCES Diabetes Coordinator Inpatient Diabetes Program [phone] (team pager from 8a-5p)

## 2022-10-29 NOTE — Progress Notes (Signed)
Subjective: Asking if she can have her CEA during this hospitalization rather than in 1 week, stating "I just want to get it over with and go to therapy".  Objective: Current vital signs: BP (!) 140/49   Pulse 81   Temp 97.6 F (36.4 C) (Oral)   Resp 16   Ht 5\' 2"  (1.575 m)   Wt 65.3 kg   SpO2 97%   BMI 26.34 kg/m  Vital signs in last 24 hours: Temp:  [97.3 F (36.3 C)-98.8 F (37.1 C)] 97.6 F (36.4 C) (11/16 1117) Pulse Rate:  [74-85] 81 (11/16 1117) Resp:  [15-18] 16 (11/16 0815) BP: (128-144)/(49-119) 140/49 (11/16 1117) SpO2:  [91 %-98 %] 97 % (11/16 1117)  Intake/Output from previous day: 11/15 0701 - 11/16 0700 In: 300 [P.O.:300] Out: 1400 [Urine:1400] Intake/Output this shift: Total I/O In: -  Out: 900 [Urine:900] Nutritional status:  Diet Order             DIET SOFT Room service appropriate? No; Fluid consistency: Thin  Diet effective now                   HEENT: [place]/AT Lungs: Respirations unlabored Ext: No edema   Neurologic Exam: Ment: Awake, alert and oriented. No expressive or receptive aphasia.  CN: Fixates normally. Saccades towards visual stimuli are normal. EOMI. No nystagmus. Smile symmetric. Phonation intact.  Motor: Unchanged  Sensory: Intact to FT x 4 with no asymmetry. No extinction. There is allodynia with FT to her feet, right worse than left.  Cerebellar: No ataxia noted Gait: Deferred  Lab Results: Results for orders placed or performed during the hospital encounter of 10/17/22 (from the past 48 hour(s))  Glucose, capillary     Status: None   Collection Time: 10/27/22  1:28 PM  Result Value Ref Range   Glucose-Capillary 79 70 - 99 mg/dL    Comment: Glucose reference range applies only to samples taken after fasting for at least 8 hours.  Glucose, capillary     Status: None   Collection Time: 10/27/22  5:27 PM  Result Value Ref Range   Glucose-Capillary 76 70 - 99 mg/dL    Comment: Glucose reference range applies only to  samples taken after fasting for at least 8 hours.  Glucose, capillary     Status: Abnormal   Collection Time: 10/27/22  9:01 PM  Result Value Ref Range   Glucose-Capillary 231 (H) 70 - 99 mg/dL    Comment: Glucose reference range applies only to samples taken after fasting for at least 8 hours.   Comment 1 Notify RN   Basic metabolic panel     Status: Abnormal   Collection Time: 10/28/22  4:20 AM  Result Value Ref Range   Sodium 137 135 - 145 mmol/L   Potassium 3.7 3.5 - 5.1 mmol/L   Chloride 90 (L) 98 - 111 mmol/L   CO2 33 (H) 22 - 32 mmol/L   Glucose, Bld 229 (H) 70 - 99 mg/dL    Comment: Glucose reference range applies only to samples taken after fasting for at least 8 hours.   BUN 44 (H) 8 - 23 mg/dL   Creatinine, Ser 10/30/22 (H) 0.44 - 1.00 mg/dL   Calcium 8.3 (L) 8.9 - 10.3 mg/dL   GFR, Estimated 38 (L) >60 mL/min    Comment: (NOTE) Calculated using the CKD-EPI Creatinine Equation (2021)    Anion gap 14 5 - 15    Comment: Performed at [facility], [address] [place], [place] [address]  CBC     Status: Abnormal   Collection Time: 10/28/22  4:20 AM  Result Value Ref Range   WBC 18.0 (H) 4.0 - 10.5 K/uL   RBC 3.10 (L) 3.87 - 5.11 MIL/uL   Hemoglobin 8.7 (L) 12.0 - 15.0 g/dL   HCT 40.9 (L) 81.1 - 91.4 %   MCV 88.4 80.0 - 100.0 fL   MCH 28.1 26.0 - 34.0 pg   MCHC 31.8 30.0 - 36.0 g/dL   RDW 78.2 (H) 95.6 - 21.3 %   Platelets 348 150 - 400 K/uL   nRBC 0.0 0.0 - 0.2 %    Comment: Performed at [facility], [address], [place], [place] [address]  Magnesium     Status: None   Collection Time: 10/28/22  4:20 AM  Result Value Ref Range   Magnesium 2.3 1.7 - 2.4 mg/dL    Comment: Performed at [facility], [address] [place], [place] [address]  Glucose, capillary     Status: Abnormal   Collection Time: 10/28/22  8:05 AM  Result Value Ref Range   Glucose-Capillary 182 (H) 70 - 99 mg/dL    Comment: Glucose reference range applies only  to samples taken after fasting for at least 8 hours.  Glucose, capillary     Status: Abnormal   Collection Time: 10/28/22 11:20 AM  Result Value Ref Range   Glucose-Capillary 295 (H) 70 - 99 mg/dL    Comment: Glucose reference range applies only to samples taken after fasting for at least 8 hours.  Glucose, capillary     Status: Abnormal   Collection Time: 10/28/22  4:20 PM  Result Value Ref Range   Glucose-Capillary 428 (H) 70 - 99 mg/dL    Comment: Glucose reference range applies only to samples taken after fasting for at least 8 hours.  Glucose, capillary     Status: Abnormal   Collection Time: 10/28/22  9:15 PM  Result Value Ref Range   Glucose-Capillary 411 (H) 70 - 99 mg/dL    Comment: Glucose reference range applies only to samples taken after fasting for at least 8 hours.   Comment 1 Notify RN   Glucose, capillary     Status: Abnormal   Collection Time: 10/28/22 11:23 PM  Result Value Ref Range   Glucose-Capillary 333 (H) 70 - 99 mg/dL    Comment: Glucose reference range applies only to samples taken after fasting for at least 8 hours.  Basic metabolic panel     Status: Abnormal   Collection Time: 10/29/22  6:02 AM  Result Value Ref Range   Sodium 134 (L) 135 - 145 mmol/L   Potassium 3.3 (L) 3.5 - 5.1 mmol/L   Chloride 89 (L) 98 - 111 mmol/L   CO2 33 (H) 22 - 32 mmol/L   Glucose, Bld 239 (H) 70 - 99 mg/dL    Comment: Glucose reference range applies only to samples taken after fasting for at least 8 hours.   BUN 47 (H) 8 - 23 mg/dL   Creatinine, Ser 2.95 (H) 0.44 - 1.00 mg/dL   Calcium 8.4 (L) 8.9 - 10.3 mg/dL   GFR, Estimated 45 (L) >60 mL/min    Comment: (NOTE) Calculated using the CKD-EPI Creatinine Equation (2021)    Anion gap 12 5 - 15    Comment: Performed at [facility], [address] [place], [place] [address]  CBC     Status: Abnormal  Collection Time: 10/29/22  6:02 AM  Result Value Ref Range   WBC 19.9 (H) 4.0 - 10.5 K/uL   RBC 3.00 (L) 3.87  - 5.11 MIL/uL   Hemoglobin 8.4 (L) 12.0 - 15.0 g/dL   HCT 19.1 (L) 47.8 - 29.5 %   MCV 87.0 80.0 - 100.0 fL   MCH 28.0 26.0 - 34.0 pg   MCHC 32.2 30.0 - 36.0 g/dL   RDW 62.1 (H) 30.8 - 65.7 %   Platelets 381 150 - 400 K/uL   nRBC 0.0 0.0 - 0.2 %    Comment: Performed at [facility], [address] [place], [place] [address]  Magnesium     Status: None   Collection Time: 10/29/22  6:02 AM  Result Value Ref Range   Magnesium 2.4 1.7 - 2.4 mg/dL    Comment: Performed at [facility], [address] [place], [place] [address]  Glucose, capillary     Status: Abnormal   Collection Time: 10/29/22  7:44 AM  Result Value Ref Range   Glucose-Capillary 208 (H) 70 - 99 mg/dL    Comment: Glucose reference range applies only to samples taken after fasting for at least 8 hours.   Comment 1 Notify RN    Comment 2 Document in Chart   Glucose, capillary     Status: Abnormal   Collection Time: 10/29/22 11:24 AM  Result Value Ref Range   Glucose-Capillary 305 (H) 70 - 99 mg/dL    Comment: Glucose reference range applies only to samples taken after fasting for at least 8 hours.  Glucose, capillary     Status: Abnormal   Collection Time: 10/29/22 11:49 AM  Result Value Ref Range   Glucose-Capillary 295 (H) 70 - 99 mg/dL    Comment: Glucose reference range applies only to samples taken after fasting for at least 8 hours.   Comment 1 Notify RN    Comment 2 Document in Chart     Recent Results (from the past 240 hour(s))  Culture, blood (Routine X 2) w Reflex to ID Panel     Status: None   Collection Time: 10/21/22 10:31 AM   Specimen: BLOOD  Result Value Ref Range Status   Specimen Description BLOOD BLOOD LEFT HAND  Final   Special Requests   Final    BOTTLES DRAWN AEROBIC AND ANAEROBIC Blood Culture adequate volume   Culture   Final    NO GROWTH 5 DAYS Performed at [facility], [address] [place], [place] [address]    Report Status 10/26/2022 FINAL   Final  Culture, blood (Routine X 2) w Reflex to ID Panel     Status: None   Collection Time: 10/21/22 10:31 AM   Specimen: BLOOD  Result Value Ref Range Status   Specimen Description BLOOD BLOOD RIGHT HAND  Final   Special Requests   Final    BOTTLES DRAWN AEROBIC AND ANAEROBIC Blood Culture adequate volume   Culture   Final    NO GROWTH 5 DAYS Performed at [facility], [address] [place], [place] [address]    Report Status 10/26/2022 FINAL  Final  Urine Culture     Status: Abnormal   Collection Time: 10/21/22  1:00 PM   Specimen: Urine, Random  Result Value Ref Range Status   Specimen Description   Final    URINE, RANDOM Performed at [facility], [address] [place], [place] [address]    Special Requests  Final    NONE Performed at [facility], [address] [place], [place] [address]    Culture 30,000 COLONIES/mL PROTEUS MIRABILIS (A)  Final   Report Status 10/23/2022 FINAL  Final   Organism ID, Bacteria PROTEUS MIRABILIS (A)  Final      Susceptibility   Proteus mirabilis - MIC*    AMPICILLIN <=2 SENSITIVE Sensitive     CEFAZOLIN <=4 SENSITIVE Sensitive     CEFEPIME <=0.12 SENSITIVE Sensitive     CEFTRIAXONE <=0.25 SENSITIVE Sensitive     CIPROFLOXACIN <=0.25 SENSITIVE Sensitive     GENTAMICIN <=1 SENSITIVE Sensitive     IMIPENEM 2 SENSITIVE Sensitive     NITROFURANTOIN 128 RESISTANT Resistant     TRIMETH/SULFA <=20 SENSITIVE Sensitive     AMPICILLIN/SULBACTAM <=2 SENSITIVE Sensitive     PIP/TAZO <=4 SENSITIVE Sensitive     * 30,000 COLONIES/mL PROTEUS MIRABILIS  SARS Coronavirus 2 by RT PCR (hospital order, performed in [facility] hospital lab) *cepheid single result test* Anterior Nasal Swab     Status: None   Collection Time: 10/22/22 12:18 PM   Specimen: Anterior Nasal Swab  Result Value Ref Range Status   SARS Coronavirus 2 by RT PCR NEGATIVE NEGATIVE Final    Comment: (NOTE) SARS-CoV-2 target nucleic acids are NOT  DETECTED.  The SARS-CoV-2 RNA is generally detectable in upper and lower respiratory specimens during the acute phase of infection. The lowest concentration of SARS-CoV-2 viral copies this assay can detect is 250 copies / mL. A negative result does not preclude SARS-CoV-2 infection and should not be used as the sole basis for treatment or other patient management decisions.  A negative result may occur with improper specimen collection / handling, submission of specimen other than nasopharyngeal swab, presence of viral mutation(s) within the areas targeted by this assay, and inadequate number of viral copies (<250 copies / mL). A negative result must be combined with clinical observations, patient history, and epidemiological information.  Fact Sheet for Patients:   RoadLapTop.co.za  Fact Sheet for Healthcare Providers: http://kim-miller.com/  This test is not yet approved or  cleared by the [place] FDA and has been authorized for detection and/or diagnosis of SARS-CoV-2 by FDA under an Emergency Use Authorization (EUA).  This EUA will remain in effect (meaning this test can be used) for the duration of the COVID-19 declaration under Section 564(b)(1) of the Act, 21 U.S.C. section 360bbb-3(b)(1), unless the authorization is terminated or revoked sooner.  Performed at [facility], [address] [place], [place] [address]   Respiratory (~20 pathogens) panel by PCR     Status: None   Collection Time: 10/22/22 12:18 PM   Specimen: Nasopharyngeal Swab; Respiratory  Result Value Ref Range Status   Adenovirus NOT DETECTED NOT DETECTED Final   Coronavirus 229E NOT DETECTED NOT DETECTED Final    Comment: (NOTE) The Coronavirus on the Respiratory Panel, DOES NOT test for the novel  Coronavirus (2019 nCoV)    Coronavirus HKU1 NOT DETECTED NOT DETECTED Final   Coronavirus NL63 NOT DETECTED NOT DETECTED Final   Coronavirus  OC43 NOT DETECTED NOT DETECTED Final   Metapneumovirus NOT DETECTED NOT DETECTED Final   Rhinovirus / Enterovirus NOT DETECTED NOT DETECTED Final   Influenza A NOT DETECTED NOT DETECTED Final   Influenza B NOT DETECTED NOT DETECTED Final   Parainfluenza Virus 1 NOT DETECTED NOT DETECTED Final   Parainfluenza Virus 2 NOT DETECTED NOT DETECTED Final   Parainfluenza Virus 3 NOT DETECTED  NOT DETECTED Final   Parainfluenza Virus 4 NOT DETECTED NOT DETECTED Final   Respiratory Syncytial Virus NOT DETECTED NOT DETECTED Final   Bordetella pertussis NOT DETECTED NOT DETECTED Final   Bordetella Parapertussis NOT DETECTED NOT DETECTED Final   Chlamydophila pneumoniae NOT DETECTED NOT DETECTED Final   Mycoplasma pneumoniae NOT DETECTED NOT DETECTED Final    Comment: Performed at [facility], [address] [place], [place] [address]    Lipid Panel Recent Labs    10/26/22 1811  CHOL 73  TRIG 122  HDL 10*  CHOLHDL 7.3  VLDL 24  LDLCALC 39    Studies/Results: No results found.  Medications: Scheduled:  sodium chloride   Intravenous Once   aspirin EC  81 mg Oral Daily   cholecalciferol  2,000 Units Oral Daily   clopidogrel  75 mg Oral Daily   cyanocobalamin  500 mcg Oral Daily   enoxaparin (LOVENOX) injection  30 mg Subcutaneous Q24H   feeding supplement  237 mL Oral TID BM   folic acid  1,000 mcg Oral Daily   gabapentin  200 mg Oral TID   insulin aspart  0-15 Units Subcutaneous TID WC   insulin glargine-yfgn  16 Units Subcutaneous Daily   mometasone-formoterol  2 puff Inhalation BID   pantoprazole  40 mg Oral BID   senna  1 tablet Oral BID   simvastatin  20 mg Oral QHS   Assessment: 81 y.o. female with a PMHx of CAD, CKD, arthritis, COPD, DM, HLD, HTN and RLS who was admitted on 11/4 for management of GI bleed, which has resolved, was waiting on SNF rehab.  On Monday, for the first time, patient told staff that she thought she had a stroke because her right hand had been  weak for the past couple of days. 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  - Serial neurological exams with findings that are most consistent with a stroke localizing to the left hemisphere.   - Today she is asking if she can have her CEA during this hospitalization rather than in 1 week, stating "I just want to get it over with and go to therapy".. - Recent TTE from 11/6: LVEF 55 to 60% with normal LV function and no regional wall motion abnormalities, but with mild left ventricular hypertrophy. Left atrial size mildly dilated. The aortic valve is normal in structure. Aortic valve regurgitation is not visualized. Aortic valve sclerosis is present, with no evidence of aortic valve stenosis. No mural thrombus mentioned in the report.   - Also seen on MRI: Moderate chronic microvascular ischemic changes of the white matter.  - MRA head: Motion degraded MR angiogram of the head without evidence of hemodynamically significant stenosis. - Elevated BUN and Cr. Cr now at her baseline in the context of her CKD.   - Carotid ultrasound: Right: Heterogeneous and partially calcified plaque at the right carotid bifurcation contributes to 50%-69% stenosis by established duplex criteria. Left: Heterogeneous and partially calcified plaque at the left carotid bifurcation contributes to 70%-99% stenosis by established duplex criteria - Has been started on ASA. Given her severe symptomatic carotid atherosclerosis, benefits of antiplatelet medication outweigh the risks despite her recent GIB.    Recommendations: - Vascular Surgery is scheduling her for left CEA in 1 week. Patient willing to have CEA earlier, if possible. No contraindications to surgery during this hospitalization from a neurological standpoint as the  likelihood of hemorrhagic conversion of the strokes due to improved perfusion is felt to be low, with  overall benefits of immediate surgery outweighing the risks. Will defer to Vascular Surgery for final decision on timing of the procedure.  - Cardiac telemetry  - BP management. Given the watershed distribution of her strokes, must avoid hypotension. Would maintain a SBP goal of 130-150.  - Encourage PO hydration.  - PT/OT/Speech - Frequent neuro checks - Will need outpatient Neurology follow up.  - Neurohospitalist service will sign off. Please call if there are additional questions.    LOS: 12 days   @Electronically  signed: Dr. 10/29/2022  1:01 PM

## 2022-10-29 NOTE — Plan of Care (Signed)
  Problem: Education: Goal: Knowledge of General Education information will improve Description: Including pain rating scale, medication(s)/side effects and non-pharmacologic comfort measures Outcome: Progressing   Problem: Health Behavior/Discharge Planning: Goal: Ability to manage health-related needs will improve Outcome: Progressing   Problem: Clinical Measurements: Goal: Ability to maintain clinical measurements within normal limits will improve Outcome: Progressing Goal: Will remain free from infection Outcome: Progressing Goal: Diagnostic test results will improve Outcome: Progressing Goal: Respiratory complications will improve Outcome: Progressing Goal: Cardiovascular complication will be avoided Outcome: Progressing   Problem: Activity: Goal: Risk for activity intolerance will decrease Outcome: Progressing   Problem: Elimination: Goal: Will not experience complications related to bowel motility Outcome: Progressing Goal: Will not experience complications related to urinary retention Outcome: Progressing   Problem: Pain Managment: Goal: General experience of comfort will improve Outcome: Progressing   Problem: Safety: Goal: Ability to remain free from injury will improve Outcome: Progressing   

## 2022-10-29 NOTE — Progress Notes (Addendum)
   10/29/22 0815  Vitals  Temp (!) 97.3 F (36.3 C)  Temp Source Oral  BP (!) 135/119  MAP (mmHg) 126  BP Location Left Arm  BP Method Automatic  Patient Position (if appropriate) Sitting  Pulse Rate 78  Pulse Rate Source Monitor  Resp 16  MEWS COLOR  MEWS Score Color Green  Oxygen Therapy  SpO2 98 %  MEWS Score  MEWS Temp 0  MEWS Systolic 0  MEWS Pulse 0  MEWS RR 0  MEWS LOC 0  MEWS Score 0   0930 PRN Hydralazine 10 mg IVP, administered for HTN, will recheck BP    10/29/22 1117  Vitals  Temp 97.6 F (36.4 C)  Temp Source Oral  BP (!) 140/49  MAP (mmHg) 74  BP Method Automatic  Pulse Rate 81  MEWS COLOR  MEWS Score Color Green  Oxygen Therapy  SpO2 97 %  MEWS Score  MEWS Temp 0  MEWS Systolic 0  MEWS Pulse 0  MEWS RR 0  MEWS LOC 0  MEWS Score 0

## 2022-10-29 NOTE — Care Management Important Message (Signed)
Important Message  Patient Details  Name: Kelly Rowland MRN: [id number] Date of Birth: 1941/01/06   Medicare Important Message Given:  Yes     [name] 10/29/2022, 12:47 PM

## 2022-10-30 DIAGNOSIS — D62 Acute posthemorrhagic anemia: Secondary | ICD-10-CM | POA: Diagnosis not present

## 2022-10-30 DIAGNOSIS — I63512 Cerebral infarction due to unspecified occlusion or stenosis of left middle cerebral artery: Secondary | ICD-10-CM | POA: Insufficient documentation

## 2022-10-30 DIAGNOSIS — I63239 Cerebral infarction due to unspecified occlusion or stenosis of unspecified carotid arteries: Secondary | ICD-10-CM | POA: Diagnosis not present

## 2022-10-30 DIAGNOSIS — K264 Chronic or unspecified duodenal ulcer with hemorrhage: Secondary | ICD-10-CM | POA: Diagnosis not present

## 2022-10-30 LAB — BASIC METABOLIC PANEL
Anion gap: 9 (ref 5–15)
BUN: 44 mg/dL — ABNORMAL HIGH (ref 8–23)
CO2: 31 mmol/L (ref 22–32)
Calcium: 8.1 mg/dL — ABNORMAL LOW (ref 8.9–10.3)
Chloride: 95 mmol/L — ABNORMAL LOW (ref 98–111)
Creatinine, Ser: 1.13 mg/dL — ABNORMAL HIGH (ref 0.44–1.00)
GFR, Estimated: 49 mL/min — ABNORMAL LOW (ref >60)
Glucose, Bld: 157 mg/dL — ABNORMAL HIGH (ref 70–99)
Potassium: 3.7 mmol/L (ref 3.5–5.1)
Sodium: 135 mmol/L (ref 135–145)

## 2022-10-30 LAB — CBC
HCT: 24.6 % — ABNORMAL LOW (ref 36.0–46.0)
Hemoglobin: 7.8 g/dL — ABNORMAL LOW (ref 12.0–15.0)
MCH: 28 pg (ref 26.0–34.0)
MCHC: 31.7 g/dL (ref 30.0–36.0)
MCV: 88.2 fL (ref 80.0–100.0)
Platelets: 371 10*3/uL (ref 150–400)
RBC: 2.79 MIL/uL — ABNORMAL LOW (ref 3.87–5.11)
RDW: 16.1 % — ABNORMAL HIGH (ref 11.5–15.5)
WBC: 17.7 10*3/uL — ABNORMAL HIGH (ref 4.0–10.5)
nRBC: 0 % (ref 0.0–0.2)

## 2022-10-30 LAB — GLUCOSE, CAPILLARY
Glucose-Capillary: 147 mg/dL — ABNORMAL HIGH (ref 70–99)
Glucose-Capillary: 286 mg/dL — ABNORMAL HIGH (ref 70–99)
Glucose-Capillary: 176 mg/dL — ABNORMAL HIGH (ref 70–99)
Glucose-Capillary: 233 mg/dL — ABNORMAL HIGH (ref 70–99)

## 2022-10-30 LAB — MAGNESIUM: Magnesium: 2.5 mg/dL — ABNORMAL HIGH (ref 1.7–2.4)

## 2022-10-30 LAB — HEMOGLOBIN
Hemoglobin: 8 g/dL — ABNORMAL LOW (ref 12.0–15.0)
Hemoglobin: 7.7 g/dL — ABNORMAL LOW (ref 12.0–15.0)

## 2022-10-30 MED ORDER — PREDNISONE 20 MG PO TABS
20.0000 mg | ORAL_TABLET | Freq: Every day | ORAL | Status: DC
  Administered 2022-10-30 – 2022-11-04 (×6): 20 mg via ORAL
  Filled 2022-10-30 (×6): qty 1

## 2022-10-30 MED ORDER — ATORVASTATIN CALCIUM 20 MG PO TABS
40.0000 mg | ORAL_TABLET | Freq: Every day | ORAL | Status: DC
  Administered 2022-10-30 – 2022-11-03 (×5): 40 mg via ORAL
  Filled 2022-10-30 (×5): qty 2

## 2022-10-30 MED ORDER — OXYCODONE HCL 5 MG PO TABS
5.0000 mg | ORAL_TABLET | ORAL | Status: DC | PRN
  Administered 2022-10-30 – 2022-11-01 (×10): 10 mg via ORAL
  Administered 2022-11-02: 5 mg via ORAL
  Administered 2022-11-02 – 2022-11-04 (×7): 10 mg via ORAL
  Filled 2022-10-30 (×6): qty 2
  Filled 2022-10-30: qty 1
  Filled 2022-10-30 (×11): qty 2

## 2022-10-30 NOTE — Inpatient Diabetes Management (Signed)
Inpatient Diabetes Program Recommendations  AACE/ADA: New Consensus Statement on Inpatient Glycemic Control (2015)  Target Ranges:  Prepandial:   less than 140 mg/dL      Peak postprandial:   less than 180 mg/dL (1-2 hours)      Critically ill patients:  140 - 180 mg/dL   Lab Results  Component Value Date   GLUCAP 286 (H) 10/30/2022   HGBA1C 7.2 (H) 10/17/2022    Review of Glycemic Control  Latest Reference Range & Units 10/29/22 07:44 10/29/22 11:24 10/29/22 11:49 10/29/22 16:50 10/29/22 21:04 10/30/22 08:08 10/30/22 11:49  Glucose-Capillary 70 - 99 mg/dL 366 (H) 440 (H) 347 (H) 170 (H) 194 (H) 147 (H) 286 (H)  (H): Data is abnormally high  Current orders for Inpatient glycemic control:  Novolog 0-15 units TID  Semglee 12 units QD Prednisone 20 mg QD  Inpatient Diabetes Program Recommendations:    Postprandials consistently elevated, Please consider:  Novolog 3 units TID with meals if consumes at least 50%.  Will continue to follow while inpatient.  Thank you, Dulce Sellar, MSN, CDCES Diabetes Coordinator Inpatient Diabetes Program [phone] (team pager from 8a-5p)

## 2022-10-30 NOTE — Progress Notes (Signed)
Physical Therapy Treatment Patient Details Name: Kelly Rowland MRN: [id number] DOB: Mar 31, 1941 Today's Date: 10/30/2022   History of Present Illness Pt is a 81 y/o F admitted on 10/17/22 after presenting to the ED with c/o generalized weakness, dizziness, & black tarry stools x 2 days. Pt is being treated for GI bleed. PMH: DJD, CAD, CKD, DM2, peptic ulcer disease, HTN, COPD. 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    PT Comments    Pt reluctant to participate with PT services initially secondary to BLE foot pain but with education and encouragement pt willing to participate with supine exercises only per below.  Pt required frequent therapeutic rest breaks to address foot pain but ultimately put forth very good effort with below therex. MD notified of pt's continued pain and inability to bear weight.  Pt will benefit from PT services in a SNF setting upon discharge to safely address deficits listed in patient problem list for decreased caregiver assistance and eventual return to PLOF.    Recommendations for follow up therapy are one component of a multi-disciplinary discharge planning process, led by the attending physician.  Recommendations may be updated based on patient status, additional functional criteria and insurance authorization.  Follow Up Recommendations  Skilled nursing-short term rehab (<3 hours/day) Can patient physically be transported by private vehicle: No   Assistance Recommended at Discharge Intermittent Supervision/Assistance  Patient can return home with the following A lot of help with walking and/or transfers;A lot of help with bathing/dressing/bathroom;Assistance with cooking/housework;Direct supervision/assist for medications management;Direct supervision/assist for financial management;Assist for transportation;Help with stairs or ramp for entrance   Equipment Recommendations  None recommended by PT    Recommendations for Other Services       Precautions / Restrictions  Precautions Precautions: Fall Restrictions Weight Bearing Restrictions: No     Mobility  Bed Mobility               General bed mobility comments: Pt declined secondary to bilateral foot pain, agreed to supine therex only    Transfers                        Ambulation/Gait                   Stairs             Wheelchair Mobility    Modified Rankin (Stroke Patients Only)       Balance                                            Cognition Arousal/Alertness: Awake/alert Behavior During Therapy: WFL for tasks assessed/performed Overall Cognitive Status: Within Functional Limits for tasks assessed                                          Exercises Total Joint Exercises Ankle Circles/Pumps: AROM, Both, 5 reps, 10 reps (minimal on the R secondary to pain) Quad Sets: Strengthening, Both, 5 reps, 10 reps Gluteal Sets: Strengthening, Both, 5 reps, 10 reps Towel Squeeze: Strengthening, Both, 10 reps Heel Slides: AROM, Strengthening, Both, 10 reps Hip ABduction/ADduction: Strengthening, Both, 10 reps Straight Leg Raises: Strengthening, Both, 10 reps Bridges: Strengthening, Both, Other reps (comment) (3 x 3 reps) Other Exercises Other  Exercises: pt education on physiological benefits of activity Other Exercises: HEP education for BLE APs to tolerance, QS, GS x 10 each every 1-2 hours; BLE SLR, hip Abd, and bridges x 5-10 reps 2x/day    General Comments        Pertinent Vitals/Pain Pain Assessment Pain Assessment: 0-10 Pain Score: 7  Pain Location: B feet Pain Descriptors / Indicators: Aching, Sharp, Shooting Pain Intervention(s): Premedicated before session, Monitored during session, Repositioned, Limited activity within patient's tolerance    Home Living                          Prior Function            PT Goals (current goals can now be found in the care plan section) Progress  towards PT goals: Not progressing toward goals - comment (limited by foot pain, unable to weight bear)    Frequency    Min 2X/week      PT Plan Current plan remains appropriate    Co-evaluation              AM-PAC PT "6 Clicks" Mobility   Outcome Measure  Help needed turning from your back to your side while in a flat bed without using bedrails?: A Little Help needed moving from lying on your back to sitting on the side of a flat bed without using bedrails?: A Little Help needed moving to and from a bed to a chair (including a wheelchair)?: Total Help needed standing up from a chair using your arms (e.g., wheelchair or bedside chair)?: Total Help needed to walk in hospital room?: Total Help needed climbing 3-5 steps with a railing? : Total 6 Click Score: 10    End of Session Equipment Utilized During Treatment: Oxygen Activity Tolerance: Patient limited by pain Patient left: in bed;with bed alarm set;with call bell/phone within reach Nurse Communication: Mobility status PT Visit Diagnosis: Muscle weakness (generalized) (M62.81);Unsteadiness on feet (R26.81);Difficulty in walking, not elsewhere classified (R26.2);Pain Pain - Right/Left:  (bilateral feet)     Time: 1019-1050 PT Time Calculation (min) (ACUTE ONLY): 31 min  Charges:  $Therapeutic Exercise: 23-37 mins                     D. Scott Lurlean Kernen PT, DPT 10/30/22, 11:08 AM

## 2022-10-30 NOTE — Progress Notes (Signed)
Progress Note   Patient: Kelly Rowland [id number] DOB: 1941/07/01 DOA: 10/17/2022     13 DOS: the patient was seen and examined on 10/30/2022   Brief hospital course: Kelly Rowland is a 81 y.o. 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  EGD was performed on 11/5, was found to have bleeding friable duodenal mucosa, treated with cauterization.  Patient developed acute respiratory failure with pulmonary edema after fluid resuscitation and blood transfusion.  Was treated with IV Lasix, echocardiogram showed normal heart function.   Patient developed acute stroke on 11/12, MRI showed acute/subacute infarcts in the left centrum semiovale, corona radiata and basal ganglia region in a watershed type distribution.  Neurology recommend aspirin 81 mg daily, avoid hypotension.  Carotid ultrasound showed 70 to 99% stenosis in the right internal carotid arteries.  Vascular surgery consulted on 11/15, scheduled intervention on Monday. Patient also treated steroids for possible acute gout with bilateral foot pain and ankle pain.   Assessment and Plan: [name], resolved Duodenal ulcer with GI bleed. Acute blood loss anemia. Patient is status post EGD, cauterization of bleeding.  Condition has improved, hemoglobin has been stabilized. Discussed with Dr. Tobi Bastos,  patient patient will need aspirin, Plavix and statin for carotid artery stent placement.  He is okay to restart Plavix.  However, patient hemoglobin dropped again to 7.8 after just 1 dose of Plavix yesterday, patient is too risky to start Plavix.  We will continue monitor hemoglobin, transfuse as needed.   acute hypoxic respiratory failure suspect acute pulmonary edema with volume overload PNA ruled in --  developed hypoxia on 11/5.  chest x-ray showed bilateral pulmonary edema.  Likely from fluid overload from IVF and blood transfusion --O2 requirement went up to 4L morning of 11/11, CXR suggested interstitial edema or interstitial PNA.  Procal increased.  Started on IV levaquin. Condition improving, Levaquin is completed.   Acute stroke, not POA Carotid stenosis. --pt first reported weakness in right hand on 11/12, symptom had been present for about 2 days. --MRI brain 11/13 showed Acute/subacute infarcts in the left centrum semiovale, corona radiata and basal ganglia region in a watershed type distribution. Discussed with Dr. Wyn Quaker, patient is not able to tolerate Plavix.  Still planning intervention on Monday, but no stent.   Bilateral foot and ankle pain Possible acute gout --this was present prior to arrival, intermittent with varying severity, and pt already had extensive outpatient workup with suggestions of gout, swelling or nerve as etiology of the pain.  Had MRI left ankle on 08/31/22, however, currently complained of severe right ankle pain. --pt was able to walk with PT on 10/18/22, but since around 10/22/22, pt has been complaining of severe bilateral foot pain and inability to tolerate any dorsi-flexion, let along bear weight on her feet.  Both ankles however look normal without erythema or edema. Patient now complaining more pain in her legs, not able to stand up.  Looks like we need to restart steroids.   CHF ruled out Echo showed normal systolic and diastolic function   Chest pain, ACS ruled out history of CAD status post stent --trop neg.  Cardiology consulted, ruled out ACS.   Hypokalemia Hyponatremia Hypophosphatemia resolved.  CKD stage IIIb Renal function still stable, potassium and magnesium has normalized.   Essential hypertension --Given the watershed distribution  of her strokes, must avoid hypotension.  Would maintain a SBP goal of 130-150 for now.  --hold home  coreg and amlodipine   DM2 with hypoglycemia and hyperglycemia  Continue long-acting insulin and sliding scale insulin.   Chronic back pain on chronic opioids Continue as needed pain medicine   Dysuria, resolved --resolved without abx.     Fever of unknown etiology, resolved --has intermittent fevers.  Blood cx neg so far.  Urine cx only 30,000 colonies.  COVID and RVP neg.       Subjective:  Patient is complaining of leg and ankle pain today, worsening than yesterday.  She did not notice any black stool.  Physical Exam: Vitals:   10/29/22 1919 10/29/22 2049 10/30/22 0313 10/30/22 0855  BP: (!) 144/51  (!) 136/48 (!) 144/50  Pulse: 82  74 71  Resp: 20 17 20 18   Temp: 98.6 F (37 C)  98.2 F (36.8 C) 98.6 F (37 C)  TempSrc: Oral  Oral Oral  SpO2: 96%  94% 97%  Weight:      Height:       General exam: Appears calm and comfortable  Respiratory system: Clear to auscultation. Respiratory effort normal. Cardiovascular system: S1 & S2 heard, RRR. No JVD, murmurs, rubs, gallops or clicks. No pedal edema. Gastrointestinal system: Abdomen is nondistended, soft and nontender. No organomegaly or masses felt. Normal bowel sounds heard. Central nervous system: Alert and oriented. No focal neurological deficits. Extremities: Symmetric 5 x 5 power. Skin: No rashes, lesions or ulcers Psychiatry:  Mood & affect appropriate.   Data Reviewed:  Lab results reviewed.  Family Communication:   Disposition: Status is: Inpatient Remains inpatient appropriate because: Verity of disease, pending inpatient procedure.  Planned Discharge Destination: Skilled nursing facility    Time spent: 35 minutes  Author: , MD 10/30/2022 10:53 AM  For on call review [email or web address].

## 2022-10-30 NOTE — Plan of Care (Signed)
  Problem: Clinical Measurements: Goal: Will remain free from infection Outcome: Progressing   Problem: Clinical Measurements: Goal: Diagnostic test results will improve Outcome: Progressing   Problem: Clinical Measurements: Goal: Respiratory complications will improve Outcome: Progressing   Problem: Activity: Goal: Risk for activity intolerance will decrease Outcome: Progressing   Problem: Coping: Goal: Level of anxiety will decrease Outcome: Progressing   

## 2022-10-30 NOTE — Progress Notes (Signed)
Occupational Therapy Treatment Patient Details Name: Kelly Rowland MRN: [id number] DOB: 07/18/1941 Today's Date: 10/30/2022   History of present illness Pt is a 81 y/o F admitted on 10/17/22 after presenting to the ED with c/o generalized weakness, dizziness, & black tarry stools x 2 days. Pt is being treated for GI bleed. PMH: DJD, CAD, CKD, DM2, peptic ulcer disease, HTN, COPD. 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   OT comments  Pt seen for OT tx this date. With encouragement, pt agreeable to bed mobility which she completes with supervision and +time/effort/use of bed rails - a slight improvement from previous therapy session per chart. Pt tolerated sitting EOB for ~80min with intermittent UE support on the EOB, requiring MAX A for slight lateral scoots. Pt unable to tolerate any pressure on her feet by touching the floor. Pt encouraged to support more upright positioning in bed to support her lung function. Pt verbalized understanding. Continues to benefit from skilled OT. SNF remains appropriate.    Recommendations for follow up therapy are one component of a multi-disciplinary discharge planning process, led by the attending physician.  Recommendations may be updated based on patient status, additional functional criteria and insurance authorization.    Follow Up Recommendations  Skilled nursing-short term rehab (<3 hours/day)     Assistance Recommended at Discharge Frequent or constant Supervision/Assistance  Patient can return home with the following  A lot of help with bathing/dressing/bathroom;A lot of help with walking and/or transfers;Assistance with cooking/housework;Assist for transportation;Help with stairs or ramp for entrance;Assistance with feeding;Direct supervision/assist for medications management;Direct supervision/assist for financial management   Equipment Recommendations  Other (comment) (defer to next venue)    Recommendations for Other Services      Precautions /  Restrictions Precautions Precautions: Fall Restrictions Weight Bearing Restrictions: No       Mobility Bed Mobility Overal bed mobility: Needs Assistance Bed Mobility: Sit to Supine, Supine to Sit     Supine to sit: Supervision, HOB elevated Sit to supine: Supervision   General bed mobility comments: +time, effort, use of bed rails but is able to complete without assist    Transfers                   General transfer comment: declined 2/2 bilat foot pain     Balance Overall balance assessment: Needs assistance Sitting-balance support: Single extremity supported, Feet unsupported Sitting balance-Leahy Scale: Fair Sitting balance - Comments: a bit unsteady but able to self correct slight balance deficits with supervision                                   ADL either performed or assessed with clinical judgement   ADL Overall ADL's : Needs assistance/impaired     Grooming: Sitting;Set up;Supervision/safety                                      Extremity/Trunk Assessment              Vision       Perception     Praxis      Cognition Arousal/Alertness: Awake/alert Behavior During Therapy: WFL for tasks assessed/performed Overall Cognitive Status: Within Functional Limits for tasks assessed  Exercises Other Exercises Other Exercises: Pt educated in positioning and strategies to improve activity tolerance    Shoulder Instructions       General Comments      Pertinent Vitals/ Pain       Pain Assessment Pain Assessment: 0-10 Pain Score: 7  Pain Location: B feet/legs, back Pain Descriptors / Indicators: Constant, Sharp, Stabbing, Aching, Grimacing, Guarding Pain Intervention(s): Limited activity within patient's tolerance, Monitored during session, Repositioned, Patient requesting pain meds-RN notified  Home Living                                           Prior Functioning/Environment              Frequency  Min 2X/week        Progress Toward Goals  OT Goals(current goals can now be found in the care plan section)  Progress towards OT goals: Progressing toward goals  Acute Rehab OT Goals Patient Stated Goal: get stronger OT Goal Formulation: With patient Time For Goal Achievement: 11/10/22 Potential to Achieve Goals: Good  Plan Discharge plan remains appropriate;Frequency remains appropriate    Co-evaluation                 AM-PAC OT "6 Clicks" Daily Activity     Outcome Measure   Help from another person eating meals?: None Help from another person taking care of personal grooming?: A Little Help from another person toileting, which includes using toliet, bedpan, or urinal?: A Lot Help from another person bathing (including washing, rinsing, drying)?: A Lot Help from another person to put on and taking off regular upper body clothing?: A Little Help from another person to put on and taking off regular lower body clothing?: A Lot 6 Click Score: 16    End of Session Equipment Utilized During Treatment: Oxygen  OT Visit Diagnosis: Unsteadiness on feet (R26.81);Repeated falls (R29.6);Muscle weakness (generalized) (M62.81);Pain;Other symptoms and signs involving the nervous system (R29.898) Pain - Right/Left:  (bilat) Pain - part of body: Ankle and joints of foot;Leg   Activity Tolerance Patient limited by pain   Patient Left in bed;with call bell/phone within reach;with bed alarm set   Nurse Communication Patient requests pain meds        Time: 3734-2876 OT Time Calculation (min): 13 min  Charges: OT General Charges $OT Visit: 1 Visit OT Treatments $Self Care/Home Management : 8-22 mins  Arman Filter., MPH, MS, OTR/L ascom [phone] 10/30/22, 9:59 AM

## 2022-10-31 DIAGNOSIS — I63512 Cerebral infarction due to unspecified occlusion or stenosis of left middle cerebral artery: Secondary | ICD-10-CM

## 2022-10-31 DIAGNOSIS — K264 Chronic or unspecified duodenal ulcer with hemorrhage: Secondary | ICD-10-CM | POA: Diagnosis not present

## 2022-10-31 DIAGNOSIS — D62 Acute posthemorrhagic anemia: Secondary | ICD-10-CM | POA: Diagnosis not present

## 2022-10-31 LAB — CBC
HCT: 24 % — ABNORMAL LOW (ref 36.0–46.0)
Hemoglobin: 7.6 g/dL — ABNORMAL LOW (ref 12.0–15.0)
MCH: 27.9 pg (ref 26.0–34.0)
MCHC: 31.7 g/dL (ref 30.0–36.0)
MCV: 88.2 fL (ref 80.0–100.0)
Platelets: 378 10*3/uL (ref 150–400)
RBC: 2.72 MIL/uL — ABNORMAL LOW (ref 3.87–5.11)
RDW: 16.2 % — ABNORMAL HIGH (ref 11.5–15.5)
WBC: 16.4 10*3/uL — ABNORMAL HIGH (ref 4.0–10.5)
nRBC: 0 % (ref 0.0–0.2)

## 2022-10-31 LAB — BASIC METABOLIC PANEL
Anion gap: 9 (ref 5–15)
BUN: 42 mg/dL — ABNORMAL HIGH (ref 8–23)
CO2: 29 mmol/L (ref 22–32)
Calcium: 8 mg/dL — ABNORMAL LOW (ref 8.9–10.3)
Chloride: 97 mmol/L — ABNORMAL LOW (ref 98–111)
Creatinine, Ser: 1.07 mg/dL — ABNORMAL HIGH (ref 0.44–1.00)
GFR, Estimated: 53 mL/min — ABNORMAL LOW (ref >60)
Glucose, Bld: 174 mg/dL — ABNORMAL HIGH (ref 70–99)
Potassium: 3.2 mmol/L — ABNORMAL LOW (ref 3.5–5.1)
Sodium: 135 mmol/L (ref 135–145)

## 2022-10-31 LAB — GLUCOSE, CAPILLARY
Glucose-Capillary: 163 mg/dL — ABNORMAL HIGH (ref 70–99)
Glucose-Capillary: 257 mg/dL — ABNORMAL HIGH (ref 70–99)
Glucose-Capillary: 363 mg/dL — ABNORMAL HIGH (ref 70–99)
Glucose-Capillary: 305 mg/dL — ABNORMAL HIGH (ref 70–99)

## 2022-10-31 LAB — HEMOGLOBIN: Hemoglobin: 7.6 g/dL — ABNORMAL LOW (ref 12.0–15.0)

## 2022-10-31 LAB — MAGNESIUM: Magnesium: 2.4 mg/dL (ref 1.7–2.4)

## 2022-10-31 MED ORDER — POTASSIUM CHLORIDE CRYS ER 20 MEQ PO TBCR
40.0000 meq | EXTENDED_RELEASE_TABLET | ORAL | Status: AC
  Administered 2022-10-31 (×2): 40 meq via ORAL
  Filled 2022-10-31 (×2): qty 2

## 2022-10-31 MED ORDER — LIDOCAINE 5 % EX PTCH
1.0000 | MEDICATED_PATCH | CUTANEOUS | Status: DC
  Administered 2022-10-31 – 2022-11-04 (×5): 1 via TRANSDERMAL
  Filled 2022-10-31 (×5): qty 1

## 2022-10-31 NOTE — Progress Notes (Signed)
Progress Note   Patient: Kelly Rowland [id number]:[id number] DOB: [birth year] DOA: 10/17/2022     14 DOS: the patient was seen and examined on 10/31/2022   Brief hospital course: Kelly Rowland is a 81 y.o. 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  EGD was performed on 11/5, was found to have bleeding friable duodenal mucosa, treated with cauterization.  Patient developed acute respiratory failure with pulmonary edema after fluid resuscitation and blood transfusion.  Was treated with IV Lasix, echocardiogram showed normal heart function.   Patient developed acute stroke on 11/12, MRI showed acute/subacute infarcts in the left centrum semiovale, corona radiata and basal ganglia region in a watershed type distribution.  Neurology recommend aspirin 81 mg daily, avoid hypotension.  Carotid ultrasound showed 70 to 99% stenosis in the right internal carotid arteries.  Vascular surgery consulted on 11/15, scheduled intervention on Monday. Patient also treated steroids for possible acute gout with bilateral foot pain and ankle pain.   Assessment and Plan:  [name], resolved Duodenal ulcer with GI bleed. Acute blood loss anemia. Patient is status post EGD, cauterization of bleeding.  Condition has improved, hemoglobin has been stabilized. Discussed with Dr. Tobi Bastos,  patient patient will need aspirin, Plavix and statin for carotid artery stent placement.  He is okay to restart Plavix.  However, patient hemoglobin dropped again to 7.8 after just 1 dose of Plavix, patient is too risky to start Plavix.   Hemoglobin still low, but stable.  Continue to follow hemoglobin every 8 hours.  Transfuse as needed.  Plavix discontinued.    acute hypoxic respiratory failure suspect  acute pulmonary edema with volume overload PNA ruled in -- developed hypoxia on 11/5.  chest x-ray showed bilateral pulmonary edema.  Likely from fluid overload from IVF and blood transfusion --O2 requirement went up to 4L morning of 11/11, CXR suggested interstitial edema or interstitial PNA.  Procal increased.  Completed 5 days of Levaquin. Condition is more stable, antibiotics completed.   Acute stroke, not POA Carotid stenosis. --pt first reported weakness in right hand on 11/12, symptom had been present for about 2 days. --MRI brain 11/13 showed Acute/subacute infarcts in the left centrum semiovale, corona radiata and basal ganglia region in a watershed type distribution. Discussed with Dr. Wyn Quaker, patient is not able to tolerate Plavix.  Still planning intervention on Monday, but no stent.   Bilateral foot and ankle pain Possible acute gout --this was present prior to arrival, intermittent with varying severity, and pt already had extensive outpatient workup with suggestions of gout, swelling or nerve as etiology of the pain.  Had MRI left ankle on 08/31/22, however, currently complained of severe right ankle pain. --pt was able to walk with PT on 10/18/22, but since around 10/22/22, pt has been complaining of severe bilateral foot pain and inability to tolerate any dorsi-flexion, let along bear weight on her feet.  Both ankles however look normal without erythema or edema. Still complaining significant pain in the right ankle, continue oral steroids, continued pain medicine as needed.  Also added Lidoderm patch.   CHF ruled out Echo showed normal systolic and diastolic function   Chest pain, ACS ruled out history of CAD status post stent --trop neg.  Cardiology consulted, ruled out ACS.   Hypokalemia Hyponatremia Hypophosphatemia resolved.  CKD stage IIIb Potassium  is low again at 3.2, given 2 doses of oral potassium.  Recheck level tomorrow.   Essential hypertension --Given the  watershed distribution of her strokes, must avoid hypotension.  Would maintain a SBP goal of 130-150 for now.  --hold home coreg and amlodipine Blood pressure not elevated.  DM2 with hypoglycemia and hyperglycemia  Glucose is more stable.   Chronic back pain on chronic opioids Continue as needed pain medicine   Dysuria, resolved --resolved without abx.     Fever of unknown etiology, resolved --has intermittent fevers.  Blood cx neg so far.  Urine cx only 30,000 colonies.  COVID and RVP neg.        Subjective:  Patient still complaining of significant right ankle pain, worse with movement. She did not have any black stools.  Physical Exam: Vitals:   10/30/22 1633 10/30/22 1933 10/31/22 0428 10/31/22 0758  BP: (!) 143/56 (!) 127/54 (!) 132/52 109/68  Pulse: 80 74 69 84  Resp: 18 16 20 16   Temp: 98.3 F (36.8 C) 98.7 F (37.1 C) 98.5 F (36.9 C) 98 F (36.7 C)  TempSrc:  Oral Oral Oral  SpO2: 96% 95% 94% 96%  Weight:      Height:       General exam: Appears calm and comfortable  Respiratory system: Clear to auscultation. Respiratory effort normal. Cardiovascular system: S1 & S2 heard, RRR. No JVD, murmurs, rubs, gallops or clicks. No pedal edema. Gastrointestinal system: Abdomen is nondistended, soft and nontender. No organomegaly or masses felt. Normal bowel sounds heard. Central nervous system: Alert and oriented x2. No focal neurological deficits. Extremities: Symmetric 5 x 5 power. Skin: No rashes, lesions or ulcers Psychiatry: Judgement and insight appear normal. Mood & affect appropriate.   Data Reviewed:  Lab results reviewed.  Family Communication: son updated  Disposition: Status is: Inpatient Remains inpatient appropriate because: Severity of disease, pending procedure.  Planned Discharge Destination: Skilled nursing facility    Time spent: 35 minutes  Author: , MD 10/31/2022 10:49 AM  For on call review [email or web address].

## 2022-11-01 DIAGNOSIS — I63512 Cerebral infarction due to unspecified occlusion or stenosis of left middle cerebral artery: Secondary | ICD-10-CM | POA: Diagnosis not present

## 2022-11-01 DIAGNOSIS — K264 Chronic or unspecified duodenal ulcer with hemorrhage: Secondary | ICD-10-CM | POA: Diagnosis not present

## 2022-11-01 DIAGNOSIS — I63239 Cerebral infarction due to unspecified occlusion or stenosis of unspecified carotid arteries: Secondary | ICD-10-CM | POA: Diagnosis not present

## 2022-11-01 LAB — BASIC METABOLIC PANEL
Anion gap: 5 (ref 5–15)
BUN: 36 mg/dL — ABNORMAL HIGH (ref 8–23)
CO2: 30 mmol/L (ref 22–32)
Calcium: 8.2 mg/dL — ABNORMAL LOW (ref 8.9–10.3)
Chloride: 101 mmol/L (ref 98–111)
Creatinine, Ser: 1.02 mg/dL — ABNORMAL HIGH (ref 0.44–1.00)
GFR, Estimated: 56 mL/min — ABNORMAL LOW (ref >60)
Glucose, Bld: 193 mg/dL — ABNORMAL HIGH (ref 70–99)
Potassium: 4.1 mmol/L (ref 3.5–5.1)
Sodium: 136 mmol/L (ref 135–145)

## 2022-11-01 LAB — GLUCOSE, CAPILLARY
Glucose-Capillary: 150 mg/dL — ABNORMAL HIGH (ref 70–99)
Glucose-Capillary: 259 mg/dL — ABNORMAL HIGH (ref 70–99)
Glucose-Capillary: 221 mg/dL — ABNORMAL HIGH (ref 70–99)
Glucose-Capillary: 324 mg/dL — ABNORMAL HIGH (ref 70–99)

## 2022-11-01 LAB — HEMOGLOBIN
Hemoglobin: 7.7 g/dL — ABNORMAL LOW (ref 12.0–15.0)
Hemoglobin: 7.9 g/dL — ABNORMAL LOW (ref 12.0–15.0)

## 2022-11-01 LAB — MAGNESIUM: Magnesium: 2.3 mg/dL (ref 1.7–2.4)

## 2022-11-01 MED ORDER — ONDANSETRON HCL 4 MG/2ML IJ SOLN: 4.0000 mg | Freq: Four times a day (QID) | INTRAMUSCULAR | Status: DC | PRN

## 2022-11-01 MED ORDER — MIDAZOLAM HCL 2 MG/ML PO SYRP: 8.0000 mg | ORAL_SOLUTION | Freq: Once | ORAL | Status: DC | PRN

## 2022-11-01 MED ORDER — DIPHENHYDRAMINE HCL 50 MG/ML IJ SOLN: 50.0000 mg | Freq: Once | INTRAMUSCULAR | Status: DC | PRN

## 2022-11-01 MED ORDER — SODIUM CHLORIDE 0.9 % IV SOLN: INTRAVENOUS | Status: DC

## 2022-11-01 MED ORDER — VANCOMYCIN HCL IN DEXTROSE 1-5 GM/200ML-% IV SOLN
1000.0000 mg | INTRAVENOUS | Status: AC
  Filled 2022-11-01: qty 200

## 2022-11-01 MED ORDER — HYDROMORPHONE HCL 1 MG/ML IJ SOLN: 1.0000 mg | Freq: Once | INTRAMUSCULAR | Status: DC | PRN

## 2022-11-01 MED ORDER — METHYLPREDNISOLONE SODIUM SUCC 125 MG IJ SOLR: 125.0000 mg | Freq: Once | INTRAMUSCULAR | Status: DC | PRN

## 2022-11-01 MED ORDER — FAMOTIDINE 20 MG PO TABS: 40.0000 mg | ORAL_TABLET | Freq: Once | ORAL | Status: DC | PRN

## 2022-11-01 NOTE — Progress Notes (Signed)
Physical Therapy Treatment Patient Details Name: Kelly Rowland MRN: [id number] DOB: 1941/12/03 Today's Date: 11/01/2022   History of Present Illness Pt is a 81 y/o F admitted on 10/17/22 after presenting to the ED with c/o generalized weakness, dizziness, & black tarry stools x 2 days. Pt is being treated for GI bleed. PMH: DJD, CAD, CKD, DM2, peptic ulcer disease, HTN, COPD. 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    PT Comments    Patient supine in bed upon therapist entering.  Patient initially elects not to complete physical therapy activities this date but with some persuasion patient agreed to complete primarily left lower extremity activity secondary to very high right lower extremity pain rating at this time.  Patient performs all supine left lower extremity strengthening as described in note and was able to perform some muscle activation exercises on the right lower extremity without significant pain.  Patient did have pain with even slight touch of left lower extremity with adjustment of bedding.  Patient reports she is having procedure done tomorrow in hopes she will feel better following and her strength may increase.  Patient will benefit from continued physical therapy in the acute setting to maintain her lower extremity strength and prevent hospital related deconditioning.  Patient left in bed with all needs met including call bell and tray table.  Recommendations for follow up therapy are one component of a multi-disciplinary discharge planning process, led by the attending physician.  Recommendations may be updated based on patient status, additional functional criteria and insurance authorization.  Follow Up Recommendations  Skilled nursing-short term rehab (<3 hours/day) Can patient physically be transported by private vehicle: No   Assistance Recommended at Discharge Intermittent Supervision/Assistance  Patient can return home with the following A lot of help with walking and/or  transfers;A lot of help with bathing/dressing/bathroom;Assistance with cooking/housework;Direct supervision/assist for medications management;Direct supervision/assist for financial management;Assist for transportation;Help with stairs or ramp for entrance   Equipment Recommendations  None recommended by PT    Recommendations for Other Services       Precautions / Restrictions Precautions Precautions: Fall Restrictions Weight Bearing Restrictions: No     Mobility  Bed Mobility                    Transfers                        Ambulation/Gait                   Stairs             Wheelchair Mobility    Modified Rankin (Stroke Patients Only)       Balance                                            Cognition Arousal/Alertness: Awake/alert Behavior During Therapy: WFL for tasks assessed/performed Overall Cognitive Status: Within Functional Limits for tasks assessed                                          Exercises Total Joint Exercises Ankle Circles/Pumps: AROM, Left, Supine, 20 reps Quad Sets: Strengthening, Both, 10 reps, Supine Gluteal Sets: Strengthening, Both, 10 reps, Supine Heel Slides: AROM, 10 reps, Left,  Supine Hip ABduction/ADduction: 10 reps, Supine Straight Leg Raises: 10 reps, Supine, Left    General Comments        Pertinent Vitals/Pain Pain Assessment Pain Score: 8  Pain Location: R LE Pain Descriptors / Indicators: Aching, Sharp, Shooting Pain Intervention(s): Monitored during session, Limited activity within patient's tolerance    Home Living Family/patient expects to be discharged to:: Private residence Living Arrangements: Alone Available Help at Discharge: Family;Available PRN/intermittently Type of Home: Apartment Home Access: Level entry       Home Layout: One level Home Equipment: Rollator (4 wheels);Cane - single point;Shower seat;Grab bars -  tub/shower;Grab bars - toilet      Prior Function            PT Goals (current goals can now be found in the care plan section) Acute Rehab PT Goals Patient Stated Goal: feel better. PT Goal Formulation: With patient Time For Goal Achievement: 11/01/22 Potential to Achieve Goals: Good    Frequency    Min 2X/week      PT Plan      Co-evaluation              AM-PAC PT "6 Clicks" Mobility   Outcome Measure  Help needed turning from your back to your side while in a flat bed without using bedrails?: A Little Help needed moving from lying on your back to sitting on the side of a flat bed without using bedrails?: A Little Help needed moving to and from a bed to a chair (including a wheelchair)?: Total Help needed standing up from a chair using your arms (e.g., wheelchair or bedside chair)?: Total Help needed to walk in hospital room?: Total Help needed climbing 3-5 steps with a railing? : Total 6 Click Score: 10    End of Session Equipment Utilized During Treatment: Oxygen Activity Tolerance: Patient limited by pain Patient left: in bed;with bed alarm set;with call bell/phone within reach Nurse Communication: Mobility status PT Visit Diagnosis: Muscle weakness (generalized) (M62.81);Unsteadiness on feet (R26.81);Difficulty in walking, not elsewhere classified (R26.2);Pain     Time: 2585-2778 PT Time Calculation (min) (ACUTE ONLY): 16 min  Charges:  $Therapeutic Exercise: 8-22 mins                     Rivka Barbara PT, DPT     Particia Lather 11/01/2022, 10:51 AM

## 2022-11-01 NOTE — Progress Notes (Signed)
Progress Note   Patient: Kelly Rowland AFB:[id number] DOB: Oct 30, 1941 DOA: 10/17/2022     15 DOS: the patient was seen and examined on 11/01/2022   Brief hospital course: Kelly Rowland is a 81 y.o. 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  EGD was performed on 11/5, was found to have bleeding friable duodenal mucosa, treated with cauterization.  Patient developed acute respiratory failure with pulmonary edema after fluid resuscitation and blood transfusion.  Was treated with IV Lasix, echocardiogram showed normal heart function.   Patient developed acute stroke on 11/12, MRI showed acute/subacute infarcts in the left centrum semiovale, corona radiata and basal ganglia region in a watershed type distribution.  Neurology recommend aspirin 81 mg daily, avoid hypotension.  Carotid ultrasound showed 70 to 99% stenosis in the right internal carotid arteries.  Vascular surgery consulted on 11/15, scheduled intervention on 11/20. Patient also treated steroids for possible acute gout with bilateral foot pain and ankle pain.   Assessment and Plan: [name], resolved Duodenal ulcer with GI bleed. Acute blood loss anemia. Patient is status post EGD, cauterization of bleeding.  Condition has improved, hemoglobin has been stabilized. Discussed with Dr. Tobi Bastos,  patient patient will need aspirin, Plavix and statin for carotid artery stent placement.  He is okay to restart Plavix.  However, patient hemoglobin dropped again to 7.8 after just 1 dose of Plavix, patient is too risky to start Plavix.   Patient hemoglobin has been stabilized but at a lower level.  Continue to follow.  Continue PPI twice a day.     acute hypoxic respiratory failure suspect acute pulmonary  edema with volume overload PNA ruled in -- developed hypoxia on 11/5.  chest x-ray showed bilateral pulmonary edema.  Likely from fluid overload from IVF and blood transfusion --O2 requirement went up to 4L morning of 11/11, CXR suggested interstitial edema or interstitial PNA.  Procal increased.  Completed 5 days of Levaquin. Condition has stabilized, antibiotic completed.   Acute stroke, not POA Carotid stenosis. --pt first reported weakness in right hand on 11/12, symptom had been present for about 2 days. --MRI brain 11/13 showed Acute/subacute infarcts in the left centrum semiovale, corona radiata and basal ganglia region in a watershed type distribution. Discussed with Dr. Wyn Quaker, patient is not able to tolerate Plavix.  Still planning intervention on Monday, but no stent. Surgical intervention tomorrow for carotid stenosis.   Bilateral foot and ankle pain Possible acute gout --this was present prior to arrival, intermittent with varying severity, and pt already had extensive outpatient workup with suggestions of gout, swelling or nerve as etiology of the pain.  Had MRI left ankle on 08/31/22, however, currently complained of severe right ankle pain. --pt was able to walk with PT on 10/18/22, but since around 10/22/22, pt has been complaining of severe bilateral foot pain and inability to tolerate any dorsi-flexion, let along bear weight on her feet.  Both ankles however look normal without erythema or edema. Pain is better today with added Lidoderm patch.  Continue as needed pain medicine and oral steroids with prednisone 20 mg daily.   CHF ruled out Echo showed normal systolic and diastolic function   Chest pain, ACS ruled out history of CAD status post stent --trop neg.  Cardiology consulted, ruled out ACS.   Hypokalemia Hyponatremia Hypophosphatemia  resolved.  CKD stage IIIb Potassium is low again at 3.2, given 2 doses of oral potassium.  Recheck level tomorrow.   Essential  hypertension --Given the watershed distribution of her strokes, must avoid hypotension.  Would maintain a SBP goal of 130-150 for now.  --hold home coreg and amlodipine Blood pressure not elevated.   DM2 with hypoglycemia and hyperglycemia  No need to adjust the medications.   Chronic back pain on chronic opioids Continue as needed pain medicine   Dysuria, resolved --resolved without abx.     Fever of unknown etiology, resolved --has intermittent fevers.  Blood cx neg so far.  Urine cx only 30,000 colonies.  COVID and RVP neg.      Subjective:  Patient feels leg pain is better.  Still has abdominal discomfort before meals.  No nausea vomiting.  Physical Exam: Vitals:   10/31/22 1445 10/31/22 1954 11/01/22 0339 11/01/22 0737  BP: (!) 138/53 (!) 136/52 (!) 152/53 (!) 154/55  Pulse: 87 82 72 75  Resp: 18 18 18 16   Temp: 98.3 F (36.8 C) 97.8 F (36.6 C) 97.9 F (36.6 C) 98.7 F (37.1 C)  TempSrc:  Oral Oral Oral  SpO2: 95% 96% 97% 97%  Weight:      Height:       General exam: Appears calm and comfortable  Respiratory system: Clear to auscultation. Respiratory effort normal. Cardiovascular system: S1 & S2 heard, RRR. No JVD, murmurs, rubs, gallops or clicks. No pedal edema. Gastrointestinal system: Abdomen is nondistended, soft and nontender. No organomegaly or masses felt. Normal bowel sounds heard. Central nervous system: Alert and oriented. No focal neurological deficits. Extremities: Symmetric 5 x 5 power. Skin: No rashes, lesions or ulcers Psychiatry: Mood & affect appropriate.   Data Reviewed:  Lab results reviewed.  Family Communication: None  Disposition: Status is: Inpatient Remains inpatient appropriate because: Severity of disease, pending inpatient procedure tomorrow.  Planned Discharge Destination: Skilled nursing facility    Time spent: 35 minutes  Author: , MD 11/01/2022 10:21 AM  For on call review [email or web address].

## 2022-11-02 ENCOUNTER — Encounter
Admission: EM | Disposition: A | Discharge status: Skilled Nursing Facility | Payer: Self-pay | Source: Home / Self Care | Attending: Internal Medicine

## 2022-11-02 ENCOUNTER — Encounter: Payer: Self-pay | Admitting: Vascular Surgery

## 2022-11-02 DIAGNOSIS — I63239 Cerebral infarction due to unspecified occlusion or stenosis of unspecified carotid arteries: Secondary | ICD-10-CM | POA: Diagnosis not present

## 2022-11-02 DIAGNOSIS — I6523 Occlusion and stenosis of bilateral carotid arteries: Secondary | ICD-10-CM

## 2022-11-02 DIAGNOSIS — K264 Chronic or unspecified duodenal ulcer with hemorrhage: Secondary | ICD-10-CM | POA: Diagnosis not present

## 2022-11-02 DIAGNOSIS — N189 Chronic kidney disease, unspecified: Secondary | ICD-10-CM

## 2022-11-02 DIAGNOSIS — I63512 Cerebral infarction due to unspecified occlusion or stenosis of left middle cerebral artery: Secondary | ICD-10-CM | POA: Diagnosis not present

## 2022-11-02 HISTORY — PX: CAROTID ANGIOGRAPHY: CATH118230

## 2022-11-02 LAB — BASIC METABOLIC PANEL
Anion gap: 6 (ref 5–15)
BUN: 30 mg/dL — ABNORMAL HIGH (ref 8–23)
CO2: 30 mmol/L (ref 22–32)
Calcium: 8.2 mg/dL — ABNORMAL LOW (ref 8.9–10.3)
Chloride: 101 mmol/L (ref 98–111)
Creatinine, Ser: 1.02 mg/dL — ABNORMAL HIGH (ref 0.44–1.00)
GFR, Estimated: 56 mL/min — ABNORMAL LOW (ref >60)
Glucose, Bld: 186 mg/dL — ABNORMAL HIGH (ref 70–99)
Potassium: 4 mmol/L (ref 3.5–5.1)
Sodium: 137 mmol/L (ref 135–145)

## 2022-11-02 LAB — GLUCOSE, CAPILLARY
Glucose-Capillary: 163 mg/dL — ABNORMAL HIGH (ref 70–99)
Glucose-Capillary: 197 mg/dL — ABNORMAL HIGH (ref 70–99)
Glucose-Capillary: 272 mg/dL — ABNORMAL HIGH (ref 70–99)
Glucose-Capillary: 217 mg/dL — ABNORMAL HIGH (ref 70–99)

## 2022-11-02 LAB — HEMOGLOBIN
Hemoglobin: 7.5 g/dL — ABNORMAL LOW (ref 12.0–15.0)
Hemoglobin: 7.1 g/dL — ABNORMAL LOW (ref 12.0–15.0)

## 2022-11-02 SURGERY — CAROTID ANGIOGRAPHY
Anesthesia: Moderate Sedation | Laterality: Bilateral

## 2022-11-02 MED ORDER — FENTANYL CITRATE PF 50 MCG/ML IJ SOSY
PREFILLED_SYRINGE | INTRAMUSCULAR | Status: AC
  Filled 2022-11-02: qty 1

## 2022-11-02 MED ORDER — HEPARIN SODIUM (PORCINE) 1000 UNIT/ML IJ SOLN
INTRAMUSCULAR | Status: DC | PRN
  Administered 2022-11-02: 4000 [IU] via INTRAVENOUS

## 2022-11-02 MED ORDER — IODIXANOL 320 MG/ML IV SOLN
INTRAVENOUS | Status: DC | PRN
  Administered 2022-11-02: 45 mL

## 2022-11-02 MED ORDER — VANCOMYCIN HCL IN DEXTROSE 1-5 GM/200ML-% IV SOLN
INTRAVENOUS | Status: AC
  Administered 2022-11-02: 1000 mg via INTRAVENOUS
  Filled 2022-11-02: qty 200

## 2022-11-02 MED ORDER — HEPARIN SODIUM (PORCINE) 1000 UNIT/ML IJ SOLN
INTRAMUSCULAR | Status: AC
  Filled 2022-11-02: qty 10

## 2022-11-02 MED ORDER — MIDAZOLAM HCL 2 MG/2ML IJ SOLN
INTRAMUSCULAR | Status: DC | PRN
  Administered 2022-11-02: 1 mg via INTRAVENOUS

## 2022-11-02 MED ORDER — FENTANYL CITRATE (PF) 100 MCG/2ML IJ SOLN
INTRAMUSCULAR | Status: DC | PRN
  Administered 2022-11-02 (×2): 50 ug via INTRAVENOUS

## 2022-11-02 MED ORDER — MIDAZOLAM HCL 2 MG/2ML IJ SOLN
INTRAMUSCULAR | Status: AC
  Filled 2022-11-02: qty 2

## 2022-11-02 SURGICAL SUPPLY — 13 items
CATH ANGIO 5F PIGTAIL 100CM (CATHETERS) IMPLANT
CATH BEACON 5 .035 100 JB2 TIP (CATHETERS) IMPLANT
CATH HEADHUNTER H1 5F 100CM (CATHETERS) IMPLANT
COVER PROBE ULTRASOUND 5X96 (MISCELLANEOUS) IMPLANT
DEVICE STARCLOSE SE CLOSURE (Vascular Products) IMPLANT
DEVICE TORQUE .025-.038 (MISCELLANEOUS) IMPLANT
DRAPE C-ARM 35X43 STRL (DRAPES) IMPLANT
GLIDEWIRE ANGLED SS 035X260CM (WIRE) IMPLANT
KIT CAROTID MANIFOLD (MISCELLANEOUS) IMPLANT
PACK ANGIOGRAPHY (CUSTOM PROCEDURE TRAY) ×1 IMPLANT
SHEATH BRITE TIP 6FRX11 (SHEATH) IMPLANT
SYR MEDRAD MARK 7 150ML (SYRINGE) IMPLANT
WIRE GUIDERIGHT .035X150 (WIRE) IMPLANT

## 2022-11-02 NOTE — Progress Notes (Addendum)
Progress Note   Patient: Kelly Rowland UXN:[id number] DOB: 03-02-1941 DOA: 10/17/2022     16 DOS: the patient was seen and examined on 11/02/2022   Brief hospital course: Kelly Rowland is a 81 y.o. 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  EGD was performed on 11/5, was found to have bleeding friable duodenal mucosa, treated with cauterization.  Patient developed acute respiratory failure with pulmonary edema after fluid resuscitation and blood transfusion.  Was treated with IV Lasix, echocardiogram showed normal heart function.   Patient developed acute stroke on 11/12, MRI showed acute/subacute infarcts in the left centrum semiovale, corona radiata and basal ganglia region in a watershed type distribution.  Neurology recommend aspirin 81 mg daily, avoid hypotension.  Carotid ultrasound showed 70 to 99% stenosis in the right internal carotid arteries.  Vascular surgery consulted on 11/15, scheduled intervention on 11/20. Patient also treated steroids for possible acute gout with bilateral foot pain and ankle pain.   Assessment and Plan:  [name], resolved Duodenal ulcer with GI bleed. Acute blood loss anemia. Patient is status post EGD, cauterization of bleeding.  Condition has improved, hemoglobin has been stabilized. Discussed with Dr. Tobi Bastos,  patient patient will need aspirin, Plavix and statin for carotid artery stent placement.  He is okay to restart Plavix.  However, patient hemoglobin dropped again to 7.8 after just 1 dose of Plavix, patient is too risky to start Plavix.   Hemoglobin still relatively stable, no black stools.  Patient going to surgery today, recheck hemoglobin at p.m., transfuse as needed.  Continue PPI.     acute hypoxic  respiratory failure suspect acute pulmonary edema with volume overload PNA ruled in -- developed hypoxia on 11/5.  chest x-ray showed bilateral pulmonary edema.  Likely from fluid overload from IVF and blood transfusion --O2 requirement went up to 4L morning of 11/11, CXR suggested interstitial edema or interstitial PNA.  Procal increased.  Completed 5 days of Levaquin. Condition has stabilized, antibiotic completed.   Acute stroke, not POA Carotid stenosis. --pt first reported weakness in right hand on 11/12, symptom had been present for about 2 days. --MRI brain 11/13 showed Acute/subacute infarcts in the left centrum semiovale, corona radiata and basal ganglia region in a watershed type distribution. Discussed with Dr. Wyn Quaker, patient is not able to tolerate Plavix.  Still planning intervention on Monday, but no stent. Patient will be needing surgery today.  Follow-up with CBC postop.   Bilateral foot and ankle pain Possible acute gout --this was present prior to arrival, intermittent with varying severity, and pt already had extensive outpatient workup with suggestions of gout, swelling or nerve as etiology of the pain.  Had MRI left ankle on 08/31/22, however, currently complained of severe right ankle pain. --pt was able to walk with PT on 10/18/22, but since around 10/22/22, pt has been complaining of severe bilateral foot pain and inability to tolerate any dorsi-flexion, let along bear weight on her feet.  Both ankles however look normal without erythema or edema. Pain is better today with added Lidoderm patch.  Continue as needed pain medicine and oral steroids  Patient doing better today.  CHF ruled out Echo showed normal systolic and diastolic function   Chest pain, ACS ruled out history of CAD status post stent --trop neg.  Cardiology consulted,  ruled out ACS.   Hypokalemia Hyponatremia Hypophosphatemia resolved.  CKD stage IIIb Potassium is low again at 3.2, given 2 doses of  oral potassium.  Recheck level tomorrow.   Essential hypertension --Given the watershed distribution of her strokes, must avoid hypotension.  Would maintain a SBP goal of 130-150 for now.  --hold home coreg and amlodipine Blood pressure not elevated.   DM2 with hypoglycemia and hyperglycemia  No need to adjust the medications.   Chronic back pain on chronic opioids Continue as needed pain medicine   Dysuria, resolved --resolved without abx.     Fever of unknown etiology, resolved --has intermittent fevers.  Blood cx neg so far.  Urine cx only 30,000 colonies.  COVID and RVP neg.        Subjective:  Patient doing better today with the pain.  Denies any short of breath.  Physical Exam: Vitals:   11/02/22 1155 11/02/22 1210 11/02/22 1215 11/02/22 1220  BP: (!) 163/51     Pulse:      Resp: 16     Temp:      TempSrc:      SpO2: 93% 96% 97% 97%  Weight:      Height:       General exam: Appears calm and comfortable  Respiratory system: Clear to auscultation. Respiratory effort normal. Cardiovascular system: S1 & S2 heard, RRR. No JVD, murmurs, rubs, gallops or clicks. No pedal edema. Gastrointestinal system: Abdomen is nondistended, soft and nontender. No organomegaly or masses felt. Normal bowel sounds heard. Central nervous system: Alert and oriented x2. No focal neurological deficits. Extremities: Symmetric 5 x 5 power. Skin: No rashes, lesions or ulcers Psychiatry:  Mood & affect appropriate. ' Data Reviewed:  Lab results reviewed.  Family Communication: son updated  Disposition: Status is: Inpatient Remains inpatient appropriate because: Severity of disease, inpatient procedure.  Planned Discharge Destination: Skilled nursing facility    Time spent: 30 minutes  Author: Marrion Coy, MD 11/02/2022 12:28 PM  For on call review [email or web address].

## 2022-11-02 NOTE — Inpatient Diabetes Management (Signed)
Inpatient Diabetes Program Recommendations  AACE/ADA: New Consensus Statement on Inpatient Glycemic Control   Target Ranges:  Prepandial:   less than 140 mg/dL      Peak postprandial:   less than 180 mg/dL (1-2 hours)      Critically ill patients:  140 - 180 mg/dL    Latest Reference Range & Units 11/01/22 07:42 11/01/22 11:23 11/01/22 16:42 11/01/22 21:02 11/02/22 07:42 11/02/22 10:48  Glucose-Capillary 70 - 99 mg/dL 485 (H) 462 (H) 703 (H) 324 (H) 163 (H) 197 (H)    Latest Reference Range & Units 10/31/22 07:59 10/31/22 11:54 10/31/22 16:15 10/31/22 21:12  Glucose-Capillary 70 - 99 mg/dL 500 (H) 938 (H) 182 (H) 305 (H)   Review of Glycemic Control  Diabetes history: DM2 Outpatient Diabetes medications: NPH 24 units daily, Amaryl 4 mg daily Current orders for Inpatient glycemic control: Semglee 12 units daily, Novolog 0-15 units TID with meals; Prednisone 20 units QAM  Inpatient Diabetes Program Recommendations:    Insulin: Post prandial glucose is consistently elevated. If steroids continued as ordered, please consider ordering Novolog 3 units TID with meals for meal coverage if patient eats at least 50% of meals.  Thanks, Orlando Penner, RN, MSN, CDCES Diabetes Coordinator Inpatient Diabetes Program [phone] (Team Pager from 8am to 5pm)

## 2022-11-02 NOTE — Care Management Important Message (Signed)
Important Message  Patient Details  Name: Kelly Rowland MRN: [id number] Date of Birth: 27-Apr-1941   Medicare Important Message Given:  Yes     [name] 11/02/2022, 11:01 AM

## 2022-11-02 NOTE — Interval H&P Note (Signed)
History and Physical Interval Note:  11/02/2022 11:42 AM  Kelly Rowland  has presented today for surgery, with the diagnosis of Carotid Stenosis.  The various methods of treatment have been discussed with the patient and family. After consideration of risks, benefits and other options for treatment, the patient has consented to  Procedure(s): CAROTID ANGIOGRAPHY (Bilateral) as a surgical intervention.  The patient's history has been reviewed, patient examined, no change in status, stable for surgery.  I have reviewed the patient's chart and labs.  Questions were answered to the patient's satisfaction.     Festus Barren

## 2022-11-02 NOTE — Op Note (Signed)
[facility]   OPERATIVE NOTE  DATE: 11/02/2022  PRE-OPERATIVE DIAGNOSIS: 1. High grade left, less severe right carotid artery stenosis with recent left hemispheric stroke 2. Chronic kidney disease precluding CT angiogram 3. GI bleed  POST-OPERATIVE DIAGNOSIS: Same as above  PROCEDURE: 1.   Ultrasound Guidance for vascular access right femoral artery 2.   Catheter placement into right common carotid artery and into left common carotid artery from right femoral approach 3.   Thoracic aortogram 4.   Cervical and cerebral bilateral carotid angiograms 5.   StarClose closure device right femoral artery  SURGEON: Festus Barren, MD  ASSISTANT(S): None  ANESTHESIA: Moderate conscious sedation  ESTIMATED BLOOD LOSS: 5 cc  FLUORO TIME: 5.3 minutes  CONTRAST: 45 cc  MODERATE CONSCIOUS SEDATION TIME: Approximately 32 minutes using 1 of Versed and 100 mcg of Fentanyl  FINDING(S): 1.  30 to 40% right carotid artery stenosis although it was somewhat ulcerative.  Significant right to left cross-filling with no intracranial deficits. 2.  95% or greater stenosis of the left internal carotid artery at the origin.  Less than 50% stenosis of the proximal left common carotid artery although it was highly calcific.  Sluggish intracranial filling with no anterior cerebral filling.  SPECIMEN(S):  None  INDICATIONS:   Patient is a 81 y.o.female who presents with recent stroke and duplex suggesting high-grade left carotid artery stenosis and a lesser degree of right carotid artery stenosis.  She is also had some GI bleeding cannot tolerate Plavix. The patient's renal function precluded CT angiogram. Catheter-based angiogram is performed for further evaluation. Risks and benefits are discussed and informed consent was obtained.  DESCRIPTION: After obtaining full informed written consent, the patient was brought back to the operating room and placed supine upon the vascular suite  table.  After obtaining adequate anesthesia, the patient was prepped and draped in the standard fashion.  Moderate conscious sedation was administered during a face to face encounter with the patient throughout the procedure with my supervision of the RN administering medicines and monitoring the patients vital signs and mental status throughout from the start of the procedure until the patient was taken to the recovery room. The right femoral artery was visualized with ultrasound and found to be calcific but patent. It was then accessed under direct ultrasound guidance without difficulty with a Seldinger needle. A J-wire and 5 French sheath were placed and a permanent image was recorded. The patient was given 4000 units of intravenous heparin. A pigtail catheter was placed into the ascending aorta and an LAO projection thoracic aortogram was performed. This showed a type IIb aortic arch with calcific disease in the proximal left common carotid artery of less than 50%.  I then selected a headhunter catheter and cannulated the innominate artery advancing into the mid right common carotid artery. Cervical and cerebral carotid angiography were then performed on the right side initially. The intracranial flow was found to be brisk with strong cross-filling right to left and no intracranial deficits. The cervical carotid artery was ulcerated and somewhat diseased but only in the 30 to 40% range. I then turned my attention back to the thoracic aorta and removed the catheter from the right side. I selectively cannulated the left common carotid artery without difficulty with a JB2 catheter and advanced into the proximal left common carotid artery. Selective imaging was then performed of the cervical and cerebral carotid artery on the left. Intracranial filling was sluggish and only some middle cerebral filling  was seen. The cervical carotid artery was highly stenotic with essentially a string sign and a 95% or greater  stenosis in the proximal left internal carotid artery. Multiple views were taken in the cervical carotid artery bilaterally. At this point, we had imaging to plan our treatment and we elected to terminate the procedure. The diagnostic catheter was removed. Oblique arteriogram was performed of the right femoral artery and StarClose closure device was deployed in usual fashion with excellent hemostatic result. The patient tolerated the procedure well and was taken to the recovery room in stable condition.  COMPLICATIONS: None  CONDITION: Stable   Festus Barren 11/02/2022 12:37 PM   This note was created with Dragon Medical transcription system. Any errors in dictation are purely unintentional.

## 2022-11-02 NOTE — TOC Progression Note (Addendum)
Transition of Care [facility]) - Progression Note    Patient Details  Name: Kelly Rowland MRN: [id number] Date of Birth: August 24, 1941  Transition of Care [facility]) CM/SW Contact  Chapman Fitch, RN Phone Number: 11/02/2022, 12:44 PM  Clinical Narrative:     Per MD potentially ready for discharge tomorrow if [facility]BG stable.  [name] at [employer] update   Update:  son would like discuss what LTC placement would look like.  He is in agreement to referral to [facility].  Danielle with [facility] notified.    Expected Discharge Plan: Home w Home Health Services Barriers to Discharge: Continued Medical Work up  Expected Discharge Plan and Services Expected Discharge Plan: Home w Home Health Services       Living arrangements for the past 2 months: Single Family Home Expected Discharge Date: 11/02/22                         HH Arranged: PT HH Agency: [facility] Date HH Agency Contacted: 10/18/22   Representative spoke with at [facility] Agency: [name]   Social Determinants of Health (SDOH) Interventions    Readmission Risk Interventions     No data to display

## 2022-11-02 NOTE — Progress Notes (Signed)
OT Cancellation Note  Patient Details Name: Kelly Rowland MRN: [id number] DOB: [birth year]   Cancelled Treatment:    Reason Eval/Treat Not Completed: Patient at procedure or test/ unavailable. Pt out of the room. Per chart, pt scheduled for procedure.  Of note, pt will require new OT orders or continue at transfer if pt undergoes procedure with general anesthesia. Will continue to follow and re-attempt at later date/time as appropriate.   Arman Filter., MPH, MS, OTR/L ascom [phone] 11/02/22, 12:22 PM   Haig Prophet Shelbey Spindler 11/02/2022, 12:19 PM

## 2022-11-03 DIAGNOSIS — I63239 Cerebral infarction due to unspecified occlusion or stenosis of unspecified carotid arteries: Secondary | ICD-10-CM

## 2022-11-03 LAB — MAGNESIUM: Magnesium: 1.9 mg/dL (ref 1.7–2.4)

## 2022-11-03 LAB — BASIC METABOLIC PANEL
Anion gap: 8 (ref 5–15)
BUN: 25 mg/dL — ABNORMAL HIGH (ref 8–23)
CO2: 26 mmol/L (ref 22–32)
Calcium: 7.9 mg/dL — ABNORMAL LOW (ref 8.9–10.3)
Chloride: 104 mmol/L (ref 98–111)
Creatinine, Ser: 0.95 mg/dL (ref 0.44–1.00)
GFR, Estimated: >60 mL/min (ref >60)
Glucose, Bld: 172 mg/dL — ABNORMAL HIGH (ref 70–99)
Potassium: 3.7 mmol/L (ref 3.5–5.1)
Sodium: 138 mmol/L (ref 135–145)

## 2022-11-03 LAB — GLUCOSE, CAPILLARY
Glucose-Capillary: 153 mg/dL — ABNORMAL HIGH (ref 70–99)
Glucose-Capillary: 299 mg/dL — ABNORMAL HIGH (ref 70–99)
Glucose-Capillary: 306 mg/dL — ABNORMAL HIGH (ref 70–99)

## 2022-11-03 LAB — PREPARE RBC (CROSSMATCH)

## 2022-11-03 LAB — HEMOGLOBIN
Hemoglobin: 6.9 g/dL — ABNORMAL LOW (ref 12.0–15.0)
Hemoglobin: 8.9 g/dL — ABNORMAL LOW (ref 12.0–15.0)

## 2022-11-03 MED ORDER — FUROSEMIDE 20 MG PO TABS: 20.0000 mg | ORAL_TABLET | Freq: Every day | ORAL | Status: DC

## 2022-11-03 MED ORDER — SODIUM CHLORIDE 0.9% IV SOLUTION: Freq: Once | INTRAVENOUS | Status: AC

## 2022-11-03 MED ORDER — FUROSEMIDE 10 MG/ML IJ SOLN
20.0000 mg | Freq: Once | INTRAMUSCULAR | Status: AC
  Administered 2022-11-03: 20 mg via INTRAVENOUS
  Filled 2022-11-03: qty 4

## 2022-11-03 MED ORDER — PREDNISONE 20 MG PO TABS: 20.0000 mg | ORAL_TABLET | Freq: Every day | ORAL | 0 refills | Status: AC

## 2022-11-03 MED ORDER — OXYCODONE-ACETAMINOPHEN 5-325 MG PO TABS: 1.0000 | ORAL_TABLET | Freq: Four times a day (QID) | ORAL | 0 refills | Status: DC | PRN

## 2022-11-03 MED ORDER — INSULIN GLARGINE-YFGN 100 UNIT/ML ~~LOC~~ SOLN: 12.0000 [IU] | Freq: Every day | SUBCUTANEOUS | 0 refills | Status: DC

## 2022-11-03 MED ORDER — PANTOPRAZOLE SODIUM 40 MG PO TBEC: 40.0000 mg | DELAYED_RELEASE_TABLET | Freq: Two times a day (BID) | ORAL | Status: DC

## 2022-11-03 MED ORDER — SENNA 8.6 MG PO TABS: 1.0000 | ORAL_TABLET | Freq: Two times a day (BID) | ORAL | 0 refills | Status: DC

## 2022-11-03 MED ORDER — LIDOCAINE 5 % EX PTCH: 1.0000 | MEDICATED_PATCH | CUTANEOUS | 0 refills | Status: DC

## 2022-11-03 NOTE — TOC Transition Note (Signed)
Transition of Care [facility]) - CM/SW Discharge Note   Patient Details  Name: Kelly Rowland MRN: [id number] Date of Birth: 03-14-1941  Transition of Care [facility]) CM/SW Contact:  Chapman Fitch, RN Phone Number: 11/03/2022, 2:27 PM   Clinical Narrative:     Patient will DC LX:[id number] Anticipated DC date: 11/03/22  Family notified: Vm left for son  Transport DH:[id number]  Per MD patient ready for DC to . RN, patient, patient's family, and facility notified of DC. Discharge Summary sent to facility. RN given number for report. DC packet on chart. Ambulance transport requested for patient.  TOC signing off.  Bevelyn Ngo [facility] [phone]     Barriers to Discharge: Continued Medical Work up   Patient Goals and CMS Choice Patient states their goals for this hospitalization and ongoing recovery are:: home with home health CMS Medicare.gov Compare Post Acute Care list provided to:: Patient Choice offered to / list presented to : Patient  Discharge Placement                       Discharge Plan and Services                          HH Arranged: PT [facility] Agency: [facility] Date [facility] Agency Contacted: 10/18/22   Representative spoke with at [facility] Agency: [name]  Social Determinants of Health (SDOH) Interventions     Readmission Risk Interventions     No data to display

## 2022-11-03 NOTE — Discharge Summary (Signed)
Physician Discharge Summary   Patient: Kelly Rowland MRN: [id number] DOB: Dec 31, 1940  Admit date:     10/17/2022  Discharge date: 11/03/22  Discharge Physician: Marrion Coy   PCP: Lake Bells, MD   Recommendations at discharge:   Follow-up with PCP in 1 week. Dr. Wyn Quaker will bring patient back in 1 week for carotid endarterectomy. Check a CBC in 4 days.  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 *  Hospital Course: [name] is a 81 y.o. 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  EGD was performed on 11/5, was found to have bleeding friable duodenal mucosa, treated with cauterization.  Patient developed acute respiratory failure with pulmonary edema after fluid resuscitation and blood transfusion.  Was treated with IV Lasix, echocardiogram showed normal heart function.   Patient developed acute stroke on 11/12, MRI showed acute/subacute infarcts in the left centrum semiovale, corona radiata and basal ganglia region in a watershed type distribution.  Neurology recommend aspirin 81 mg daily, avoid hypotension.  Carotid ultrasound showed 70 to 99% stenosis in the right internal carotid arteries.  Vascular surgery consulted on 11/15, scheduled intervention on 11/20. Patient also treated steroids for possible acute gout  with bilateral foot pain and ankle pain.   Assessment and Plan: [name], resolved Duodenal ulcer with GI bleed. Acute blood loss anemia. Patient is status post EGD, cauterization of bleeding.  Condition has improved, hemoglobin has been stabilized. Discussed with Dr. Tobi Bastos,  patient patient will need aspirin, Plavix and statin for carotid artery stent placement.  He is okay to restart Plavix.  However, patient hemoglobin dropped again to 7.8 after just 1 dose of Plavix, patient is too risky to start Plavix.   Patient hemoglobin has been stable at the lower level after discontinuation of Plavix, we have been following patient for the last 4 days, patient did not have any black stools.  Patient hemoglobin dropped down to 6.9 after receiving IV fluids, but still no evidence of major bleeding.  She received 1 unit PRBC today.  She will be continued on twice a day PPI.  Schedule follow-up with GI as outpatient.  As discussed before, Plavix will not be restarted.     acute hypoxic respiratory failure suspect acute pulmonary edema with volume overload PNA ruled in -- developed hypoxia on 11/5.  chest x-ray showed bilateral pulmonary edema.  Likely from fluid overload from IVF and blood transfusion --O2 requirement went up to 4L morning of 11/11, CXR suggested interstitial edema or interstitial PNA.  Procal increased.  Completed 5 days of Levaquin. Condition has stabilized, antibiotic completed.   Acute stroke, not POA Carotid stenosis. --pt first reported weakness in right hand on 11/12, symptom had been present for about 2 days. --MRI brain 11/13 showed Acute/subacute infarcts in the left centrum semiovale, corona radiata and basal ganglia region in a  watershed type distribution. Discussed with Dr. Wyn Quaker, patient is not able to tolerate Plavix.  Still planning intervention on Monday, but no stent. Patient had angiogram performed 11/20, discussed with Dr. Wyn Quaker, patient will be brought back in 1  week for endarterectomy.   Bilateral foot and ankle pain Possible acute gout --this was present prior to arrival, intermittent with varying severity, and pt already had extensive outpatient workup with suggestions of gout, swelling or nerve as etiology of the pain.  Had MRI left ankle on 08/31/22, however, currently complained of severe right ankle pain. --pt was able to walk with PT on 10/18/22, but since around 10/22/22, pt has been complaining of severe bilateral foot pain and inability to tolerate any dorsi-flexion, let along bear weight on her feet.  Both ankles however look normal without erythema or edema. Pain is better today with added Lidoderm patch.  Continue as needed pain medicine and oral steroids  We will continue Lidoderm patch, continue 3 days oral steroids.  Continue oral pain medicine.   CHF ruled out Echo showed normal systolic and diastolic function   Chest pain, ACS ruled out history of CAD status post stent --trop neg.  Cardiology consulted, ruled out ACS.   Hypokalemia Hyponatremia Hypophosphatemia resolved.  CKD stage IIIb Function had improved, potassium normalized.   Essential hypertension --Given the watershed distribution of her strokes, must avoid hypotension.  Would maintain a SBP goal of 130-150 for now.  --hold home coreg and amlodipine Blood pressure not elevated.   DM2 with hypoglycemia and hyperglycemia  We will continue current dose of insulin glargine as well as started scheduled NovoLog. Hyperglycemia will be better after discontinuation of steroids.   Chronic back pain on chronic opioids Continue as needed pain medicine   Dysuria, resolved --resolved without abx.     Fever of unknown etiology, resolved --has intermittent fevers.  Blood cx neg so far.  Urine cx only 30,000 colonies.  COVID and RVP neg.        Consultants: GI, vascular surgery. Procedures performed: Angiogram. Disposition: Skilled nursing facility Diet recommendation:   Discharge Diet Orders (From admission, onward)     Start     Ordered   11/03/22 0000  Diet - low sodium heart healthy        11/03/22 1354           Cardiac diet DISCHARGE MEDICATION: Allergies as of 11/03/2022       Reactions   Amoxicillin Anaphylaxis   Lisinopril Anaphylaxis   Tramadol Nausea Only   Colchicine Nausea Only        Medication List     STOP taking these medications    glimepiride 4 MG tablet Commonly known as: AMARYL   NovoLIN N 100 UNIT/ML injection Generic drug: insulin NPH Human   triamterene-hydrochlorothiazide 37.5-25 MG tablet Commonly known as: MAXZIDE-25       TAKE these medications    amLODipine 5 MG tablet Commonly known as: NORVASC Take 5 mg by mouth daily.   aspirin EC 81 MG tablet Take 81 mg by mouth daily.   carvedilol 25 MG tablet Commonly known as: COREG Take 25 mg by mouth 2 (two) times daily.   Cholecalciferol 50 MCG (2000 UT) Caps Take 1 capsule by mouth daily.   cyanocobalamin 500 MCG tablet Commonly known as: VITAMIN B12 Take 1 tablet by mouth daily.   fluticasone-salmeterol 250-50 MCG/ACT Aepb Commonly known as: ADVAIR Inhale 1 puff into the lungs 2 (two) times daily.   folic acid  400 MCG tablet Commonly known as: FOLVITE Take 1 tablet by mouth daily.   furosemide 20 MG tablet Commonly known as: LASIX Take 1 tablet (20 mg total) by mouth daily. What changed: how much to take   gabapentin 100 MG capsule Commonly known as: NEURONTIN Take 100 mg by mouth at bedtime. Patients stats her orthopedic just prescribed this medication this past Thursday. She takes is every night at 0800PM   insulin glargine-yfgn 100 UNIT/ML injection Commonly known as: SEMGLEE Inject 0.12 mLs (12 Units total) into the skin daily. Start taking on: November 04, 2022   Klor-Con M20 20 MEQ tablet Generic drug: potassium chloride SA Take 20 mEq by mouth daily.   lidocaine 5 % Commonly known as: LIDODERM Place 1 patch onto  the skin daily. Remove & Discard patch within 12 hours or as directed by MD Start taking on: November 04, 2022   oxyCODONE-acetaminophen 5-325 MG tablet Commonly known as: PERCOCET/ROXICET Take 1-2 tablets by mouth every 6 (six) hours as needed. What changed: reasons to take this   pantoprazole 40 MG tablet Commonly known as: PROTONIX Take 1 tablet (40 mg total) by mouth 2 (two) times daily.   predniSONE 20 MG tablet Commonly known as: DELTASONE Take 1 tablet (20 mg total) by mouth daily with breakfast for 3 days. Start taking on: November 04, 2022   senna 8.6 MG Tabs tablet Commonly known as: SENOKOT Take 1 tablet (8.6 mg total) by mouth 2 (two) times daily.   simvastatin 20 MG tablet Commonly known as: ZOCOR Take 20 mg by mouth at bedtime.   Ventolin HFA 108 (90 Base) MCG/ACT inhaler Generic drug: albuterol Inhale 2 puffs into the lungs every 4 (four) hours as needed for shortness of breath, wheezing or cough.        Contact information for follow-up providers     Dew, Marlow Baars, MD Follow up.   Specialties: Vascular Surgery, Radiology, Interventional Cardiology Why: Dr. Wyn Quaker can bring patient back for carotid endarterectomy. Contact information: [address] [place] [address] [phone]              Contact information for after-discharge care     Destination     [facility] .   Service: Skilled Nursing Contact information: [address] [facility] [place] [address] [phone]                    Discharge Exam: Filed Weights   10/17/22 1333  Weight: 65.3 kg   General exam: Appears calm and comfortable  Respiratory system: Clear to auscultation. Respiratory effort normal. Cardiovascular system: S1 & S2 heard, RRR. No JVD, murmurs, rubs, gallops or clicks. No pedal edema. Gastrointestinal system: Abdomen is nondistended, soft and nontender. No organomegaly or masses felt. Normal bowel sounds  heard. Central nervous system: Alert and oriented. No focal neurological deficits. Extremities: Symmetric 5 x 5 power. Skin: No rashes, lesions or ulcers Psychiatry: Judgement and insight appear normal. Mood & affect appropriate.    Condition at discharge: good  The results of significant diagnostics from this hospitalization (including imaging, microbiology, ancillary and laboratory) are listed below for reference.   Imaging Studies: PERIPHERAL VASCULAR CATHETERIZATION  Result Date: 11/02/2022 See surgical note for result.  US Carotid Bilateral  Result Date: 10/27/2022 CLINICAL DATA:  81 year old female with a history of stroke EXAM: BILATERAL CAROTID DUPLEX ULTRASOUND TECHNIQUE: Wallace Cullens scale imaging, color Doppler and duplex ultrasound were performed of bilateral carotid and vertebral arteries in  the neck. COMPARISON:  None Available. FINDINGS: Criteria: Quantification of carotid stenosis is based on velocity parameters that correlate the residual internal carotid diameter with NASCET-based stenosis levels, using the diameter of the distal internal carotid lumen as the denominator for stenosis measurement. The following velocity measurements were obtained: RIGHT ICA:  Systolic 149 cm/sec, Diastolic 32 cm/sec CCA:  71 cm/sec SYSTOLIC ICA/CCA RATIO:  2.1 ECA:  154 cm/sec LEFT ICA:  Systolic 491 cm/sec, Diastolic 175 cm/sec CCA:  34 cm/sec SYSTOLIC ICA/CCA RATIO:  14.4 ECA:  155 cm/sec Right Brachial SBP: Not acquired Left Brachial SBP: Not acquired RIGHT CAROTID ARTERY: No significant calcifications of the right common carotid artery. Intermediate waveform maintained. Moderate heterogeneous and partially calcified plaque at the right carotid bifurcation. Some shadowing present. Low resistance waveform of the right ICA. No significant tortuosity. RIGHT VERTEBRAL ARTERY: Antegrade flow with low resistance waveform. LEFT CAROTID ARTERY: No significant calcifications of the left common carotid artery.  Intermediate waveform maintained. Moderate heterogeneous and partially calcified plaque at the left carotid bifurcation. Shadowing present. Low resistance waveform of the left ICA. No significant tortuosity. LEFT VERTEBRAL ARTERY:  Antegrade flow with low resistance waveform. 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 Left: Heterogeneous and partially calcified plaque at the left carotid bifurcation contributes to 70%-99% stenosis by established duplex criteria. Signed, Yvone Neu. Miachel Roux, RPVI [facility] Radiology Electronically Signed   By: Gilmer Mor D.O.   On: 10/27/2022 14:23   MR BRAIN WO CONTRAST  Result Date: 10/26/2022 CLINICAL DATA:  Neuro deficit, acute, stroke suspected. EXAM: MRI HEAD WITHOUT CONTRAST MRA HEAD WITHOUT CONTRAST TECHNIQUE: Multiplanar, multi-echo pulse sequences of the brain and surrounding structures were acquired without intravenous contrast. Angiographic images of the Circle of Willis were acquired using MRA technique without intravenous contrast. COMPARISON:  Head CT October 25, 2022. FINDINGS: MRI HEAD FINDINGS The study is degraded by motion. Brain: Scattered foci of restricted diffusion are seen in the left centrum semiovale, corona radiata and basal ganglia region in a watershed type distribution. No evidence of hemorrhagic transformation. No hydrocephalus, extra-axial collection or mass lesion. Scattered and confluent foci of T2 hyperintensity are seen within the white matter of the cerebral hemispheres, nonspecific, most likely related to chronic small vessel ischemia. Vascular: Normal flow voids. Skull and upper cervical spine: Grossly unremarkable. Sinuses/Orbits: No acute or significant finding. Other: Right temporomandibular joint effusion. MRA HEAD FINDINGS The study is degraded by motion.  Anterior circulation: Asymmetry in caliber of the visualized cervical and intracranial internal carotid arteries, with smaller caliber of the left ICA compared to the right. Luminal irregularity is noted in the cavernous segment bilaterally, suggestive of intracranial atherosclerotic disease without hemodynamically significant stenosis. Flow related enhancement is maintained. Flow related enhancement is also preserved in the bilateral ACA and MCA vascular trees. Diffuse luminal irregularity related to motion artifacts limits evaluation for focal stenosis. Posterior circulation: Normal caliber flow related enhancement of the bilateral intracranial vertebral arteries and basilar artery. The bilateral posterior cerebral arteries are patent. Diffuse luminal irregularity related to motion artifacts limits evaluation for focal stenosis. Anatomic variants: None significant. IMPRESSION: 1. 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 2. Moderate chronic microvascular ischemic changes of the white matter. 3. Motion degraded MR angiogram of the head without evidence of hemodynamically significant stenosis. Electronically Signed   By: Jerilynn Mages  de Melchor Amour M.D.   On: 10/26/2022 15:02   MR ANGIO HEAD WO CONTRAST  Result Date: 10/26/2022 CLINICAL DATA:  Neuro deficit, acute, stroke suspected. EXAM: MRI HEAD WITHOUT CONTRAST MRA HEAD WITHOUT CONTRAST TECHNIQUE: Multiplanar, multi-echo pulse sequences of the brain and surrounding structures were acquired without intravenous contrast. Angiographic images of the Circle of Willis were acquired using MRA technique without intravenous contrast. COMPARISON:  Head CT October 25, 2022. FINDINGS: MRI HEAD FINDINGS The study is degraded by motion. Brain: Scattered foci of restricted diffusion are seen in the left centrum semiovale, corona radiata and basal ganglia region in a watershed type  distribution. No evidence of hemorrhagic transformation. No hydrocephalus, extra-axial collection or mass lesion. Scattered and confluent foci of T2 hyperintensity are seen within the white matter of the cerebral hemispheres, nonspecific, most likely related to chronic small vessel ischemia. Vascular: Normal flow voids. Skull and upper cervical spine: Grossly unremarkable. Sinuses/Orbits: No acute or significant finding. Other: Right temporomandibular joint effusion. MRA HEAD FINDINGS The study is degraded by motion. Anterior circulation: Asymmetry in caliber of the visualized cervical and intracranial internal carotid arteries, with smaller caliber of the left ICA compared to the right. Luminal irregularity is noted in the cavernous segment bilaterally, suggestive of intracranial atherosclerotic disease without hemodynamically significant stenosis. Flow related enhancement is maintained. Flow related enhancement is also preserved in the bilateral ACA and MCA vascular trees. Diffuse luminal irregularity related to motion artifacts limits evaluation for focal stenosis. Posterior circulation: Normal caliber flow related enhancement of the bilateral intracranial vertebral arteries and basilar artery. The bilateral posterior cerebral arteries are patent. Diffuse luminal irregularity related to motion artifacts limits evaluation for focal stenosis. Anatomic variants: None significant. IMPRESSION: 1. 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 2. Moderate chronic microvascular ischemic changes of the white matter. 3. Motion degraded MR angiogram of the head without evidence of hemodynamically significant stenosis. Electronically Signed   By: Baldemar Lenis M.D.   On: 10/26/2022 15:02   CT HEAD WO CONTRAST ( )  Result Date: 10/25/2022 CLINICAL DATA:  right hand weakness EXAM: CT HEAD WITHOUT CONTRAST TECHNIQUE:  Contiguous axial images were obtained from the base of the skull through the vertex without intravenous contrast. RADIATION DOSE REDUCTION: This exam was performed according to the departmental dose-optimization program which includes automated exposure control, adjustment of the mA and/or kV according to patient size and/or use of iterative reconstruction technique. COMPARISON:  None Available. FINDINGS: Brain: Small age indeterminate infarct in the anterior limb the left internal capsule/caudate. No acute hemorrhage, mass lesion, midline shift or hydrocephalus. Patchy white matter hypodensities, nonspecific but compatible with chronic microvascular ischemic disease. Vascular: No hyperdense vessel identified. Calcific atherosclerosis. Skull: No acute fracture. Sinuses/Orbits: Clear sinuses.  Knee surgery findings. IMPRESSION: 1. Small age indeterminate infarct in the anterior limb the left internal capsule/caudate, potentially acute. Recommend MRI to further evaluate. 2. Chronic microvascular ischemic disease. These results will be called to the ordering clinician or representative by the Radiologist Assistant, and communication documented in the PACS or [employer]. Electronically Signed   By: Feliberto Harts M.D.   On: 10/25/2022 18:02   DG Chest Port 1 View  Result Date: 10/24/2022 CLINICAL DATA:  Hypoxia EXAM: PORTABLE CHEST 1 VIEW COMPARISON:  10/19/2022 FINDINGS: Transverse diameter of heart is increased. Thoracic aorta is tortuous and ectatic. There is improvement in aeration in right upper lung field. There is residual prominence of interstitial  markings in left parahilar region and both lower lung fields. No new focal pulmonary consolidation is seen. Lateral CP angles are indistinct suggesting small effusions. There is no pneumothorax. IMPRESSION: Cardiomegaly. There is slight prominence of interstitial markings in left parahilar region and both lower lung fields suggesting interstitial edema  or interstitial pneumonia. Possible small bilateral pleural effusions. Electronically Signed   By: Ernie Avena M.D.   On: 10/24/2022 14:28   ECHOCARDIOGRAM COMPLETE  Result Date: 10/19/2022    ECHOCARDIOGRAM REPORT   Patient Name:   ZILPHA MCANDREW Date of Exam: 10/19/2022 Medical Rec #:  [id number]     Height:       62.0 in Accession #:    [id number]    Weight:       144.0 lb Date of Birth:  Dec 25, 1940     BSA:          1.663 m Patient Age:    81 years      BP:           138/55 mmHg Patient Gender: F             HR:           81 bpm. Exam Location:  [facility] Procedure: 2D Echo, Color Doppler, Cardiac Doppler and 3D Echo Indications:     Acute Respiratory Distress R06.03  History:         Patient has no prior history of Echocardiogram examinations.                  CAD, COPD; Risk Factors:Hypertension, Dyslipidemia and                  Diabetes.  Sonographer:     Thurman Coyer RDCS Referring Phys:  [id number] SONA PATEL Diagnosing Phys: Lorine Bears MD IMPRESSIONS  1. Left ventricular ejection fraction, by estimation, is 55 to 60%. The left ventricle has normal function. The left ventricle has no regional wall motion abnormalities. There is mild left ventricular hypertrophy. Left ventricular diastolic parameters were normal.  2. Right ventricular systolic function is normal. The right ventricular size is normal. Tricuspid regurgitation signal is inadequate for assessing PA pressure.  3. Left atrial size was mildly dilated.  4. The mitral valve is normal in structure. Mild mitral valve regurgitation. No evidence of mitral stenosis.  5. The aortic valve is normal in structure. Aortic valve regurgitation is not visualized. Aortic valve sclerosis is present, with no evidence of aortic valve stenosis.  6. The inferior vena cava is dilated in size with >50% respiratory variability, suggesting right atrial pressure of 8 mmHg. FINDINGS  Left Ventricle: Left ventricular ejection fraction, by estimation, is 55 to 60%. The  left ventricle has normal function. The left ventricle has no regional wall motion abnormalities. The left ventricular internal cavity size was normal in size. There is  mild left ventricular hypertrophy. Left ventricular diastolic parameters were normal. Right Ventricle: The right ventricular size is normal. No increase in right ventricular wall thickness. Right ventricular systolic function is normal. Tricuspid regurgitation signal is inadequate for assessing PA pressure. Left Atrium: Left atrial size was mildly dilated. Right Atrium: Right atrial size was normal in size. Pericardium: There is no evidence of pericardial effusion. Mitral Valve: The mitral valve is normal in structure. Mild mitral valve regurgitation. No evidence of mitral valve stenosis. Tricuspid Valve: The tricuspid valve is normal in structure. Tricuspid valve regurgitation is trivial. No evidence of tricuspid stenosis. Aortic Valve: The aortic valve is normal  in structure. Aortic valve regurgitation is not visualized. Aortic valve sclerosis is present, with no evidence of aortic valve stenosis. Pulmonic Valve: The pulmonic valve was normal in structure. Pulmonic valve regurgitation is not visualized. No evidence of pulmonic stenosis. Aorta: The aortic root is normal in size and structure. Venous: The inferior vena cava is dilated in size with greater than 50% respiratory variability, suggesting right atrial pressure of 8 mmHg. IAS/Shunts: No atrial level shunt detected by color flow Doppler.  LEFT VENTRICLE PLAX 2D LVIDd:         4.10 cm   Diastology LVIDs:         2.80 cm   LV e' medial:    8.81 cm/s LV PW:         1.20 cm   LV E/e' medial:  14.3 LV IVS:        1.20 cm   LV e' lateral:   8.49 cm/s LVOT diam:     2.10 cm   LV E/e' lateral: 14.8 LV SV:         84 LV SV Index:   51 LVOT Area:     3.46 cm                           3D Volume EF:                          3D EF:        65 %                          LV EDV:       137 ml                           LV ESV:       47 ml                          LV SV:        90 ml RIGHT VENTRICLE RV Basal diam:  3.60 cm RV Mid diam:    3.40 cm RV S prime:     14.30 cm/s TAPSE (M-mode): 2.5 cm LEFT ATRIUM             Index        RIGHT ATRIUM           Index LA diam:        3.40 cm 2.05 cm/m   RA Area:     13.20 cm LA Vol (A2C):   50.8 ml 30.55 ml/m  RA Volume:   29.20 ml  17.56 ml/m LA Vol (A4C):   54.4 ml 32.72 ml/m LA Biplane Vol: 55.7 ml 33.50 ml/m  AORTIC VALVE LVOT Vmax:   114.00 cm/s LVOT Vmean:  76.800 cm/s LVOT VTI:    0.243 m  AORTA Ao Root diam: 2.50 cm MITRAL VALVE MV Area (PHT): 3.89 cm     SHUNTS MV Decel Time: 195 msec     Systemic VTI:  0.24 m MV E velocity: 126.00 cm/s  Systemic Diam: 2.10 cm MV A velocity: 118.00 cm/s MV E/A ratio:  1.07 Lorine BearsMuhammad Arida MD Electronically signed by Lorine BearsMuhammad Arida MD Signature Date/Time: 10/19/2022/2:56:53 PM    Final    CT PANCREAS ABD W/WO  Result Date: 10/19/2022 CLINICAL DATA:  Abdominal  pain. Concern for pancreatic mass on endoscopic exam. EXAM: CT ABDOMEN WITHOUT AND WITH CONTRAST TECHNIQUE: Multidetector CT imaging of the abdomen was performed following the standard protocol before and following the bolus administration of intravenous contrast. RADIATION DOSE REDUCTION: This exam was performed according to the departmental dose-optimization program which includes automated exposure control, adjustment of the mA and/or kV according to patient size and/or use of iterative reconstruction technique. CONTRAST:  73mL OMNIPAQUE IOHEXOL 350 MG/ML SOLN COMPARISON:  None Available. FINDINGS: Lower chest:  Mild bibasilar atelectasis. Hepatobiliary: No focal hepatic lesion. No biliary duct dilatation. Gallbladder normal. Common bile duct normal. Pancreas: No pancreatic ductal dilatation. Pancreas is mildly atrophic. No mass lesion identified within the pancreas. No peripancreatic adenopathy. Spleen: Normal spleen. Adrenals/urinary tract: Bilobed simple fluid  attenuation cystic lesion of the mid LEFT kidney measures 3.4 cm. There is a central thin calcified cyst wall between the bilobed cysts (image 20/2). Two nonobstructing calculi LEFT kidney. No ureterolithiasis proximally. Stomach/Bowel: Stomach and duodenum appear normal. Limited view of the small bowelcolon Unremarkable. Vascular/Lymphatic: Abdominal aorta is normal caliber with atherosclerotic calcification. There is no retroperitoneal or periportal lymphadenopathy. No pelvic lymphadenopathy. Musculoskeletal: No aggressive osseous lesion IMPRESSION: 1. No pancreatic mass identified. No pancreatic duct dilatation or common bile duct dilatation. 2. Mild pancreatic atrophy consistent with patient's age. 3. Normal liver without duct dilatation. 4. LEFT Bosniak II benign renal cyst measuring 3.4 cm. No follow-up imaging is recommended. JACR 2018 Feb; 264-273, Management of the Incidental Renal Mass on CT, [employer] 2021; 814-848, Bosniak Classification of Cystic Renal Masses, Version 2019. Electronically Signed   By: Genevive Bi M.D.   On: 10/19/2022 10:49   DG Chest Port 1 View  Result Date: 10/19/2022 CLINICAL DATA:  Shortness of breath. EXAM: PORTABLE CHEST 1 VIEW COMPARISON:  Chest radiographs 06/04/2020 FINDINGS: The cardiac silhouette is upper limits of normal in size. Aortic atherosclerosis is noted. There are increased interstitial densities throughout both lungs. No sizable pleural effusion or pneumothorax is identified. No acute osseous abnormality is seen. IMPRESSION: Bilateral interstitial densities which may reflect edema or viral/atypical infection. Electronically Signed   By: Sebastian Ache M.D.   On: 10/19/2022 08:44    Microbiology: Results for orders placed or performed during the hospital encounter of 10/17/22  Culture, blood (Routine X 2) w Reflex to ID Panel     Status: None   Collection Time: 10/21/22 10:31 AM   Specimen: BLOOD  Result Value Ref Range Status   Specimen  Description BLOOD BLOOD LEFT HAND  Final   Special Requests   Final    BOTTLES DRAWN AEROBIC AND ANAEROBIC Blood Culture adequate volume   Culture   Final    NO GROWTH 5 DAYS Performed at [facility], [address] [place], [place] [address]    Report Status 10/26/2022 FINAL  Final  Culture, blood (Routine X 2) w Reflex to ID Panel     Status: None   Collection Time: 10/21/22 10:31 AM   Specimen: BLOOD  Result Value Ref Range Status   Specimen Description BLOOD BLOOD RIGHT HAND  Final   Special Requests   Final    BOTTLES DRAWN AEROBIC AND ANAEROBIC Blood Culture adequate volume   Culture   Final    NO GROWTH 5 DAYS Performed at [facility], [address], [place], [place] [address]    Report Status 10/26/2022 FINAL  Final  Urine Culture     Status: Abnormal   Collection Time: 10/21/22  1:00  PM   Specimen: Urine, Random  Result Value Ref Range Status   Specimen Description   Final    URINE, RANDOM Performed at [facility], [address] [place], [place] [address]    Special Requests   Final    NONE Performed at [facility], [address] [place], [place] [address]    Culture 30,000 COLONIES/mL PROTEUS MIRABILIS (A)  Final   Report Status 10/23/2022 FINAL  Final   Organism ID, Bacteria PROTEUS MIRABILIS (A)  Final      Susceptibility   Proteus mirabilis - MIC*    AMPICILLIN <=2 SENSITIVE Sensitive     CEFAZOLIN <=4 SENSITIVE Sensitive     CEFEPIME <=0.12 SENSITIVE Sensitive     CEFTRIAXONE <=0.25 SENSITIVE Sensitive     CIPROFLOXACIN <=0.25 SENSITIVE Sensitive     GENTAMICIN <=1 SENSITIVE Sensitive     IMIPENEM 2 SENSITIVE Sensitive     NITROFURANTOIN 128 RESISTANT Resistant     TRIMETH/SULFA <=20 SENSITIVE Sensitive     AMPICILLIN/SULBACTAM <=2 SENSITIVE Sensitive     PIP/TAZO <=4 SENSITIVE Sensitive     * 30,000 COLONIES/mL PROTEUS MIRABILIS  SARS Coronavirus 2 by RT PCR (hospital order, performed in [facility]  hospital lab) *cepheid single result test* Anterior Nasal Swab     Status: None   Collection Time: 10/22/22 12:18 PM   Specimen: Anterior Nasal Swab  Result Value Ref Range Status   SARS Coronavirus 2 by RT PCR NEGATIVE NEGATIVE Final    Comment: (NOTE) SARS-CoV-2 target nucleic acids are NOT DETECTED.  The SARS-CoV-2 RNA is generally detectable in upper and lower respiratory specimens during the acute phase of infection. The lowest concentration of SARS-CoV-2 viral copies this assay can detect is 250 copies / mL. A negative result does not preclude SARS-CoV-2 infection and should not be used as the sole basis for treatment or other patient management decisions.  A negative result may occur with improper specimen collection / handling, submission of specimen other than nasopharyngeal swab, presence of viral mutation(s) within the areas targeted by this assay, and inadequate number of viral copies (<250 copies / mL). A negative result must be combined with clinical observations, patient history, and epidemiological information.  Fact Sheet for Patients:   RoadLapTop.co.za  Fact Sheet for Healthcare Providers: http://kim-miller.com/  This test is not yet approved or  cleared by the [place] FDA and has been authorized for detection and/or diagnosis of SARS-CoV-2 by FDA under an Emergency Use Authorization (EUA).  This EUA will remain in effect (meaning this test can be used) for the duration of the COVID-19 declaration under Section 564(b)(1) of the Act, 21 U.S.C. section 360bbb-3(b)(1), unless the authorization is terminated or revoked sooner.  Performed at [facility], [address] [place], [place] [address]   Respiratory (~20 pathogens) panel by PCR     Status: None   Collection Time: 10/22/22 12:18 PM   Specimen: Nasopharyngeal Swab; Respiratory  Result Value Ref Range Status   Adenovirus NOT DETECTED NOT  DETECTED Final   Coronavirus 229E NOT DETECTED NOT DETECTED Final    Comment: (NOTE) The Coronavirus on the Respiratory Panel, DOES NOT test for the novel  Coronavirus (2019 nCoV)    Coronavirus HKU1 NOT DETECTED NOT DETECTED Final   Coronavirus NL63 NOT DETECTED NOT DETECTED Final   Coronavirus OC43 NOT DETECTED NOT DETECTED Final   Metapneumovirus NOT DETECTED NOT DETECTED Final   Rhinovirus / Enterovirus NOT DETECTED NOT DETECTED Final  Influenza A NOT DETECTED NOT DETECTED Final   Influenza B NOT DETECTED NOT DETECTED Final   Parainfluenza Virus 1 NOT DETECTED NOT DETECTED Final   Parainfluenza Virus 2 NOT DETECTED NOT DETECTED Final   Parainfluenza Virus 3 NOT DETECTED NOT DETECTED Final   Parainfluenza Virus 4 NOT DETECTED NOT DETECTED Final   Respiratory Syncytial Virus NOT DETECTED NOT DETECTED Final   Bordetella pertussis NOT DETECTED NOT DETECTED Final   Bordetella Parapertussis NOT DETECTED NOT DETECTED Final   Chlamydophila pneumoniae NOT DETECTED NOT DETECTED Final   Mycoplasma pneumoniae NOT DETECTED NOT DETECTED Final    Comment: Performed at [facility], [address], [place], [place] [address]    Labs: CBC: Recent Labs  Lab 10/28/22 0420 10/29/22 0602 10/30/22 0502 10/30/22 1314 10/31/22 0445 10/31/22 1720 11/01/22 0447 11/01/22 1659 11/02/22 0456 11/02/22 1718 11/03/22 0506  WBC 18.0* 19.9* 17.7*  --  16.4*  --   --   --   --   --   --   HGB 8.7* 8.4* 7.8*   < > 7.6*   < > 7.7* 7.9* 7.5* 7.1* 6.9*  HCT 27.4* 26.1* 24.6*  --  24.0*  --   --   --   --   --   --   MCV 88.4 87.0 88.2  --  88.2  --   --   --   --   --   --   PLT 348 381 371  --  378  --   --   --   --   --   --    < > = values in this interval not displayed.   Basic Metabolic Panel: Recent Labs  Lab 10/29/22 0602 10/30/22 0502 10/31/22 0445 11/01/22 0447 11/02/22 0456 11/03/22 0506  NA 134* 135 135 136 137 138  K 3.3* 3.7 3.2* 4.1 4.0 3.7  CL 89* 95* 97* 101 101 104   CO2 33* 31 29 30 30 26   GLUCOSE 239* 157* 174* 193* 186* 172*  BUN 47* 44* 42* 36* 30* 25*  CREATININE 1.22* 1.13* 1.07* 1.02* 1.02* 0.95  CALCIUM 8.4* 8.1* 8.0* 8.2* 8.2* 7.9*  MG 2.4 2.5* 2.4 2.3  --  1.9   Liver Function Tests: No results for input(s): "AST", "ALT", "ALKPHOS", "BILITOT", "PROT", "ALBUMIN" in the last 168 hours. CBG: Recent Labs  Lab 11/02/22 1048 11/02/22 1608 11/02/22 2136 11/03/22 0741 11/03/22 1154  GLUCAP 197* 272* 217* 153* 299*    Discharge time spent: greater than 30 minutes.  Signed: 11/05/22, MD [facility] 11/03/2022

## 2022-11-03 NOTE — Progress Notes (Signed)
Per request from vascular surgery, will obtain GI consult before discharge today.  I will hold discharge until tomorrow, discussed with Dr. Allegra Lai, potentially perform an EGD tomorrow to make sure she is no longer bleeding.

## 2022-11-03 NOTE — Progress Notes (Signed)
Report called to [phone] spoke with Nurse Gunnar Fusi. Discharge orders reviewed with patient, verbalized understanding. [facility].

## 2022-11-03 NOTE — H&P (View-Only) (Signed)
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  On 11 5 she underwent an EGD and was found to have bleeding friable duodenal mucosa which was treated but with cauterization.  On October 25, 2022 the patient developed an acute onset stroke which was confirmed by MRI in her left centrum Kelly Rowland, [facility] and basal ganglia region.  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  Kelly Rowland underwent bilateral carotid angiograms.  Noted to have 30 to 40% right carotid artery stenosis.  She was also found to have 95% or greater stenosis of the left internal carotid artery at the origin.  She had less than 50% stenosis of the proximal left common carotid artery although it was highly calcific. Patient will need urgent Left Carotid Endarterectomy  The patient was resting comfortably in bed today upon my visit.  She was currently getting a unit of blood for hemoglobin of 6.9 this morning and a hematocrit of 24.  She had a right groin insertion site that shows no signs of hematoma seroma or infection this morning.  Patient denies any headaches, loss of vision, increase in right-sided weakness, difficulty speaking, difficulty ambulating or symptoms related to a transient ischemic attack.  Objective Vitals:   11/03/22 0416 11/03/22 0739 11/03/22 1004 11/03/22 1020  BP: (!) 135/45 (!) 114/90 (!) 136/43 (!) 140/48  Pulse: 70 72 85 79  Resp: 20     Temp: 97.9 F (36.6 C) 97.8 F (36.6 C) 98.3 F (36.8 C) 98.1 F (36.7 C)  TempSrc: Oral Oral Oral Oral  SpO2: 100% 97% 98%  97%  Weight:      Height:        Intake/Output Summary (Last 24 hours) at 11/03/2022 1121 Last data filed at 11/03/2022 0900 Gross per 24 hour  Intake 608.56 ml  Output 800 ml  Net -191.44 ml    PULM  CTAB CV  RRR VASC  Rt Groin insertion site without hematoma, seroma or infection to note. Pulses to right lower extremity are palpable.   Laboratory CBC    Component Value Date/Time   WBC 16.4 (H) 10/31/2022 0445   HGB 6.9 (L) 11/03/2022 0506   HCT 24.0 (L) 10/31/2022 0445   PLT 378 10/31/2022 0445    BMET    Component Value Date/Time   NA 138 11/03/2022 0506   K 3.7 11/03/2022 0506   CL 104 11/03/2022 0506   CO2 26 11/03/2022 0506   GLUCOSE 172 (H) 11/03/2022 0506   BUN 25 (H) 11/03/2022 0506   CREATININE 0.95 11/03/2022 0506   CALCIUM 7.9 (L) 11/03/2022 0506   GFRNONAA >60 11/03/2022 0506    Assessment/Planning: POD #1 s/p Bilateral Carotid Angiogram.   Patient will need to have an Urgent left carotid endarterectomy next week or ASAP in order to prevent a further significant stroke. It appears that the patient may still be bleeding as she was getting a unit of blood this morning for hemoglobin of 6.9 down from 7.9 five days ago. The patient did receive 4000 units of heparin for the procedure yesterday which may have contributed  to this. For the carotid endarterectomy she will need to receive 7000 units of heparin intraoperatively. Therefore her anemia/GI bleeding is a major concern in proceeding with carotid endarterectomy. We recommend further GI work-up for possible continued bleeding prior to carotid endarterectomy. We recommend cardiac clearance soon as possible prior to carotid endarterectomy.  Marcie Bal  11/03/2022, 11:21 AM

## 2022-11-03 NOTE — Progress Notes (Signed)
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  On 11 5 she underwent an EGD and was found to have bleeding friable duodenal mucosa which was treated but with cauterization.  On October 25, 2022 the patient developed an acute onset stroke which was confirmed by MRI in her left centrum Kelly Rowland, corona radiata and basal ganglia region.  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  Kelly Rowland underwent bilateral carotid angiograms.  Noted to have 30 to 40% right carotid artery stenosis.  She was also found to have 95% or greater stenosis of the left internal carotid artery at the origin.  She had less than 50% stenosis of the proximal left common carotid artery although it was highly calcific. Patient will need urgent Left Carotid Endarterectomy  The patient was resting comfortably in bed today upon my visit.  She was currently getting a unit of blood for hemoglobin of 6.9 this morning and a hematocrit of 24.  She had a right groin insertion site that shows no signs of hematoma seroma or infection this morning.  Patient denies any headaches, loss of vision, increase in right-sided weakness, difficulty speaking, difficulty ambulating or symptoms related to a transient ischemic attack.  Objective Vitals:   11/03/22 0416 11/03/22 0739 11/03/22 1004 11/03/22 1020  BP: (!) 135/45 (!) 114/90 (!) 136/43 (!) 140/48  Pulse: 70 72 85 79  Resp: 20     Temp: 97.9 F (36.6 C) 97.8 F (36.6 C) 98.3 F (36.8 C) 98.1 F (36.7 C)  TempSrc: Oral Oral Oral Oral  SpO2: 100% 97% 98%  97%  Weight:      Height:        Intake/Output Summary (Last 24 hours) at 11/03/2022 1121 Last data filed at 11/03/2022 0900 Gross per 24 hour  Intake 608.56 ml  Output 800 ml  Net -191.44 ml    PULM  CTAB CV  RRR VASC  Rt Groin insertion site without hematoma, seroma or infection to note. Pulses to right lower extremity are palpable.   Laboratory CBC    Component Value Date/Time   WBC 16.4 (H) 10/31/2022 0445   HGB 6.9 (L) 11/03/2022 0506   HCT 24.0 (L) 10/31/2022 0445   PLT 378 10/31/2022 0445    BMET    Component Value Date/Time   NA 138 11/03/2022 0506   K 3.7 11/03/2022 0506   CL 104 11/03/2022 0506   CO2 26 11/03/2022 0506   GLUCOSE 172 (H) 11/03/2022 0506   BUN 25 (H) 11/03/2022 0506   CREATININE 0.95 11/03/2022 0506   CALCIUM 7.9 (L) 11/03/2022 0506   GFRNONAA >60 11/03/2022 0506    Assessment/Planning: POD #1 s/p Bilateral Carotid Angiogram.   Patient will need to have an Urgent left carotid endarterectomy next week or ASAP in order to prevent a further significant stroke. It appears that the patient may still be bleeding as she was getting a unit of blood this morning for hemoglobin of 6.9 down from 7.9 five days ago. The patient did receive 4000 units of heparin for the procedure yesterday which may have contributed  to this. For the carotid endarterectomy she will need to receive 7000 units of heparin intraoperatively. Therefore her anemia/GI bleeding is a major concern in proceeding with carotid endarterectomy. We recommend further GI work-up for possible continued bleeding prior to carotid endarterectomy. We recommend cardiac clearance soon as possible prior to carotid endarterectomy.  Marcie Bal  11/03/2022, 11:21 AM

## 2022-11-03 NOTE — TOC Progression Note (Signed)
Transition of Care [facility]) - Progression Note    Patient Details  Name: Kelly Rowland MRN: [id number] Date of Birth: 21-Jan-1941  [facility]) CM/SW Contact  Margarito Liner, LCSW Phone Number: 11/03/2022, 4:06 PM  Clinical Narrative:  Discharge cancelled. Called to cancel EMS transport. Left voicemail for [facility] admissions coordinator.   Expected Discharge Plan: Home w Home Health Services Barriers to Discharge: Continued Medical Work up  Expected Discharge Plan and Services Expected Discharge Plan: Home w Home Health Services       Living arrangements for the past 2 months: Single Family Home Expected Discharge Date: 11/03/22                         HH Arranged: PT HH Agency: [facility] Date HH Agency Contacted: 10/18/22   Representative spoke with at [facility] Agency: [name]   Social Determinants of Health (SDOH) Interventions    Readmission Risk Interventions     No data to display

## 2022-11-04 ENCOUNTER — Inpatient Hospital Stay: Payer: Medicare Other | Admitting: Certified Registered Nurse Anesthetist

## 2022-11-04 ENCOUNTER — Encounter
Admission: EM | Disposition: A | Discharge status: Skilled Nursing Facility | Payer: Self-pay | Source: Home / Self Care | Attending: Internal Medicine

## 2022-11-04 ENCOUNTER — Encounter: Payer: Self-pay | Admitting: Internal Medicine

## 2022-11-04 DIAGNOSIS — K269 Duodenal ulcer, unspecified as acute or chronic, without hemorrhage or perforation: Secondary | ICD-10-CM

## 2022-11-04 DIAGNOSIS — K31819 Angiodysplasia of stomach and duodenum without bleeding: Secondary | ICD-10-CM

## 2022-11-04 HISTORY — PX: ESOPHAGOGASTRODUODENOSCOPY (EGD) WITH PROPOFOL: SHX5813

## 2022-11-04 HISTORY — DX: Angiodysplasia of stomach and duodenum without bleeding: K31.819

## 2022-11-04 LAB — BPAM RBC
Blood Product Expiration Date: 202311222359
ISSUE DATE / TIME: 202311210958
Unit Type and Rh: 9500

## 2022-11-04 LAB — KOH PREP

## 2022-11-04 LAB — TYPE AND SCREEN
ABO/RH(D): B NEG
Antibody Screen: NEGATIVE
Unit division: 0

## 2022-11-04 LAB — BASIC METABOLIC PANEL
Anion gap: 6 (ref 5–15)
BUN: 26 mg/dL — ABNORMAL HIGH (ref 8–23)
CO2: 29 mmol/L (ref 22–32)
Calcium: 8.2 mg/dL — ABNORMAL LOW (ref 8.9–10.3)
Chloride: 101 mmol/L (ref 98–111)
Creatinine, Ser: 1.01 mg/dL — ABNORMAL HIGH (ref 0.44–1.00)
GFR, Estimated: 57 mL/min — ABNORMAL LOW (ref >60)
Glucose, Bld: 181 mg/dL — ABNORMAL HIGH (ref 70–99)
Potassium: 3.1 mmol/L — ABNORMAL LOW (ref 3.5–5.1)
Sodium: 136 mmol/L (ref 135–145)

## 2022-11-04 LAB — HEMOGLOBIN: Hemoglobin: 8.9 g/dL — ABNORMAL LOW (ref 12.0–15.0)

## 2022-11-04 LAB — GLUCOSE, CAPILLARY
Glucose-Capillary: 172 mg/dL — ABNORMAL HIGH (ref 70–99)
Glucose-Capillary: 112 mg/dL — ABNORMAL HIGH (ref 70–99)

## 2022-11-04 SURGERY — ESOPHAGOGASTRODUODENOSCOPY (EGD) WITH PROPOFOL
Anesthesia: General

## 2022-11-04 MED ORDER — POTASSIUM CHLORIDE CRYS ER 20 MEQ PO TBCR: 40.0000 meq | EXTENDED_RELEASE_TABLET | ORAL | Status: DC

## 2022-11-04 MED ORDER — ATORVASTATIN CALCIUM 40 MG PO TABS: 40.0000 mg | ORAL_TABLET | Freq: Every day | ORAL | Status: AC

## 2022-11-04 MED ORDER — POTASSIUM CHLORIDE 10 MEQ/100ML IV SOLN
10.0000 meq | INTRAVENOUS | Status: DC
  Administered 2022-11-04: 10 meq via INTRAVENOUS
  Filled 2022-11-04 (×2): qty 100

## 2022-11-04 MED ORDER — PROPOFOL 10 MG/ML IV BOLUS
INTRAVENOUS | Status: DC | PRN
  Administered 2022-11-04: 50 mg via INTRAVENOUS

## 2022-11-04 MED ORDER — PROPOFOL 500 MG/50ML IV EMUL
INTRAVENOUS | Status: DC | PRN
  Administered 2022-11-04: 130 ug/kg/min via INTRAVENOUS

## 2022-11-04 MED ORDER — LIDOCAINE HCL (CARDIAC) PF 100 MG/5ML IV SOSY
PREFILLED_SYRINGE | INTRAVENOUS | Status: DC | PRN
  Administered 2022-11-04: 50 mg via INTRAVENOUS

## 2022-11-04 MED ORDER — POTASSIUM CHLORIDE CRYS ER 20 MEQ PO TBCR
40.0000 meq | EXTENDED_RELEASE_TABLET | Freq: Once | ORAL | Status: AC
  Administered 2022-11-04: 40 meq via ORAL
  Filled 2022-11-04: qty 2

## 2022-11-04 MED ORDER — SODIUM CHLORIDE 0.9 % IV SOLN: INTRAVENOUS | Status: DC | PRN

## 2022-11-04 NOTE — Care Management Important Message (Signed)
Important Message  Patient Details  Name: Kelly Rowland MRN: [id number] Date of Birth: 15-Jan-1941   Medicare Important Message Given:  Yes  Patient out of room for procedure upon time of of visit.  Confirmed copy of Medicare IM available in room for reference.    [name] 11/04/2022, 10:29 AM

## 2022-11-04 NOTE — Progress Notes (Signed)
Report given to [facility].

## 2022-11-04 NOTE — Discharge Summary (Signed)
Physician Discharge Summary   Patient: Kelly Rowland MRN: [id number] DOB: 1941/04/12  Admit date:     10/17/2022  Discharge date: 11/04/22  Discharge Physician: Marrion Coy   PCP: Lake Bells, MD   Recommendations at discharge:   Follow-up with PCP in 1 week. Dr. Wyn Quaker will bring patient back in 1 week for carotid endarterectomy. Check a CBC in 4 days. Follow-up with GI in 1 month. Treat with twice daily PPI for 3 months, then changed to daily.  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 *  Hospital Course: Kelly Rowland is a 81 y.o. 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  EGD was performed on 11/5, was found to have bleeding friable duodenal mucosa, treated with cauterization.  Patient developed acute respiratory failure with pulmonary edema after fluid resuscitation and blood transfusion.  Was treated with IV Lasix, echocardiogram showed normal heart function.   Patient developed acute stroke on 11/12, MRI showed acute/subacute infarcts in the left centrum semiovale, corona radiata and basal ganglia region in a watershed type distribution.  Neurology recommend aspirin 81 mg daily, avoid hypotension.  Carotid ultrasound showed 70 to 99% stenosis in the right internal carotid  arteries.  Vascular surgery consulted on 11/15, scheduled intervention on 11/20. Patient also treated steroids for possible acute gout with bilateral foot pain and ankle pain. Patient had an EGD on 11/22, still has small amount of oozing from duodenal ulcer, cauterized.  Also treated AVM in the stomach.  Hemoglobin is more stable since yesterday, medically stable to be transferred to SNF for rehab.   Assessment and Plan: [name], resolved Duodenal ulcer with GI bleed. Acute blood loss anemia. Patient is status post EGD, cauterization of bleeding.  Condition has improved, hemoglobin has been stabilized. Discussed with Dr. Tobi Bastos,  patient patient will need aspirin, Plavix and statin for carotid artery stent placement.  He is okay to restart Plavix.  However, patient hemoglobin dropped again to 7.8 after just 1 dose of Plavix, patient is too risky to start Plavix.   Patient was brought to the EGD today, duodenal ulcer seems to be better, but still has small amount of oozing from duodenal ulcer, cauterized.  Also treated for AVM in the stomach.  Hemoglobin has been better after receiving 1 unit of PRBC yesterday, hemoglobin up to 8.9. At this point, patient will need PPI twice a day for 3 months, then followed with daily treatment.  Patient will be followed by GI in 1 month.     acute hypoxic respiratory failure suspect acute pulmonary edema with volume overload PNA ruled in -- developed hypoxia on 11/5.  chest x-ray showed bilateral pulmonary edema.  Likely from fluid overload from IVF and blood transfusion --O2 requirement went up to 4L morning of 11/11, CXR suggested interstitial edema or  interstitial PNA.  Procal increased.  Completed 5 days of Levaquin. Condition has stabilized, antibiotic completed.   Acute stroke, not POA Carotid stenosis. --pt first reported weakness in right hand on 11/12, symptom had been present for about 2 days. --MRI brain 11/13 showed Acute/subacute  infarcts in the left centrum semiovale, corona radiata and basal ganglia region in a watershed type distribution. Discussed with Dr. Wyn Quaker, patient is not able to tolerate Plavix.  Still planning intervention on Monday, but no stent. Patient had angiogram performed 11/20, discussed with Dr. Wyn Quaker, patient will be brought back in 1 week for endarterectomy.   Bilateral foot and ankle pain Possible acute gout --this was present prior to arrival, intermittent with varying severity, and pt already had extensive outpatient workup with suggestions of gout, swelling or nerve as etiology of the pain.  Had MRI left ankle on 08/31/22, however, currently complained of severe right ankle pain. --pt was able to walk with PT on 10/18/22, but since around 10/22/22, pt has been complaining of severe bilateral foot pain and inability to tolerate any dorsi-flexion, let along bear weight on her feet.  Both ankles however look normal without erythema or edema. Pain is better today with added Lidoderm patch.  Continue as needed pain medicine and oral steroids  We will continue Lidoderm patch, continue 3 days oral steroids.  Continue oral pain medicine.   CHF ruled out Echo showed normal systolic and diastolic function   Chest pain, ACS ruled out history of CAD status post stent --trop neg.  Cardiology consulted, ruled out ACS.   Hypokalemia Hyponatremia Hypophosphatemia resolved.  CKD stage IIIb Renal function much improved and potassium is low again, she was given 10 mEq IV potassium before EGD, will give additional additional 40 mEq of oral potassium for discharge.  She will continue to 20 mEq of KCl daily.   Essential hypertension --Given the watershed distribution of her strokes, must avoid hypotension.  Would maintain a SBP goal of 130-150 for now.  --hold home coreg and amlodipine Blood pressure not elevated.   DM2 with hypoglycemia and hyperglycemia  We will continue current dose of insulin glargine as well  as started scheduled NovoLog. Hyperglycemia will be better after discontinuation of steroids.   Chronic back pain on chronic opioids Continue as needed pain medicine   Dysuria, resolved --resolved without abx.     Fever of unknown etiology, resolved --has intermittent fevers.  Blood cx neg so far.  Urine cx only 30,000 colonies.  COVID and RVP neg.            Consultants: GI, vascular surgery. Procedures performed: EGD, carotid angiogram. Disposition: Skilled nursing facility Diet recommendation:  Discharge Diet Orders (From admission, onward)     Start     Ordered   11/03/22 0000  Diet - low sodium heart healthy        11/03/22 1354           Cardiac diet DISCHARGE MEDICATION: Allergies as of 11/04/2022       Reactions   Amoxicillin Anaphylaxis   Lisinopril Anaphylaxis   Tramadol Nausea Only   Colchicine Nausea Only        Medication List     STOP taking these medications    glimepiride 4 MG tablet Commonly known as: AMARYL   NovoLIN N 100 UNIT/ML injection Generic drug: insulin NPH Human   simvastatin 20 MG tablet Commonly known as: ZOCOR   triamterene-hydrochlorothiazide 37.5-25 MG tablet Commonly known as: [employer]  TAKE these medications    amLODipine 5 MG tablet Commonly known as: NORVASC Take 5 mg by mouth daily.   aspirin EC 81 MG tablet Take 81 mg by mouth daily.   atorvastatin 40 MG tablet Commonly known as: LIPITOR Take 1 tablet (40 mg total) by mouth at bedtime.   carvedilol 25 MG tablet Commonly known as: COREG Take 25 mg by mouth 2 (two) times daily.   Cholecalciferol 50 MCG (2000 UT) Caps Take 1 capsule by mouth daily.   cyanocobalamin 500 MCG tablet Commonly known as: VITAMIN B12 Take 1 tablet by mouth daily.   fluticasone-salmeterol 250-50 MCG/ACT Aepb Commonly known as: ADVAIR Inhale 1 puff into the lungs 2 (two) times daily.   folic acid 400 MCG tablet Commonly known as: FOLVITE Take 1 tablet by  mouth daily.   furosemide 20 MG tablet Commonly known as: LASIX Take 1 tablet (20 mg total) by mouth daily. What changed: how much to take   gabapentin 100 MG capsule Commonly known as: NEURONTIN Take 100 mg by mouth at bedtime. Patients stats her orthopedic just prescribed this medication this past Thursday. She takes is every night at 0800PM   insulin glargine-yfgn 100 UNIT/ML injection Commonly known as: SEMGLEE Inject 0.12 mLs (12 Units total) into the skin daily.   Klor-Con M20 20 MEQ tablet Generic drug: potassium chloride SA Take 20 mEq by mouth daily.   lidocaine 5 % Commonly known as: LIDODERM Place 1 patch onto the skin daily. Remove & Discard patch within 12 hours or as directed by MD   oxyCODONE-acetaminophen 5-325 MG tablet Commonly known as: PERCOCET/ROXICET Take 1-2 tablets by mouth every 6 (six) hours as needed. What changed: reasons to take this   pantoprazole 40 MG tablet Commonly known as: PROTONIX Take 1 tablet (40 mg total) by mouth 2 (two) times daily.   predniSONE 20 MG tablet Commonly known as: DELTASONE Take 1 tablet (20 mg total) by mouth daily with breakfast for 3 days.   senna 8.6 MG Tabs tablet Commonly known as: SENOKOT Take 1 tablet (8.6 mg total) by mouth 2 (two) times daily.   Ventolin HFA 108 (90 Base) MCG/ACT inhaler Generic drug: albuterol Inhale 2 puffs into the lungs every 4 (four) hours as needed for shortness of breath, wheezing or cough.        Contact information for follow-up providers     Dew, Marlow Baars, MD. Go to.   Specialties: Vascular Surgery, Radiology, Interventional Cardiology Why: Per office Dr. Wyn Quaker spoke with patients sister about another procedure but no in office appointment is needed.  Dr. Wyn Quaker can bring patient back for carotid endarterectomy. Contact information: [address] [place] [address] [phone]         Toney Reil, MD Follow up in 1 month(s).   Specialty:  Gastroenterology Contact information: [address] [place] [address] [phone]              Contact information for after-discharge care     Destination     [facility] .   Service: Skilled Nursing Contact information: [address] [facility] [address] [phone]                    Discharge Exam: Filed Weights   10/17/22 1333  Weight: 65.3 kg   General exam: Appears calm and comfortable  Respiratory system: Clear to auscultation. Respiratory effort normal. Cardiovascular system: S1 & S2 heard, RRR. No  JVD, murmurs, rubs, gallops or clicks. No pedal edema. Gastrointestinal system: Abdomen is nondistended, soft and nontender. No organomegaly or masses felt. Normal bowel sounds heard. Central nervous system: Alert and oriented. No focal neurological deficits. Extremities: Symmetric 5 x 5 power. Skin: No rashes, lesions or ulcers Psychiatry: Judgement and insight appear normal. Mood & affect appropriate.    Condition at discharge: good  The results of significant diagnostics from this hospitalization (including imaging, microbiology, ancillary and laboratory) are listed below for reference.   Imaging Studies: PERIPHERAL VASCULAR CATHETERIZATION  Result Date: 11/02/2022 See surgical note for result.  US Carotid Bilateral  Result Date: 10/27/2022 CLINICAL DATA:  81 year old female with a history of stroke EXAM: BILATERAL CAROTID DUPLEX ULTRASOUND TECHNIQUE: Wallace Cullens scale imaging, color Doppler and duplex ultrasound were performed of bilateral carotid and vertebral arteries in the neck. COMPARISON:  None Available. FINDINGS: Criteria: Quantification of carotid stenosis is based on velocity parameters that correlate the residual internal carotid diameter with NASCET-based stenosis levels, using the diameter of the distal internal carotid lumen as the denominator for stenosis measurement. The following velocity  measurements were obtained: RIGHT ICA:  Systolic 149 cm/sec, Diastolic 32 cm/sec CCA:  71 cm/sec SYSTOLIC ICA/CCA RATIO:  2.1 ECA:  154 cm/sec LEFT ICA:  Systolic 491 cm/sec, Diastolic 175 cm/sec CCA:  34 cm/sec SYSTOLIC ICA/CCA RATIO:  14.4 ECA:  155 cm/sec Right Brachial SBP: Not acquired Left Brachial SBP: Not acquired RIGHT CAROTID ARTERY: No significant calcifications of the right common carotid artery. Intermediate waveform maintained. Moderate heterogeneous and partially calcified plaque at the right carotid bifurcation. Some shadowing present. Low resistance waveform of the right ICA. No significant tortuosity. RIGHT VERTEBRAL ARTERY: Antegrade flow with low resistance waveform. LEFT CAROTID ARTERY: No significant calcifications of the left common carotid artery. Intermediate waveform maintained. Moderate heterogeneous and partially calcified plaque at the left carotid bifurcation. Shadowing present. Low resistance waveform of the left ICA. No significant tortuosity. LEFT VERTEBRAL ARTERY:  Antegrade flow with low resistance waveform. 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 Left: Heterogeneous and partially calcified plaque at the left carotid bifurcation contributes to 70%-99% stenosis by established duplex criteria. Signed, Yvone Neu. Miachel Roux, RPVI [facility] Electronically Signed   By: Gilmer Mor D.O.   On: 10/27/2022 14:23   MR BRAIN WO CONTRAST  Result Date: 10/26/2022 CLINICAL DATA:  Neuro deficit, acute, stroke suspected. EXAM: MRI HEAD WITHOUT CONTRAST MRA HEAD WITHOUT CONTRAST TECHNIQUE: Multiplanar, multi-echo pulse sequences of the brain and surrounding structures were acquired without intravenous contrast. Angiographic images of the Circle of Willis were acquired using MRA technique without intravenous  contrast. COMPARISON:  Head CT October 25, 2022. FINDINGS: MRI HEAD FINDINGS The study is degraded by motion. Brain: Scattered foci of restricted diffusion are seen in the left centrum semiovale, corona radiata and basal ganglia region in a watershed type distribution. No evidence of hemorrhagic transformation. No hydrocephalus, extra-axial collection or mass lesion. Scattered and confluent foci of T2 hyperintensity are seen within the white matter of the cerebral hemispheres, nonspecific, most likely related to chronic small vessel ischemia. Vascular: Normal flow voids. Skull and upper cervical spine: Grossly unremarkable. Sinuses/Orbits: No acute or significant finding. Other: Right temporomandibular joint effusion. MRA HEAD FINDINGS The study is degraded by motion. Anterior circulation: Asymmetry in caliber of the visualized cervical and intracranial internal carotid arteries, with smaller caliber of the left ICA compared  to the right. Luminal irregularity is noted in the cavernous segment bilaterally, suggestive of intracranial atherosclerotic disease without hemodynamically significant stenosis. Flow related enhancement is maintained. Flow related enhancement is also preserved in the bilateral ACA and MCA vascular trees. Diffuse luminal irregularity related to motion artifacts limits evaluation for focal stenosis. Posterior circulation: Normal caliber flow related enhancement of the bilateral intracranial vertebral arteries and basilar artery. The bilateral posterior cerebral arteries are patent. Diffuse luminal irregularity related to motion artifacts limits evaluation for focal stenosis. Anatomic variants: None significant. IMPRESSION: 1. 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 2. Moderate chronic microvascular ischemic changes of the white matter. 3. Motion degraded MR angiogram of the head without evidence of  hemodynamically significant stenosis. Electronically Signed   By: Baldemar Lenis M.D.   On: 10/26/2022 15:02   MR ANGIO HEAD WO CONTRAST  Result Date: 10/26/2022 CLINICAL DATA:  Neuro deficit, acute, stroke suspected. EXAM: MRI HEAD WITHOUT CONTRAST MRA HEAD WITHOUT CONTRAST TECHNIQUE: Multiplanar, multi-echo pulse sequences of the brain and surrounding structures were acquired without intravenous contrast. Angiographic images of the Circle of Willis were acquired using MRA technique without intravenous contrast. COMPARISON:  Head CT October 25, 2022. FINDINGS: MRI HEAD FINDINGS The study is degraded by motion. Brain: Scattered foci of restricted diffusion are seen in the left centrum semiovale, corona radiata and basal ganglia region in a watershed type distribution. No evidence of hemorrhagic transformation. No hydrocephalus, extra-axial collection or mass lesion. Scattered and confluent foci of T2 hyperintensity are seen within the white matter of the cerebral hemispheres, nonspecific, most likely related to chronic small vessel ischemia. Vascular: Normal flow voids. Skull and upper cervical spine: Grossly unremarkable. Sinuses/Orbits: No acute or significant finding. Other: Right temporomandibular joint effusion. MRA HEAD FINDINGS The study is degraded by motion. Anterior circulation: Asymmetry in caliber of the visualized cervical and intracranial internal carotid arteries, with smaller caliber of the left ICA compared to the right. Luminal irregularity is noted in the cavernous segment bilaterally, suggestive of intracranial atherosclerotic disease without hemodynamically significant stenosis. Flow related enhancement is maintained. Flow related enhancement is also preserved in the bilateral ACA and MCA vascular trees. Diffuse luminal irregularity related to motion artifacts limits evaluation for focal stenosis. Posterior circulation: Normal caliber flow related enhancement of the  bilateral intracranial vertebral arteries and basilar artery. The bilateral posterior cerebral arteries are patent. Diffuse luminal irregularity related to motion artifacts limits evaluation for focal stenosis. Anatomic variants: None significant. IMPRESSION: 1. 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 2. Moderate chronic microvascular ischemic changes of the white matter. 3. Motion degraded MR angiogram of the head without evidence of hemodynamically significant stenosis. Electronically Signed   By: Baldemar Lenis M.D.   On: 10/26/2022 15:02   CT HEAD WO CONTRAST ( )  Result Date: 10/25/2022 CLINICAL DATA:  right hand weakness EXAM: CT HEAD WITHOUT CONTRAST TECHNIQUE: Contiguous axial images were obtained from the base of the skull through the vertex without intravenous contrast. RADIATION DOSE REDUCTION: This exam was performed according to the departmental dose-optimization program which includes automated exposure control, adjustment of the mA and/or kV according to patient size and/or use of iterative reconstruction technique. COMPARISON:  None Available. FINDINGS: Brain: Small age indeterminate infarct in the anterior limb the left internal capsule/caudate. No acute hemorrhage, mass lesion, midline shift or hydrocephalus. Patchy white matter hypodensities,  nonspecific but compatible with chronic microvascular ischemic disease. Vascular: No hyperdense vessel identified. Calcific atherosclerosis. Skull: No acute fracture. Sinuses/Orbits: Clear sinuses.  Knee surgery findings. IMPRESSION: 1. Small age indeterminate infarct in the anterior limb the left internal capsule/caudate, potentially acute. Recommend MRI to further evaluate. 2. Chronic microvascular ischemic disease. These results will be called to the ordering clinician or representative by the Radiologist Assistant, and communication documented in  the PACS or [employer]. Electronically Signed   By: Feliberto Harts M.D.   On: 10/25/2022 18:02   DG Chest Port 1 View  Result Date: 10/24/2022 CLINICAL DATA:  Hypoxia EXAM: PORTABLE CHEST 1 VIEW COMPARISON:  10/19/2022 FINDINGS: Transverse diameter of heart is increased. Thoracic aorta is tortuous and ectatic. There is improvement in aeration in right upper lung field. There is residual prominence of interstitial markings in left parahilar region and both lower lung fields. No new focal pulmonary consolidation is seen. Lateral CP angles are indistinct suggesting small effusions. There is no pneumothorax. IMPRESSION: Cardiomegaly. There is slight prominence of interstitial markings in left parahilar region and both lower lung fields suggesting interstitial edema or interstitial pneumonia. Possible small bilateral pleural effusions. Electronically Signed   By: Ernie Avena M.D.   On: 10/24/2022 14:28   ECHOCARDIOGRAM COMPLETE  Result Date: 10/19/2022    ECHOCARDIOGRAM REPORT   Patient Name:   MAYURI STAPLES Date of Exam: 10/19/2022 Medical Rec #:  [id number]     Height:       62.0 in Accession #:    [id number]    Weight:       144.0 lb Date of Birth:  December 04, 1941     BSA:          1.663 m Patient Age:    81 years      BP:           138/55 mmHg Patient Gender: F             HR:           81 bpm. Exam Location:  [facility] Procedure: 2D Echo, Color Doppler, Cardiac Doppler and 3D Echo Indications:     Acute Respiratory Distress R06.03  History:         Patient has no prior history of Echocardiogram examinations.                  CAD, COPD; Risk Factors:Hypertension, Dyslipidemia and                  Diabetes.  Sonographer:     Thurman Coyer RDCS Referring Phys:  [id number] SONA PATEL Diagnosing Phys: Lorine Bears MD IMPRESSIONS  1. Left ventricular ejection fraction, by estimation, is 55 to 60%. The left ventricle has normal function. The left ventricle has no regional wall motion abnormalities. There is  mild left ventricular hypertrophy. Left ventricular diastolic parameters were normal.  2. Right ventricular systolic function is normal. The right ventricular size is normal. Tricuspid regurgitation signal is inadequate for assessing PA pressure.  3. Left atrial size was mildly dilated.  4. The mitral valve is normal in structure. Mild mitral valve regurgitation. No evidence of mitral stenosis.  5. The aortic valve is normal in structure. Aortic valve regurgitation is not visualized. Aortic valve sclerosis is present, with no evidence of aortic valve stenosis.  6. The inferior vena cava is dilated in size with >50% respiratory variability, suggesting right atrial pressure of 8 mmHg. FINDINGS  Left Ventricle: Left ventricular ejection fraction, by estimation,  is 55 to 60%. The left ventricle has normal function. The left ventricle has no regional wall motion abnormalities. The left ventricular internal cavity size was normal in size. There is  mild left ventricular hypertrophy. Left ventricular diastolic parameters were normal. Right Ventricle: The right ventricular size is normal. No increase in right ventricular wall thickness. Right ventricular systolic function is normal. Tricuspid regurgitation signal is inadequate for assessing PA pressure. Left Atrium: Left atrial size was mildly dilated. Right Atrium: Right atrial size was normal in size. Pericardium: There is no evidence of pericardial effusion. Mitral Valve: The mitral valve is normal in structure. Mild mitral valve regurgitation. No evidence of mitral valve stenosis. Tricuspid Valve: The tricuspid valve is normal in structure. Tricuspid valve regurgitation is trivial. No evidence of tricuspid stenosis. Aortic Valve: The aortic valve is normal in structure. Aortic valve regurgitation is not visualized. Aortic valve sclerosis is present, with no evidence of aortic valve stenosis. Pulmonic Valve: The pulmonic valve was normal in structure. Pulmonic valve  regurgitation is not visualized. No evidence of pulmonic stenosis. Aorta: The aortic root is normal in size and structure. Venous: The inferior vena cava is dilated in size with greater than 50% respiratory variability, suggesting right atrial pressure of 8 mmHg. IAS/Shunts: No atrial level shunt detected by color flow Doppler.  LEFT VENTRICLE PLAX 2D LVIDd:         4.10 cm   Diastology LVIDs:         2.80 cm   LV e' medial:    8.81 cm/s LV PW:         1.20 cm   LV E/e' medial:  14.3 LV IVS:        1.20 cm   LV e' lateral:   8.49 cm/s LVOT diam:     2.10 cm   LV E/e' lateral: 14.8 LV SV:         84 LV SV Index:   51 LVOT Area:     3.46 cm                           3D Volume EF:                          3D EF:        65 %                          LV EDV:       137 ml                          LV ESV:       47 ml                          LV SV:        90 ml RIGHT VENTRICLE RV Basal diam:  3.60 cm RV Mid diam:    3.40 cm RV S prime:     14.30 cm/s TAPSE (M-mode): 2.5 cm LEFT ATRIUM             Index        RIGHT ATRIUM           Index LA diam:        3.40 cm 2.05 cm/m   RA Area:     13.20  cm LA Vol (A2C):   50.8 ml 30.55 ml/m  RA Volume:   29.20 ml  17.56 ml/m LA Vol (A4C):   54.4 ml 32.72 ml/m LA Biplane Vol: 55.7 ml 33.50 ml/m  AORTIC VALVE LVOT Vmax:   114.00 cm/s LVOT Vmean:  76.800 cm/s LVOT VTI:    0.243 m  AORTA Ao Root diam: 2.50 cm MITRAL VALVE MV Area (PHT): 3.89 cm     SHUNTS MV Decel Time: 195 msec     Systemic VTI:  0.24 m MV E velocity: 126.00 cm/s  Systemic Diam: 2.10 cm MV A velocity: 118.00 cm/s MV E/A ratio:  1.07 Muhammad Arida MD Electronically signed by Muhammad Arida MD Signature Date/Time: 10/19/2022/2:56:53 PM    [facility] HealtJ[facility] ReiningonEASLevell [facility]82m ilar atelectasis. Hepatobiliary: No focal hepatic lesion. No biliary duct dilatation. Gallbladder normal. Common bile duct normal. Pancreas: No pancreatic ductal dilatation. Pancreas is mildly atrophic. No mass lesion identified within the pancreas. No peripancreatic adenopathy. Spleen: Normal spleen. Adrenals/urinary tract: Bilobed simple fluid attenuation cystic lesion of the mid LEFT kidney measures 3.4 cm. There is a central thin calcified cyst wall between the bilobed cysts (image 20/2). Two nonobstructing calculi LEFT kidney. No ureterolithiasis proximally. Stomach/Bowel: Stomach and duodenum appear normal. Limited view of the small bowelcolon Unremarkable. Vascular/Lymphatic: Abdominal aorta is normal caliber with atherosclerotic calcification. There is no retroperitoneal or periportal lymphadenopathy. No pelvic lymphadenopathy. Musculoskeletal: No aggressive osseous lesion IMPRESSION: 1. No pancreatic mass identified. No pancreatic duct dilatation or common bile duct dilatation. 2. Mild pancreatic atrophy consistent with patient's age. 3. Normal liver without duct dilatation. 4. LEFT Bosniak II benign renal cyst measuring 3.4 cm. No follow-up imaging is recommended. JACR 2018 Feb; 264-273, Management of the Incidental Renal Mass on CT, RadioGraphics 2021; [id number], Bosniak Classification of Cystic Renal Masses, Version 2019. Electronically Signed   By: Stewart  Edmunds M.D.   On: 10/19/2022 10:49   DG Chest Port 1 View  Result Date: 10/19/2022 CLINICAL DATA:  Shortness of  breath. EXAM: PORTABLE CHEST 1 VIEW COMPARISON:  Chest radiographs 06/04/2020 FINDINGS: The cardiac silhouette is upper limits of normal in size. Aortic atherosclerosis is noted. There are increased interstitial densities throughout both lungs. No sizable pleural effusion or pneumothorax is identified. No acute osseous abnormality is seen. IMPRESSION: Bilateral interstitial densities which may reflect edema or viral/atypical infection. Electronically Signed   By: Allen  Grady M.D.   On: 10/19/2022 08:44    Microbiology: Results for orders placed or performed during the hospital encounter of 10/17/22  Culture, blood (Routine X 2) w Reflex to ID Panel     Status: None   Collection Time: 10/21/22 10:31 AM   Specimen: BLOOD  Result Value Ref Range Status   Specimen Description BLOOD BLOOD LEFT HAND  Final   Special Requests   Final    BOTTLES DRAWN AEROBIC AND ANAEROBIC Blood Culture adequate volume   Culture  Final    NO GROWTH 5 DAYS Performed at [facility], [address] [place], [place] [address]    Report Status 10/26/2022 FINAL  Final  Culture, blood (Routine X 2) w Reflex to ID Panel     Status: None   Collection Time: 10/21/22 10:31 AM   Specimen: BLOOD  Result Value Ref Range Status   Specimen Description BLOOD BLOOD RIGHT HAND  Final   Special Requests   Final    BOTTLES DRAWN AEROBIC AND ANAEROBIC Blood Culture adequate volume   Culture   Final    NO GROWTH 5 DAYS Performed at [facility], [address], [place], [place] [address]    Report Status 10/26/2022 FINAL  Final  Urine Culture     Status: Abnormal   Collection Time: 10/21/22  1:00 PM   Specimen: Urine, Random  Result Value Ref Range Status   Specimen Description   Final    URINE, RANDOM Performed at [facility], [address] [place], [place] [address]    Special Requests   Final    NONE Performed at [facility], [address], [place], [place] [address]     Culture 30,000 COLONIES/mL PROTEUS MIRABILIS (A)  Final   Report Status 10/23/2022 FINAL  Final   Organism ID, Bacteria PROTEUS MIRABILIS (A)  Final      Susceptibility   Proteus mirabilis - MIC*    AMPICILLIN <=2 SENSITIVE Sensitive     CEFAZOLIN <=4 SENSITIVE Sensitive     CEFEPIME <=0.12 SENSITIVE Sensitive     CEFTRIAXONE <=0.25 SENSITIVE Sensitive     CIPROFLOXACIN <=0.25 SENSITIVE Sensitive     GENTAMICIN <=1 SENSITIVE Sensitive     IMIPENEM 2 SENSITIVE Sensitive     NITROFURANTOIN 128 RESISTANT Resistant     TRIMETH/SULFA <=20 SENSITIVE Sensitive     AMPICILLIN/SULBACTAM <=2 SENSITIVE Sensitive     PIP/TAZO <=4 SENSITIVE Sensitive     * 30,000 COLONIES/mL PROTEUS MIRABILIS  SARS Coronavirus 2 by RT PCR (hospital order, performed in [facility] hospital lab) *cepheid single result test* Anterior Nasal Swab     Status: None   Collection Time: 10/22/22 12:18 PM   Specimen: Anterior Nasal Swab  Result Value Ref Range Status   SARS Coronavirus 2 by RT PCR NEGATIVE NEGATIVE Final    Comment: (NOTE) SARS-CoV-2 target nucleic acids are NOT DETECTED.  The SARS-CoV-2 RNA is generally detectable in upper and lower respiratory specimens during the acute phase of infection. The lowest concentration of SARS-CoV-2 viral copies this assay can detect is 250 copies / mL. A negative result does not preclude SARS-CoV-2 infection and should not be used as the sole basis for treatment or other patient management decisions.  A negative result may occur with improper specimen collection / handling, submission of specimen other than nasopharyngeal swab, presence of viral mutation(s) within the areas targeted by this assay, and inadequate number of viral copies (<250 copies / mL). A negative result must be combined with clinical observations, patient history, and epidemiological information.  Fact Sheet for Patients:   RoadLapTop.co.za  Fact Sheet for Healthcare  Providers: http://kim-miller.com/  This test is not yet approved or  cleared by the [place] FDA and has been authorized for detection and/or diagnosis of SARS-CoV-2 by FDA under an Emergency Use Authorization (EUA).  This EUA will remain in effect (meaning this test can be used) for the duration of the COVID-19 declaration under Section 564(b)(1) of the Act, 21 U.S.C.  section 360bbb-3(b)(1), unless the authorization is terminated or revoked sooner.  Performed at [facility], [address] [place], [place] [address]   Respiratory (~20 pathogens) panel by PCR     Status: None   Collection Time: 10/22/22 12:18 PM   Specimen: Nasopharyngeal Swab; Respiratory  Result Value Ref Range Status   Adenovirus NOT DETECTED NOT DETECTED Final   Coronavirus 229E NOT DETECTED NOT DETECTED Final    Comment: (NOTE) The Coronavirus on the Respiratory Panel, DOES NOT test for the novel  Coronavirus (2019 nCoV)    Coronavirus HKU1 NOT DETECTED NOT DETECTED Final   Coronavirus NL63 NOT DETECTED NOT DETECTED Final   Coronavirus OC43 NOT DETECTED NOT DETECTED Final   Metapneumovirus NOT DETECTED NOT DETECTED Final   Rhinovirus / Enterovirus NOT DETECTED NOT DETECTED Final   Influenza A NOT DETECTED NOT DETECTED Final   Influenza B NOT DETECTED NOT DETECTED Final   Parainfluenza Virus 1 NOT DETECTED NOT DETECTED Final   Parainfluenza Virus 2 NOT DETECTED NOT DETECTED Final   Parainfluenza Virus 3 NOT DETECTED NOT DETECTED Final   Parainfluenza Virus 4 NOT DETECTED NOT DETECTED Final   Respiratory Syncytial Virus NOT DETECTED NOT DETECTED Final   Bordetella pertussis NOT DETECTED NOT DETECTED Final   Bordetella Parapertussis NOT DETECTED NOT DETECTED Final   Chlamydophila pneumoniae NOT DETECTED NOT DETECTED Final   Mycoplasma pneumoniae NOT DETECTED NOT DETECTED Final    Comment: Performed at [facility], [address] [place], [place] [address]     Labs: CBC: Recent Labs  Lab 10/29/22 0602 10/30/22 0502 10/30/22 1314 10/31/22 0445 10/31/22 1720 11/02/22 0456 11/02/22 1718 11/03/22 0506 11/03/22 1648 11/04/22 0512  WBC 19.9* 17.7*  --  16.4*  --   --   --   --   --   --   HGB 8.4* 7.8*   < > 7.6*   < > 7.5* 7.1* 6.9* 8.9* 8.9*  HCT 26.1* 24.6*  --  24.0*  --   --   --   --   --   --   MCV 87.0 88.2  --  88.2  --   --   --   --   --   --   PLT 381 371  --  378  --   --   --   --   --   --    < > = values in this interval not displayed.   Basic Metabolic Panel: Recent Labs  Lab 10/29/22 0602 10/30/22 0502 10/31/22 0445 11/01/22 0447 11/02/22 0456 11/03/22 0506 11/04/22 0512  NA 134* 135 135 136 137 138 136  K 3.3* 3.7 3.2* 4.1 4.0 3.7 3.1*  CL 89* 95* 97* 101 101 104 101  CO2 33* 31 29 30 30 26 29   GLUCOSE 239* 157* 174* 193* 186* 172* 181*  BUN 47* 44* 42* 36* 30* 25* 26*  CREATININE 1.22* 1.13* 1.07* 1.02* 1.02* 0.95 1.01*  CALCIUM 8.4* 8.1* 8.0* 8.2* 8.2* 7.9* 8.2*  MG 2.4 2.5* 2.4 2.3  --  1.9  --    Liver Function Tests: No results for input(s): "AST", "ALT", "ALKPHOS", "BILITOT", "PROT", "ALBUMIN" in the last 168 hours. CBG: Recent Labs  Lab 11/02/22 2136 11/03/22 0741 11/03/22 1154 11/03/22 1725 11/04/22 0824  GLUCAP 217* 153* 299* 306* 172*    Discharge time spent: greater than 30 minutes.  Signed: 11/06/22, MD [facility] 11/04/2022

## 2022-11-04 NOTE — Plan of Care (Signed)

## 2022-11-04 NOTE — Progress Notes (Signed)
Occupational Therapy Treatment Patient Details Name: Kelly Rowland MRN: [id number] DOB: [birth year] Today's Date: 11/04/2022   History of present illness Pt is a 81 y/o F admitted on 10/17/22 after presenting to the ED with c/o generalized weakness, dizziness, & black tarry stools x 2 days. Pt is being treated for GI bleed. PMH: DJD, CAD, CKD, DM2, peptic ulcer disease, HTN, COPD. 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   OT comments  Chart reviewed, RN cleared pt for participation in OT tx session. Pt requires encouragement for participation, ultimately agreeable with education. Tx session targeted improving functional activity tolerance, participation in ADL tasks. Progress is noted throughout, specifically with mobility as pt is able to tolerate STS with MOD-MAX A, maintain for no more than 10 seconds. Grooming tasks completed with SET UP. Bed mobility with CGA-MIN A for management of BLEs. Pt is left as received, all needs met. Discharge recommendation remains appropriate.    Recommendations for follow up therapy are one component of a multi-disciplinary discharge planning process, led by the attending physician.  Recommendations may be updated based on patient status, additional functional criteria and insurance authorization.    Follow Up Recommendations  Skilled nursing-short term rehab (<3 hours/day)     Assistance Recommended at Discharge Frequent or constant Supervision/Assistance  Patient can return home with the following  A lot of help with bathing/dressing/bathroom;A lot of help with walking and/or transfers;Assistance with cooking/housework;Assist for transportation;Help with stairs or ramp for entrance;Assistance with feeding;Direct supervision/assist for medications management;Direct supervision/assist for financial management   Equipment Recommendations  Other (comment) (defer)    Recommendations for Other Services      Precautions / Restrictions Precautions Precautions:  Fall Restrictions Weight Bearing Restrictions: No       Mobility Bed Mobility Overal bed mobility: Needs Assistance Bed Mobility: Sit to Supine, Supine to Sit     Supine to sit: Min assist, Min guard Sit to supine: Min assist, Min guard   General bed mobility comments: managment of BLEs    Transfers Overall transfer level: Needs assistance Equipment used: 1 person hand held assist Transfers: Sit to/from Stand Sit to Stand: Mod assist, Max assist           General transfer comment: unable to maintain standing for more than 10 seconds     Balance Overall balance assessment: Needs assistance Sitting-balance support: Single extremity supported, Feet unsupported Sitting balance-Leahy Scale: Good       Standing balance-Leahy Scale: Poor                             ADL either performed or assessed with clinical judgement   ADL Overall ADL's : Needs assistance/impaired     Grooming: Sitting;Set up;Supervision/safety;Wash/dry face;Oral care Grooming Details (indicate cue type and reason): SET UP assist for application of polygrip             Lower Body Dressing: Maximal assistance Lower Body Dressing Details (indicate cue type and reason): socks     Toileting- Clothing Manipulation and Hygiene: Maximal assistance              Extremity/Trunk Assessment              Vision       Perception     Praxis      Cognition Arousal/Alertness: Awake/alert Behavior During Therapy: WFL for tasks assessed/performed Overall Cognitive Status: Within Functional Limits for tasks assessed  Exercises Other Exercises Other Exercises: edu re: importance of continued mobility, progressing mobility    Shoulder Instructions       General Comments      Pertinent Vitals/ Pain       Pain Assessment Pain Assessment: 0-10 Pain Score: 10-Worst pain ever Pain Location: BLE with  standing Pain Descriptors / Indicators: Aching, Sharp, Shooting Pain Intervention(s): Limited activity within patient's tolerance, Monitored during session, Premedicated before session, Repositioned  Home Living                                          Prior Functioning/Environment              Frequency  Min 2X/week        Progress Toward Goals  OT Goals(current goals can now be found in the care plan section)  Progress towards OT goals: Progressing toward goals     Plan Discharge plan remains appropriate;Frequency remains appropriate    Co-evaluation                 AM-PAC OT "6 Clicks" Daily Activity     Outcome Measure   Help from another person eating meals?: None Help from another person taking care of personal grooming?: A Little Help from another person toileting, which includes using toliet, bedpan, or urinal?: A Lot Help from another person bathing (including washing, rinsing, drying)?: A Lot Help from another person to put on and taking off regular upper body clothing?: A Little Help from another person to put on and taking off regular lower body clothing?: A Lot 6 Click Score: 16    End of Session Equipment Utilized During Treatment: Oxygen  OT Visit Diagnosis: Unsteadiness on feet (R26.81);Repeated falls (R29.6);Muscle weakness (generalized) (M62.81);Pain;Other symptoms and signs involving the nervous system (R29.898)   Activity Tolerance     Patient Left in bed;with call bell/phone within reach;with bed alarm set   Nurse Communication Mobility status        Time: 6073-7106 OT Time Calculation (min): 26 min  Charges: OT General Charges $OT Visit: 1 Visit OT Treatments $Self Care/Home Management : 8-22 mins $Therapeutic Activity: 8-22 mins  Shanon Payor, OTD OTR/L  11/04/22, 10:12 AM

## 2022-11-04 NOTE — Inpatient Diabetes Management (Signed)
Inpatient Diabetes Program Recommendations  AACE/ADA: New Consensus Statement on Inpatient Glycemic Control   Target Ranges:  Prepandial:   less than 140 mg/dL      Peak postprandial:   less than 180 mg/dL (1-2 hours)      Critically ill patients:  140 - 180 mg/dL    Latest Reference Range & Units 11/03/22 07:41 11/03/22 11:54 11/03/22 17:25 11/04/22 08:24  Glucose-Capillary 70 - 99 mg/dL 701 (H) 410 (H) 301 (H) 172 (H)    Latest Reference Range & Units 11/02/22 07:42 11/02/22 10:48 11/02/22 16:08 11/02/22 21:36  Glucose-Capillary 70 - 99 mg/dL 314 (H) 388 (H) 875 (H) 217 (H)   Review of Glycemic Control  Diabetes history: DM2 Outpatient Diabetes medications: NPH 24 units daily, Amaryl 4 mg daily Current orders for Inpatient glycemic control: Semglee 12 units daily, Novolog 0-15 units TID with meals; Prednisone 20 units QAM   Inpatient Diabetes Program Recommendations:     Insulin: Post prandial glucose is consistently elevated. If steroids continued as ordered, please consider ordering Novolog 3 units TID with meals for meal coverage if patient eats at least 50% of meals.  Thanks, Orlando Penner, RN, MSN, CDCES Diabetes Coordinator Inpatient Diabetes Program [phone] (Team Pager from 8am to 5pm)

## 2022-11-04 NOTE — Progress Notes (Signed)
Kelly Darby, MD [address]  [place]  [place], [place] [address]  Main: [phone]  Fax: [phone] Pager: [phone]   Subjective:  I was requested by Dr. Roosevelt Locks to see the patient yesterday prior to carotid endarterectomy.  Vascular surgery is planning this procedure in a week after she goes to rehab.  Patient denies any abdominal pain, nausea or vomiting.  She reports having dark brown bowel movements.  Her hemoglobin has been stable within last 24 hours.  Objective: Vital signs in last 24 hours: Vitals:   11/04/22 0241 11/04/22 0822 11/04/22 1020 11/04/22 1113  BP: (!) 154/53 (!) 154/54 (!) 144/56 (!) 131/51  Pulse: 76 78 81 79  Resp: 17 18 18 15   Temp: 97.9 F (36.6 C) 98.9 F (37.2 C) (!) 97.2 F (36.2 C) 98.1 F (36.7 C)  TempSrc: Oral  Temporal Temporal  SpO2: 96% 96% 98% 98%  Weight:      Height:       Weight change:   Intake/Output Summary (Last 24 hours) at 11/04/2022 1124 Last data filed at 11/04/2022 1109 Gross per 24 hour  Intake 1143.83 ml  Output 1250 ml  Net -106.17 ml     Exam: Heart:: Regular rate and rhythm, S1S2 present, or without murmur or extra heart sounds Lungs: normal and clear to auscultation Abdomen: soft, nontender, normal bowel sounds   Lab Results:    Latest Ref Rng & Units 11/04/2022    5:12 AM 11/03/2022    4:48 PM 11/03/2022    5:06 AM  CBC  Hemoglobin 12.0 - 15.0 g/dL 8.9  8.9  6.9       Latest Ref Rng & Units 11/04/2022    5:12 AM 11/03/2022    5:06 AM 11/02/2022    4:56 AM  CMP  Glucose 70 - 99 mg/dL 181  172  186   BUN 8 - 23 mg/dL 26  25  30    Creatinine 0.44 - 1.00 mg/dL 1.01  0.95  1.02   Sodium 135 - 145 mmol/L 136  138  137   Potassium 3.5 - 5.1 mmol/L 3.1  3.7  4.0   Chloride 98 - 111 mmol/L 101  104  101   CO2 22 - 32 mmol/L 29  26  30    Calcium 8.9 - 10.3 mg/dL 8.2  7.9  8.2     Micro Results: No results found for this or any previous visit (from the past 240  hour(s)). Studies/Results: PERIPHERAL VASCULAR CATHETERIZATION  Result Date: 11/02/2022 See surgical note for result.  Medications: I have reviewed the patient's current medications. Prior to Admission:  Medications Prior to Admission  Medication Sig Dispense Refill Last Dose   amLODipine (NORVASC) 5 MG tablet Take 5 mg by mouth daily.  12 10/17/2022   aspirin 81 MG EC tablet Take 81 mg by mouth daily.   10/17/2022   carvedilol (COREG) 25 MG tablet Take 25 mg by mouth 2 (two) times daily.   10/17/2022   Cholecalciferol 50 MCG (2000 UT) CAPS Take 1 capsule by mouth daily.   10/16/2022   fluticasone-salmeterol (ADVAIR) 250-50 MCG/ACT AEPB Inhale 1 puff into the lungs 2 (two) times daily.   [id number]   folic acid (FOLVITE) A999333 MCG tablet Take 1 tablet by mouth daily.   10/17/2022   gabapentin (NEURONTIN) 100 MG capsule Take 100 mg by mouth at bedtime. Patients stats her orthopedic just prescribed this medication this past Thursday. She takes is every night at  0800PM   10/16/2022 at 2000   glimepiride (AMARYL) 4 MG tablet Take 4 mg by mouth daily.   10/16/2022   KLOR-CON M20 20 MEQ tablet Take 20 mEq by mouth daily.   10/17/2022   NOVOLIN N 100 UNIT/ML injection Inject 24 Units into the skin daily.   10/17/2022   simvastatin (ZOCOR) 20 MG tablet Take 20 mg by mouth at bedtime.   10/16/2022   triamterene-hydrochlorothiazide (MAXZIDE-25) 37.5-25 MG tablet Take 1 tablet by mouth daily.   10/17/2022   vitamin B-12 (CYANOCOBALAMIN) 500 MCG tablet Take 1 tablet by mouth daily.   10/16/2022   [DISCONTINUED] furosemide (LASIX) 20 MG tablet Take 20-40 mg by mouth daily.   10/17/2022   [DISCONTINUED] oxyCODONE-acetaminophen (PERCOCET/ROXICET) 5-325 MG tablet Take 1-2 tablets by mouth every 6 (six) hours as needed for pain.  0 10/17/2022 at 600   VENTOLIN HFA 108 (90 Base) MCG/ACT inhaler Inhale 2 puffs into the lungs every 4 (four) hours as needed for shortness of breath, wheezing or cough.   prn   Scheduled:  [MAR  Hold] aspirin EC  81 mg Oral Daily   [MAR Hold] atorvastatin  40 mg Oral QHS   [MAR Hold] cholecalciferol  2,000 Units Oral Daily   [MAR Hold] cyanocobalamin  500 mcg Oral Daily   [MAR Hold] enoxaparin (LOVENOX) injection  40 mg Subcutaneous Q24H   [MAR Hold] feeding supplement  237 mL Oral TID BM   [MAR Hold] folic acid  1,000 mcg Oral Daily   [MAR Hold] gabapentin  200 mg Oral TID   [MAR Hold] insulin aspart  0-15 Units Subcutaneous TID WC   [MAR Hold] insulin glargine-yfgn  12 Units Subcutaneous Daily   [MAR Hold] lidocaine  1 patch Transdermal Q24H   [MAR Hold] mometasone-formoterol  2 puff Inhalation BID   [MAR Hold] pantoprazole  40 mg Oral BID   [MAR Hold] predniSONE  20 mg Oral Q breakfast   [MAR Hold] senna  1 tablet Oral BID   Continuous: PRN:[MAR Hold] acetaminophen **OR** [MAR Hold] acetaminophen, [MAR Hold] albuterol, [MAR Hold] bisacodyl, [MAR Hold] hydrALAZINE, [MAR Hold] ondansetron **OR** [MAR Hold] ondansetron (ZOFRAN) IV, [MAR Hold] ondansetron (ZOFRAN) IV, [MAR Hold] mouth rinse, [MAR Hold] oxyCODONE, [MAR Hold] polyethylene glycol, [MAR Hold] traZODone Anti-infectives (From admission, onward)    Start     Dose/Rate Route Frequency Ordered Stop   11/02/22 0600  vancomycin (VANCOCIN) IVPB 1000 mg/200 mL premix        1,000 mg 200 mL/hr over 60 Minutes Intravenous On call to O.R. 11/01/22 2355 11/02/22 1303   10/28/22 1000  levofloxacin (LEVAQUIN) tablet 250 mg        250 mg Oral Daily 10/28/22 1013 10/29/22 0931   10/25/22 1045  Levofloxacin (LEVAQUIN) IVPB 250 mg  Status:  Discontinued        250 mg 50 mL/hr over 60 Minutes Intravenous Every 24 hours 10/25/22 0954 10/28/22 1013      Scheduled Meds:  [MAR Hold] aspirin EC  81 mg Oral Daily   [MAR Hold] atorvastatin  40 mg Oral QHS   [MAR Hold] cholecalciferol  2,000 Units Oral Daily   [MAR Hold] cyanocobalamin  500 mcg Oral Daily   [MAR Hold] enoxaparin (LOVENOX) injection  40 mg Subcutaneous Q24H   [MAR Hold]  feeding supplement  237 mL Oral TID BM   [MAR Hold] folic acid  1,000 mcg Oral Daily   [MAR Hold] gabapentin  200 mg Oral TID   [MAR Hold] insulin  aspart  0-15 Units Subcutaneous TID WC   [MAR Hold] insulin glargine-yfgn  12 Units Subcutaneous Daily   [MAR Hold] lidocaine  1 patch Transdermal Q24H   [MAR Hold] mometasone-formoterol  2 puff Inhalation BID   [MAR Hold] pantoprazole  40 mg Oral BID   [MAR Hold] predniSONE  20 mg Oral Q breakfast   [MAR Hold] senna  1 tablet Oral BID   Continuous Infusions: PRN Meds:.[MAR Hold] acetaminophen **OR** [MAR Hold] acetaminophen, [MAR Hold] albuterol, [MAR Hold] bisacodyl, [MAR Hold] hydrALAZINE, [MAR Hold] ondansetron **OR** [MAR Hold] ondansetron (ZOFRAN) IV, [MAR Hold] ondansetron (ZOFRAN) IV, [MAR Hold] mouth rinse, [MAR Hold] oxyCODONE, [MAR Hold] polyethylene glycol, [MAR Hold] traZODone   Assessment: Principal Problem:   GI bleed Active Problems:   Hypokalemia   Normocytic anemia   UGIB (upper gastrointestinal bleed)   Acute upper GI bleeding   Duodenal ulcer with hemorrhage   Hematochezia   Acute blood loss anemia   Chronic kidney disease, stage 3b (HCC)   Hyponatremia   Carotid stenosis, symptomatic, with infarction ([place])   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  Patient's hemoglobin dropped on 11/21 to 6.9 from 7.9, received 2 units of PRBCs, improved to 8.9, has been stable at 8.9 within last 24 hours.  I was requested to see this patient because of drop in hemoglobin and to assess state of ulcer prior to carotid endarterectomy  Plan: EGD today showed approximately 2 cm duodenal ulcer with mild oozing, treated with  cautery.  She also had 4 mm nonbleeding AVM in the gastric fundus, treated with APC If patient develops recurrent bleed from duodenal ulcer, recommend embolization as the next step.  She already had 3 endoscopic interventions of this ulcer Continue omeprazole 40 mg twice daily before meals indefinitely Monitor CBC closely as outpatient Recommend close GI follow-up in 2 to 4 weeks Patient can proceed with carotid endarterectomy in 1 to 2 weeks She can be discharged to rehab today   LOS: 18 days   Jaxsyn Catalfamo 11/04/2022, 11:24 AM

## 2022-11-04 NOTE — Anesthesia Preprocedure Evaluation (Addendum)
Anesthesia Evaluation  Patient identified by MRN, date of birth, ID band Patient awake    Reviewed: Allergy & Precautions, H&P , NPO status , Patient's Chart, lab work & pertinent test results, reviewed documented beta blocker date and time   Airway Mallampati: II   Neck ROM: full    Dental  (+) Edentulous Upper, Edentulous Lower   Pulmonary COPD, former smoker   Pulmonary exam normal        Cardiovascular Exercise Tolerance: Poor hypertension, Pt. on medications + CAD and + Peripheral Vascular Disease  Normal cardiovascular exam Rhythm:regular Rate:Normal     Neuro/Psych 10/25/2022 Acute Stroke  10/27/22 US Carotid: IMPRESSION: Right:  Heterogeneous and partially calcified plaque at the right carotid bifurcation contributes to 50%-69% stenosis by established duplex criteria.   Left:  Heterogeneous and partially calcified plaque at the left carotid bifurcation contributes to 70%-99% stenosis by established duplex criteria.  10/26/22 MRI Brain: IMPRESSION: 1. 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 2. Moderate chronic microvascular ischemic changes of the white matter. 3. Motion degraded MR angiogram of the head without evidence of hemodynamically significant stenosis.    CVA  negative psych ROS   GI/Hepatic negative GI ROS, Neg liver ROS,,,  Endo/Other  diabetes, Well Controlled    Renal/GU Renal diseasenegative Renal ROS  negative genitourinary   Musculoskeletal  (+) Arthritis ,    Abdominal   Peds  Hematology negative hematology ROS (+) Blood dyscrasia, anemia   Anesthesia Other Findings Past Medical History: No date: Arthritis No date: CAD (coronary artery disease) No date: Chronic kidney disease No date: COPD (chronic obstructive pulmonary disease) (HCC) No date: Diabetes mellitus without complication  (HCC) No date: Hyperlipidemia No date: Hypertension No date: Restless legs syndrome (RLS) Past Surgical History: No date: ABDOMINAL HYSTERECTOMY No date: APPENDECTOMY 2019: CATARACT EXTRACTION; Bilateral No date: TONSILLECTOMY BMI    Body Mass Index: 26.34 kg/m     Reproductive/Obstetrics negative OB ROS                             Anesthesia Physical Anesthesia Plan  ASA: 4  Anesthesia Plan: General   Post-op Pain Management: Minimal or no pain anticipated   Induction: Intravenous  PONV Risk Score and Plan: 3 and Propofol infusion, TIVA and Ondansetron  Airway Management Planned: Nasal Cannula  Additional Equipment: None  Intra-op Plan:   Post-operative Plan:   Informed Consent: I have reviewed the patients History and Physical, chart, labs and discussed the procedure including the risks, benefits and alternatives for the proposed anesthesia with the patient or authorized representative who has indicated his/her understanding and acceptance.     Dental Advisory Given  Plan Discussed with: CRNA  Anesthesia Plan Comments: (IVGA, PIV x 1, ASA SM, RBO discussed, Consent signed.)       Anesthesia Quick Evaluation

## 2022-11-04 NOTE — Op Note (Signed)
[facility]astro[facility]ology Patient Name: Kelly Rowland Procedure Date: 11/04/2022 10:40 AM MRN: [id number] Account #: [id number] Date of Birth: [birth year] Admit Type: Outpatient Age: 81 Room: [facility] ENDO ROOM 4 Gender: Female Note Status: Finalized Instrument Name: Upper Endoscope [id number] Procedure:             Upper GI endoscopy Indications:           Melena, Recent gastrointestinal bleeding, Follow-up of                         acute duodenal ulcer with hemorrhage Providers:             Toney Reil MD, MD Medicines:             General Anesthesia Complications:         No immediate complications. Estimated blood loss: None. Procedure:             Pre-Anesthesia Assessment:                        - Prior to the procedure, a History and Physical was                         performed, and patient medications and allergies were                         reviewed. The patient is competent. The risks and                         benefits of the procedure and the sedation options and                         risks were discussed with the patient. All questions                         were answered and informed consent was obtained.                         Patient identification and proposed procedure were                         verified by the physician, the nurse, the                         anesthesiologist, the anesthetist and the technician                         in the pre-procedure area in the procedure room in the                         endoscopy suite. Mental Status Examination: alert and                         oriented. Airway Examination: normal oropharyngeal                         airway and neck mobility. Respiratory Examination:  clear to auscultation. CV Examination: normal.                         Prophylactic Antibiotics: The patient does not require                         prophylactic antibiotics. Prior Anticoagulants:  The                         patient has taken no anticoagulant or antiplatelet                         agents. ASA Grade Assessment: III - A patient with                         severe systemic disease. After reviewing the risks and                         benefits, the patient was deemed in satisfactory                         condition to undergo the procedure. The anesthesia                         plan was to use general anesthesia. Immediately prior                         to administration of medications, the patient was                         re-assessed for adequacy to receive sedatives. The                         heart rate, respiratory rate, oxygen saturations,                         blood pressure, adequacy of pulmonary ventilation, and                         response to care were monitored throughout the                         procedure. The physical status of the patient was                         re-assessed after the procedure.                        After obtaining informed consent, the endoscope was                         passed under direct vision. Throughout the procedure,                         the patient's blood pressure, pulse, and oxygen                         saturations were monitored continuously. The Endoscope  was introduced through the mouth, and advanced to the                         second part of duodenum. The upper GI endoscopy was                         accomplished without difficulty. The patient tolerated                         the procedure well. Findings:      One oozing superficial duodenal ulcer with oozing hemorrhage ([place]       Class Ib) was found in the second portion of the duodenum. The lesion       was 20 mm in largest dimension. Coagulation for hemostasis using bipolar       probe was successful. Estimated blood loss: none.      A single 4 mm angioectasia with no bleeding was found in the gastric        fundus. Coagulation for hemostasis using argon plasma at 0.8       liters/minute and 60 watts was successful. Estimated blood loss: none.      White nummular lesions were noted in the entire esophagus. Cells for       cytology were obtained by brushing. Estimated blood loss: none. Impression:            - Oozing duodenal ulcer with oozing hemorrhage                         ([place] Class Ib). Treated with bipolar cautery.                        - A single non-bleeding angioectasia in the stomach.                         Treated with argon plasma coagulation (APC).                        - White nummular lesions in esophageal mucosa. Cells                         for cytology obtained. Recommendation:        - Return patient to hospital ward for ongoing care.                        - Resume regular diet today.                        - Continue present medications.                        - Use Prilosec (omeprazole) 40 mg PO BID indefinitely.                        - Return to GI clinic in 4 weeks. 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 All rights reserved. The codes documented in this report are preliminary and upon coder review may  be revised to meet current compliance  requirements. Dr. Libby Mawonini Benjamin Merrihew Toney Reilohini Reddy Aydin Cavalieri MD, MD 11/04/2022 11:11:35 AM This report has been signed electronically. Number of Addenda: 0 Note Initiated On: 11/04/2022 10:40 AM Estimated Blood Loss:  Estimated blood loss: none.      [facility]

## 2022-11-04 NOTE — TOC Transition Note (Signed)
Transition of Care [facility]) - CM/SW Discharge Note   Patient Details  Name: Delicia Berens MRN: [id number] Date of Birth: [birth year]  [facility]) CM/SW Contact:  Truddie Hidden, RN Phone Number: 11/04/2022, 12:20 PM   Clinical Narrative:    Spoke with [name] in admissions at [facility]. Patient admission confirmed for today. Patient assigned room E-14. Nurse will call report to [phone] MD notified. Discharge order received.  Family notified. Discharge summary and transfer report sent in HUB. Face sheet and medical necessity forms printed to floor to be added to EMS packet.  EMS scheduled.  Nurse provided information for room and where to call report.  TOC signing off.        Barriers to Discharge: Continued Medical Work up   Patient Goals and CMS Choice Patient states their goals for this hospitalization and ongoing recovery are:: home with home health CMS Medicare.gov Compare Post Acute Care list provided to:: Patient Choice offered to / list presented to : Patient  Discharge Placement                       Discharge Plan and Services                          HH Arranged: PT [facility] Agency: [facility] Date [facility] Agency Contacted: 10/18/22   Representative spoke with at [facility] Agency: [name]  Social Determinants of Health (SDOH) Interventions     Readmission Risk Interventions     No data to display

## 2022-11-04 NOTE — Anesthesia Procedure Notes (Signed)
Procedure Name: MAC Date/Time: 11/04/2022 10:48 AM  Performed by: Tollie Eth, CRNAPre-anesthesia Checklist: Patient identified, Emergency Drugs available, Suction available and Patient being monitored Patient Re-evaluated:Patient Re-evaluated prior to induction Oxygen Delivery Method: Nasal cannula Induction Type: IV induction Placement Confirmation: positive ETCO2

## 2022-11-04 NOTE — Transfer of Care (Signed)
Immediate Anesthesia Transfer of Care Note  Patient: Kelly Rowland  Procedure(s) Performed: ESOPHAGOGASTRODUODENOSCOPY (EGD) WITH PROPOFOL  Patient Location: Endoscopy Unit  Anesthesia Type:General  Level of Consciousness: awake, alert , and oriented  Airway & Oxygen Therapy: Patient Spontanous Breathing and Patient connected to nasal cannula oxygen  Post-op Assessment: Report given to RN and Post -op Vital signs reviewed and stable  Post vital signs: Reviewed and stable  Last Vitals:  Vitals Value Taken Time  BP 131/51 11/04/22 1114  Temp 36.7 C 11/04/22 1113  Pulse 79 11/04/22 1115  Resp 18 11/04/22 1115  SpO2 89 % 11/04/22 1115  Vitals shown include unvalidated device data.  Last Pain:  Vitals:   11/04/22 1113  TempSrc: Temporal  PainSc: Asleep      Patients Stated Pain Goal: 2 (11/03/22 1133)  Complications: No notable events documented.

## 2022-11-04 NOTE — Anesthesia Postprocedure Evaluation (Signed)
Anesthesia Post Note  Patient: Madasyn Heath  Procedure(s) Performed: ESOPHAGOGASTRODUODENOSCOPY (EGD) WITH PROPOFOL  Patient location during evaluation: PACU Anesthesia Type: General Level of consciousness: awake and alert Pain management: pain level controlled Vital Signs Assessment: post-procedure vital signs reviewed and stable Respiratory status: spontaneous breathing, nonlabored ventilation, respiratory function stable and patient connected to nasal cannula oxygen Cardiovascular status: stable and blood pressure returned to baseline Postop Assessment: no apparent nausea or vomiting Anesthetic complications: no   No notable events documented.   Last Vitals:  Vitals:   11/04/22 1134 11/04/22 1143  BP: 115/86 107/79  Pulse: 73 70  Resp: 16 20  Temp:    SpO2: 98% 98%    Last Pain:  Vitals:   11/04/22 1143  TempSrc:   PainSc: 0-No pain                 Lannette Donath

## 2022-11-09 ENCOUNTER — Other Ambulatory Visit (INDEPENDENT_AMBULATORY_CARE_PROVIDER_SITE_OTHER): Payer: Self-pay | Admitting: Nurse Practitioner

## 2022-11-09 ENCOUNTER — Other Ambulatory Visit: Payer: Self-pay

## 2022-11-09 ENCOUNTER — Encounter
Admission: RE | Admit: 2022-11-09 | Discharge: 2022-11-09 | Disposition: A | Discharge status: Home / Self Care | Payer: Medicare Other | Source: Ambulatory Visit | Attending: Vascular Surgery | Admitting: Vascular Surgery

## 2022-11-09 ENCOUNTER — Telehealth (INDEPENDENT_AMBULATORY_CARE_PROVIDER_SITE_OTHER): Payer: Self-pay

## 2022-11-09 ENCOUNTER — Encounter: Payer: Self-pay | Admitting: Urgent Care

## 2022-11-09 ENCOUNTER — Encounter: Payer: Self-pay | Admitting: Certified Registered"

## 2022-11-09 ENCOUNTER — Encounter: Payer: Self-pay | Admitting: Vascular Surgery

## 2022-11-09 DIAGNOSIS — I63239 Cerebral infarction due to unspecified occlusion or stenosis of unspecified carotid arteries: Secondary | ICD-10-CM

## 2022-11-09 DIAGNOSIS — Z01812 Encounter for preprocedural laboratory examination: Secondary | ICD-10-CM

## 2022-11-09 HISTORY — DX: Muscle weakness (generalized): M62.81

## 2022-11-09 HISTORY — DX: Constipation, unspecified: K59.00

## 2022-11-09 HISTORY — DX: Anemia, unspecified: D64.9

## 2022-11-09 HISTORY — DX: Pain, unspecified: R52

## 2022-11-09 HISTORY — DX: Acute respiratory failure, unspecified whether with hypoxia or hypercapnia: J96.00

## 2022-11-09 HISTORY — DX: Gout, unspecified: M10.9

## 2022-11-09 NOTE — Telephone Encounter (Signed)
[name] at [facility] called and the patient is scheduled with Dr. Wyn Quaker for a left carotid endarterectomy on 11/11/22 at the [facility]. Pre-op phone call is today at 2:00 pm. Pre-surgical instructions were discussed and will be faxed to [name] at [facility].

## 2022-11-09 NOTE — Pre-Procedure Instructions (Signed)
Patient residing at [facility] , spoke with [facility] and she will fax me patients latest MAR, once medications are updated I will fax her instructions- pre op, Kelly [facility]land patients son assisted me in completing her PAT information.

## 2022-11-09 NOTE — Patient Instructions (Addendum)
Your procedure is scheduled on: 11/11/22 - Wednesday Report to the Registration Desk on the 1st floor of the [facility]. To find out your arrival time, please call [phone] between 1PM - 3PM on: 11/10/22 - Tuesday If your arrival time is 6:00 am, do not arrive prior to that time as the [facility] entrance doors do not open until 6:00 am.  REMEMBER: Instructions that are not followed completely may result in serious medical risk, up to and including death; or upon the discretion of your surgeon and anesthesiologist your surgery may need to be rescheduled.  Do not eat food or drink any fluids after midnight the night before surgery.  No gum chewing, lozengers or hard candies.   TAKE THESE MEDICATIONS THE MORNING OF SURGERY WITH A SIP OF WATER:  - amLODipine (NORVASC)  - carvedilol (COREG)  - fluticasone-salmeterol (ADVAIR)  - KLOR-CON M20  - pantoprazole (PROTONIX)  - VENTOLIN HFA use on the morning of surgery and bring to the hospital.  insulin glargine-yfgn (SEMGLEE) - HOLD MORNING DOSE ON THE DAY OF SURGERY   One week prior to surgery: Stop Anti-inflammatories (NSAIDS) such as Advil, Aleve, Ibuprofen, Motrin, Naproxen, Naprosyn and Aspirin based products such as Excedrin, Goodys Powder, BC Powder.  Stop ANY OVER THE COUNTER supplements until after surgery.  You may however, continue to take Tylenol if needed for pain up until the day of surgery.  No Alcohol for 24 hours before or after surgery.  No Smoking including e-cigarettes for 24 hours prior to surgery.  No chewable tobacco products for at least 6 hours prior to surgery.  No nicotine patches on the day of surgery.  Do not use any "recreational" drugs for at least a week prior to your surgery.  Please be advised that the combination of cocaine and anesthesia may have negative outcomes, up to and including death. If you test positive for cocaine, your surgery will be cancelled.  On the morning of surgery brush  your teeth with toothpaste and water, you may rinse your mouth with mouthwash if you wish. Do not swallow any toothpaste or mouthwash.  Do not wear jewelry, make-up, hairpins, clips or nail polish.  Do not wear lotions, powders, or perfumes.   Do not shave body from the neck down 48 hours prior to surgery just in case you cut yourself which could leave a site for infection.  Also, freshly shaved skin may become irritated if using the CHG soap.  Contact lenses, hearing aids and dentures may not be worn into surgery.  Do not bring valuables to the hospital. [facility] is not responsible for any missing/lost belongings or valuables.   Notify your doctor if there is any change in your medical condition (cold, fever, infection).  Wear comfortable clothing (specific to your surgery type) to the hospital.  After surgery, you can help prevent lung complications by doing breathing exercises.  Take deep breaths and cough every 1-2 hours. Your doctor may order a device called an Incentive Spirometer to help you take deep breaths. When coughing or sneezing, hold a pillow firmly against your incision with both hands. This is called "splinting." Doing this helps protect your incision. It also decreases belly discomfort.  If you are being admitted to the hospital overnight, leave your suitcase in the car. After surgery it may be brought to your room.  If you are being discharged the day of surgery, you will not be allowed to drive home. You will need a responsible adult (  18 years or older) to drive you home and stay with you that night.   If you are taking public transportation, you will need to have a responsible adult (18 years or older) with you. Please confirm with your physician that it is acceptable to use public transportation.   Please call the Pre-admissions Testing Dept. at [phone] if you have any questions about these instructions.  Surgery Visitation Policy:  Patients  undergoing a surgery or procedure may have two family members or support persons with them as long as the person is not COVID-19 positive or experiencing its symptoms.   Inpatient Visitation:    Visiting hours are 7 a.m. to 8 p.m. Up to four visitors are allowed at one time in a patient room. The visitors may rotate out with other people during the day. One designated support person (adult) may remain overnight.  MASKING: Due to an increase in RSV rates and hospitalizations, starting Wednesday, Nov. 15, in patient care areas in which we serve newborns, infants and children, masks will be required for teammates and visitors.  Children ages 15 and under may not visit. This policy affects the following departments only:  [facility] Postpartum area Mother Baby Unit Newborn nursery/Special care nursery  Other areas: Masks continue to be strongly recommended for patient-facing teammates, visitors and patients in all other areas. Visitation is not restricted outside of the units listed above.

## 2022-11-11 ENCOUNTER — Encounter
Admission: RE | Disposition: A | Discharge status: Rehabilitation Facility | Payer: Self-pay | Source: Ambulatory Visit | Attending: Vascular Surgery

## 2022-11-11 ENCOUNTER — Ambulatory Visit
Admission: RE | Admit: 2022-11-11 | Discharge: 2022-11-11 | Disposition: A | Discharge status: Rehabilitation Facility | Payer: No Typology Code available for payment source | Source: Ambulatory Visit | Attending: Vascular Surgery | Admitting: Vascular Surgery

## 2022-11-11 ENCOUNTER — Encounter: Payer: Self-pay | Admitting: Vascular Surgery

## 2022-11-11 ENCOUNTER — Other Ambulatory Visit: Payer: Self-pay

## 2022-11-11 DIAGNOSIS — K31819 Angiodysplasia of stomach and duodenum without bleeding: Secondary | ICD-10-CM | POA: Insufficient documentation

## 2022-11-11 DIAGNOSIS — K2289 Other specified disease of esophagus: Secondary | ICD-10-CM | POA: Diagnosis not present

## 2022-11-11 DIAGNOSIS — I63239 Cerebral infarction due to unspecified occlusion or stenosis of unspecified carotid arteries: Secondary | ICD-10-CM | POA: Diagnosis not present

## 2022-11-11 DIAGNOSIS — K260 Acute duodenal ulcer with hemorrhage: Secondary | ICD-10-CM | POA: Diagnosis not present

## 2022-11-11 DIAGNOSIS — K921 Melena: Secondary | ICD-10-CM | POA: Insufficient documentation

## 2022-11-11 DIAGNOSIS — Z79899 Other long term (current) drug therapy: Secondary | ICD-10-CM | POA: Insufficient documentation

## 2022-11-11 DIAGNOSIS — K264 Chronic or unspecified duodenal ulcer with hemorrhage: Secondary | ICD-10-CM | POA: Diagnosis not present

## 2022-11-11 DIAGNOSIS — K922 Gastrointestinal hemorrhage, unspecified: Secondary | ICD-10-CM | POA: Diagnosis present

## 2022-11-11 DIAGNOSIS — Z01812 Encounter for preprocedural laboratory examination: Secondary | ICD-10-CM

## 2022-11-11 HISTORY — DX: Cerebral infarction, unspecified: I63.9

## 2022-11-11 LAB — TYPE AND SCREEN
ABO/RH(D): B NEG
Antibody Screen: NEGATIVE

## 2022-11-11 LAB — POCT I-STAT, CHEM 8
BUN: 10 mg/dL (ref 8–23)
Calcium, Ion: 1.07 mmol/L — ABNORMAL LOW (ref 1.15–1.40)
Chloride: 101 mmol/L (ref 98–111)
Creatinine, Ser: 1 mg/dL (ref 0.44–1.00)
Glucose, Bld: 211 mg/dL — ABNORMAL HIGH (ref 70–99)
HCT: 28 % — ABNORMAL LOW (ref 36.0–46.0)
Hemoglobin: 9.5 g/dL — ABNORMAL LOW (ref 12.0–15.0)
Potassium: 3.4 mmol/L — ABNORMAL LOW (ref 3.5–5.1)
Sodium: 135 mmol/L (ref 135–145)
TCO2: 22 mmol/L (ref 22–32)

## 2022-11-11 SURGERY — ENDARTERECTOMY, CAROTID
Anesthesia: General | Laterality: Left

## 2022-11-11 MED ORDER — VANCOMYCIN HCL IN DEXTROSE 1-5 GM/200ML-% IV SOLN: 1000.0000 mg | INTRAVENOUS | Status: DC

## 2022-11-11 MED ORDER — MORPHINE SULFATE (PF) 2 MG/ML IV SOLN
INTRAVENOUS | Status: AC
  Administered 2022-11-11: 0.5 mg via INTRAVENOUS
  Filled 2022-11-11: qty 1

## 2022-11-11 MED ORDER — SODIUM CHLORIDE 0.9 % IV SOLN: INTRAVENOUS | Status: DC

## 2022-11-11 MED ORDER — CHLORHEXIDINE GLUCONATE 0.12 % MT SOLN: 15.0000 mL | Freq: Once | OROMUCOSAL | Status: AC

## 2022-11-11 MED ORDER — CHLORHEXIDINE GLUCONATE CLOTH 2 % EX PADS: 6.0000 | MEDICATED_PAD | Freq: Once | CUTANEOUS | Status: DC

## 2022-11-11 MED ORDER — CHLORHEXIDINE GLUCONATE 0.12 % MT SOLN
OROMUCOSAL | Status: AC
  Administered 2022-11-11: 15 mL via OROMUCOSAL
  Filled 2022-11-11: qty 15

## 2022-11-11 MED ORDER — MORPHINE SULFATE (PF) 2 MG/ML IV SOLN: 0.5000 mg | INTRAVENOUS | Status: DC | PRN

## 2022-11-11 MED ORDER — VANCOMYCIN HCL IN DEXTROSE 1-5 GM/200ML-% IV SOLN
INTRAVENOUS | Status: AC
  Filled 2022-11-11: qty 200

## 2022-11-11 MED ORDER — ORAL CARE MOUTH RINSE: 15.0000 mL | Freq: Once | OROMUCOSAL | Status: AC

## 2022-11-11 SURGICAL SUPPLY — 60 items
ADH SKN CLS APL DERMABOND .7 (GAUZE/BANDAGES/DRESSINGS) ×1
APL PRP STRL LF DISP 70% ISPRP (MISCELLANEOUS) ×1
BAG DECANTER FOR FLEXI CONT (MISCELLANEOUS) ×1 IMPLANT
BLADE SURG 15 STRL LF DISP TIS (BLADE) ×1 IMPLANT
BLADE SURG 15 STRL SS (BLADE) ×1
BLADE SURG SZ11 CARB STEEL (BLADE) ×1 IMPLANT
BOOT SUTURE AID YELLOW STND (SUTURE) ×1 IMPLANT
BRUSH SCRUB EZ  4% CHG (MISCELLANEOUS) ×1
BRUSH SCRUB EZ 4% CHG (MISCELLANEOUS) ×1 IMPLANT
CHLORAPREP W/TINT 26 (MISCELLANEOUS) ×1 IMPLANT
DERMABOND ADVANCED .7 DNX12 (GAUZE/BANDAGES/DRESSINGS) ×1 IMPLANT
DRAPE 3/4 80X56 (DRAPES) ×1 IMPLANT
DRAPE INCISE IOBAN 66X45 STRL (DRAPES) ×1 IMPLANT
DRAPE LAPAROTOMY 77X122 PED (DRAPES) ×1 IMPLANT
ELECT CAUTERY BLADE 6.4 (BLADE) ×1 IMPLANT
ELECT REM PT RETURN 9FT ADLT (ELECTROSURGICAL) ×1
ELECTRODE REM PT RTRN 9FT ADLT (ELECTROSURGICAL) ×1 IMPLANT
GAUZE 4X4 16PLY ~~LOC~~+RFID DBL (SPONGE) ×1 IMPLANT
GLOVE BIO SURGEON STRL SZ7 (GLOVE) ×3 IMPLANT
GOWN STRL REUS W/ TWL LRG LVL3 (GOWN DISPOSABLE) ×3 IMPLANT
GOWN STRL REUS W/ TWL XL LVL3 (GOWN DISPOSABLE) ×2 IMPLANT
GOWN STRL REUS W/TWL LRG LVL3 (GOWN DISPOSABLE) ×3
GOWN STRL REUS W/TWL XL LVL3 (GOWN DISPOSABLE) ×2
GRAFT VASC PATCH XENOSURE 1X14 (Vascular Products) ×1 IMPLANT
HEMOSTAT SURGICEL 2X3 (HEMOSTASIS) ×1 IMPLANT
IV NS 250ML (IV SOLUTION) ×1
IV NS 250ML BAXH (IV SOLUTION) ×1 IMPLANT
KIT TURNOVER KIT A (KITS) ×1 IMPLANT
LABEL OR SOLS (LABEL) ×1 IMPLANT
LOOP RED MAXI  1X406MM (MISCELLANEOUS) ×2
LOOP VESSEL MAXI 1X406 RED (MISCELLANEOUS) ×2 IMPLANT
LOOP VESSEL MINI 0.8X406 BLUE (MISCELLANEOUS) ×1 IMPLANT
LOOPS BLUE MINI 0.8X406MM (MISCELLANEOUS) ×1
MANIFOLD NEPTUNE II (INSTRUMENTS) ×1 IMPLANT
NEEDLE FILTER BLUNT 18X1 1/2 (NEEDLE) ×1 IMPLANT
NEEDLE HYPO 25X1 1.5 SAFETY (NEEDLE) ×1 IMPLANT
NS IRRIG 500ML POUR BTL (IV SOLUTION) ×1 IMPLANT
PACK BASIN MAJOR ARMC (MISCELLANEOUS) ×1 IMPLANT
SET WALTER ACTIVATION W/DRAPE (SET/KITS/TRAYS/PACK) ×1 IMPLANT
SHUNT W TPORT 9FR PRUITT F3 (SHUNT) ×1 IMPLANT
SPONGE T-LAP 18X18 ~~LOC~~+RFID (SPONGE) ×2 IMPLANT
SUT MNCRL 4-0 (SUTURE) ×1
SUT MNCRL 4-0 27XMFL (SUTURE) ×1
SUT PROLENE 6 0 BV (SUTURE) ×4 IMPLANT
SUT PROLENE 7 0 BV 1 (SUTURE) ×2 IMPLANT
SUT SILK 2 0 (SUTURE) ×1
SUT SILK 2-0 18XBRD TIE 12 (SUTURE) ×1 IMPLANT
SUT SILK 3 0 (SUTURE) ×1
SUT SILK 3-0 18XBRD TIE 12 (SUTURE) ×1 IMPLANT
SUT SILK 4 0 (SUTURE) ×1
SUT SILK 4-0 18XBRD TIE 12 (SUTURE) ×1 IMPLANT
SUT VIC AB 3-0 SH 27 (SUTURE) ×2
SUT VIC AB 3-0 SH 27X BRD (SUTURE) ×2 IMPLANT
SUTURE MNCRL 4-0 27XMF (SUTURE) ×1 IMPLANT
SYR 10ML LL (SYRINGE) ×2 IMPLANT
SYR 20ML LL LF (SYRINGE) ×1 IMPLANT
TRAP FLUID SMOKE EVACUATOR (MISCELLANEOUS) ×1 IMPLANT
TRAY FOLEY MTR SLVR 16FR STAT (SET/KITS/TRAYS/PACK) ×1 IMPLANT
TUBING CONNECTING 10 (TUBING) IMPLANT
WATER STERILE IRR 500ML POUR (IV SOLUTION) ×1 IMPLANT

## 2022-11-11 NOTE — Progress Notes (Signed)
Pt's surgery canceled per surgeon. Pt transported back to [facility] via [facility] transport.

## 2022-11-11 NOTE — Anesthesia Preprocedure Evaluation (Deleted)
Anesthesia Evaluation  Patient identified by MRN, date of birth, ID band Patient awake    Reviewed: Allergy & Precautions, NPO status , Patient's Chart, lab work & pertinent test results  History of Anesthesia Complications Negative for: history of anesthetic complications  Airway Mallampati: II  TM Distance: >3 FB Neck ROM: full    Dental  (+) Dental Advidsory Given, Edentulous Lower, Edentulous Upper   Pulmonary neg shortness of breath, neg sleep apnea, COPD,  COPD inhaler, former smoker   Pulmonary exam normal        Cardiovascular hypertension, (-) angina + CAD and + Past MI  (-) CABG Normal cardiovascular exam(-) dysrhythmias      Neuro/Psych CVA, Residual Symptoms  negative psych ROS   GI/Hepatic negative GI ROS, Neg liver ROS,,,  Endo/Other  negative endocrine ROSdiabetes    Renal/GU CRFRenal disease     Musculoskeletal   Abdominal   Peds  Hematology  (+) Blood dyscrasia, anemia   Anesthesia Other Findings Patient with PMH of CVA about 1.5 weeks ago per patient. States that her right side is very week and it hurts a lot to write with her right hand. Consented patient to possible post op ventilation and an arterial line.   Past Medical History: No date: Acute respiratory failure (HCC) No date: Anemia No date: Arthritis No date: CAD (coronary artery disease) No date: Chronic kidney disease No date: Constipation No date: COPD (chronic obstructive pulmonary disease) (HCC) No date: Diabetes mellitus without complication (HCC) No date: Gout No date: Hyperlipidemia No date: Hypertension No date: Muscle weakness No date: Pain No date: Restless legs syndrome (RLS) No date: Stroke [facility])  Past Surgical History: No date: ABDOMINAL HYSTERECTOMY No date: APPENDECTOMY 11/02/2022: CAROTID ANGIOGRAPHY; Bilateral     Comment:  Procedure: CAROTID ANGIOGRAPHY;  Surgeon: Annice Needy,               MD;  Location:  [facility] INVASIVE CV LAB;  Service:               Cardiovascular;  Laterality: Bilateral; 2019: CATARACT EXTRACTION; Bilateral 10/22/2022: COLONOSCOPY; N/A     Comment:  Procedure: COLONOSCOPY;  Surgeon: Midge Minium, MD;                Location: [facility] ENDOSCOPY;  Service: Endoscopy;                Laterality: N/A; 10/18/2022: ESOPHAGOGASTRODUODENOSCOPY (EGD) WITH PROPOFOL; N/A     Comment:  Procedure: ESOPHAGOGASTRODUODENOSCOPY (EGD) WITH               PROPOFOL;  Surgeon: Toney Reil, MD;  Location:               [facility] ENDOSCOPY;  Service: Gastroenterology;  Laterality:               N/A; 10/22/2022: ESOPHAGOGASTRODUODENOSCOPY (EGD) WITH PROPOFOL; N/A     Comment:  Procedure: ESOPHAGOGASTRODUODENOSCOPY (EGD) WITH               PROPOFOL;  Surgeon: Midge Minium, MD;  Location: [facility]               ENDOSCOPY;  Service: Endoscopy;  Laterality: N/A; 11/04/2022: ESOPHAGOGASTRODUODENOSCOPY (EGD) WITH PROPOFOL; N/A     Comment:  Procedure: ESOPHAGOGASTRODUODENOSCOPY (EGD) WITH               PROPOFOL;  Surgeon: Toney Reil, MD;  Location:  [facility] ENDOSCOPY;  Service: Gastroenterology;  Laterality:               N/A; No date: EYE SURGERY No date: TONSILLECTOMY  BMI    Body Mass Index: 24.87 kg/m      Reproductive/Obstetrics negative OB ROS                             Anesthesia Physical Anesthesia Plan  ASA: 3  Anesthesia Plan: General ETT   Post-op Pain Management:    Induction: Intravenous  PONV Risk Score and Plan: 3 and Ondansetron, Dexamethasone, Midazolam and Treatment may vary due to age or medical condition  Airway Management Planned: Oral ETT  Additional Equipment: Arterial line  Intra-op Plan:   Post-operative Plan: Extubation in OR and Possible Post-op intubation/ventilation  Informed Consent: I have reviewed the patients History and Physical, chart, labs and discussed the procedure including the risks, benefits and  alternatives for the proposed anesthesia with the patient or authorized representative who has indicated his/her understanding and acceptance.     Dental Advisory Given  Plan Discussed with: Anesthesiologist, CRNA and Surgeon  Anesthesia Plan Comments: (Patient consented for risks of anesthesia including but not limited to:  - adverse reactions to medications - damage to eyes, teeth, lips or other oral mucosa - nerve damage due to positioning  - sore throat or hoarseness - Damage to heart, brain, nerves, lungs, other parts of body or loss of life  Patient voiced understanding.)       Anesthesia Quick Evaluation

## 2022-11-16 ENCOUNTER — Inpatient Hospital Stay
Admission: RE | Admit: 2022-11-16 | Discharge: 2022-11-17 | DRG: 039 | Disposition: A | Discharge status: Skilled Nursing Facility | Payer: Medicare Other | Source: Skilled Nursing Facility | Attending: Vascular Surgery | Admitting: Vascular Surgery

## 2022-11-16 ENCOUNTER — Inpatient Hospital Stay: Payer: Medicare Other | Admitting: Anesthesiology

## 2022-11-16 ENCOUNTER — Other Ambulatory Visit: Payer: Self-pay

## 2022-11-16 ENCOUNTER — Encounter: Payer: Self-pay | Admitting: Vascular Surgery

## 2022-11-16 ENCOUNTER — Encounter
Admission: RE | Disposition: A | Discharge status: Skilled Nursing Facility | Payer: Self-pay | Source: Skilled Nursing Facility | Attending: Vascular Surgery

## 2022-11-16 DIAGNOSIS — I6522 Occlusion and stenosis of left carotid artery: Secondary | ICD-10-CM | POA: Diagnosis present

## 2022-11-16 DIAGNOSIS — I10 Essential (primary) hypertension: Secondary | ICD-10-CM | POA: Diagnosis present

## 2022-11-16 DIAGNOSIS — K5900 Constipation, unspecified: Secondary | ICD-10-CM | POA: Diagnosis present

## 2022-11-16 DIAGNOSIS — G8929 Other chronic pain: Secondary | ICD-10-CM | POA: Diagnosis present

## 2022-11-16 DIAGNOSIS — I2510 Atherosclerotic heart disease of native coronary artery without angina pectoris: Secondary | ICD-10-CM | POA: Diagnosis present

## 2022-11-16 DIAGNOSIS — G2581 Restless legs syndrome: Secondary | ICD-10-CM | POA: Diagnosis present

## 2022-11-16 DIAGNOSIS — M109 Gout, unspecified: Secondary | ICD-10-CM | POA: Diagnosis present

## 2022-11-16 DIAGNOSIS — E785 Hyperlipidemia, unspecified: Secondary | ICD-10-CM | POA: Diagnosis present

## 2022-11-16 DIAGNOSIS — I63232 Cerebral infarction due to unspecified occlusion or stenosis of left carotid arteries: Secondary | ICD-10-CM

## 2022-11-16 DIAGNOSIS — J449 Chronic obstructive pulmonary disease, unspecified: Secondary | ICD-10-CM | POA: Diagnosis present

## 2022-11-16 DIAGNOSIS — I63239 Cerebral infarction due to unspecified occlusion or stenosis of unspecified carotid arteries: Principal | ICD-10-CM | POA: Diagnosis present

## 2022-11-16 DIAGNOSIS — M1990 Unspecified osteoarthritis, unspecified site: Secondary | ICD-10-CM | POA: Diagnosis present

## 2022-11-16 DIAGNOSIS — E119 Type 2 diabetes mellitus without complications: Secondary | ICD-10-CM | POA: Diagnosis present

## 2022-11-16 DIAGNOSIS — Z8673 Personal history of transient ischemic attack (TIA), and cerebral infarction without residual deficits: Secondary | ICD-10-CM | POA: Diagnosis not present

## 2022-11-16 DIAGNOSIS — M549 Dorsalgia, unspecified: Secondary | ICD-10-CM | POA: Diagnosis present

## 2022-11-16 HISTORY — PX: ENDARTERECTOMY: SHX5162

## 2022-11-16 LAB — TYPE AND SCREEN
ABO/RH(D): B NEG
Antibody Screen: NEGATIVE

## 2022-11-16 LAB — GLUCOSE, CAPILLARY
Glucose-Capillary: 200 mg/dL — ABNORMAL HIGH (ref 70–99)
Glucose-Capillary: 177 mg/dL — ABNORMAL HIGH (ref 70–99)
Glucose-Capillary: 144 mg/dL — ABNORMAL HIGH (ref 70–99)
Glucose-Capillary: 123 mg/dL — ABNORMAL HIGH (ref 70–99)

## 2022-11-16 LAB — MRSA NEXT GEN BY PCR, NASAL: MRSA by PCR Next Gen: NOT DETECTED

## 2022-11-16 SURGERY — ENDARTERECTOMY, CAROTID
Anesthesia: General | Laterality: Left

## 2022-11-16 MED ORDER — MAGNESIUM SULFATE 2 GM/50ML IV SOLN: 2.0000 g | Freq: Every day | INTRAVENOUS | Status: DC | PRN

## 2022-11-16 MED ORDER — LIDOCAINE HCL (PF) 2 % IJ SOLN
INTRAMUSCULAR | Status: AC
  Filled 2022-11-16: qty 5

## 2022-11-16 MED ORDER — POTASSIUM CHLORIDE CRYS ER 20 MEQ PO TBCR
20.0000 meq | EXTENDED_RELEASE_TABLET | Freq: Every day | ORAL | Status: DC
  Administered 2022-11-16 – 2022-11-17 (×2): 20 meq via ORAL
  Filled 2022-11-16 (×2): qty 1

## 2022-11-16 MED ORDER — FUROSEMIDE 20 MG PO TABS
20.0000 mg | ORAL_TABLET | Freq: Every day | ORAL | Status: DC
  Administered 2022-11-17: 20 mg via ORAL
  Filled 2022-11-16: qty 1

## 2022-11-16 MED ORDER — AMLODIPINE BESYLATE 5 MG PO TABS
5.0000 mg | ORAL_TABLET | Freq: Every day | ORAL | Status: DC
  Administered 2022-11-17: 5 mg via ORAL
  Filled 2022-11-16: qty 1

## 2022-11-16 MED ORDER — LABETALOL HCL 5 MG/ML IV SOLN: 10.0000 mg | INTRAVENOUS | Status: DC | PRN

## 2022-11-16 MED ORDER — INSULIN ASPART 100 UNIT/ML IJ SOLN
INTRAMUSCULAR | Status: AC
  Filled 2022-11-16: qty 1

## 2022-11-16 MED ORDER — SODIUM CHLORIDE 0.9 % IV SOLN: INTRAVENOUS | Status: DC

## 2022-11-16 MED ORDER — ONDANSETRON HCL 4 MG/2ML IJ SOLN: 4.0000 mg | Freq: Four times a day (QID) | INTRAMUSCULAR | Status: DC | PRN

## 2022-11-16 MED ORDER — HEPARIN SODIUM (PORCINE) 1000 UNIT/ML IJ SOLN
INTRAMUSCULAR | Status: AC
  Filled 2022-11-16: qty 10

## 2022-11-16 MED ORDER — VITAMIN D 25 MCG (1000 UNIT) PO TABS
2000.0000 [IU] | ORAL_TABLET | Freq: Every day | ORAL | Status: DC
  Administered 2022-11-17: 2000 [IU] via ORAL
  Filled 2022-11-16: qty 2

## 2022-11-16 MED ORDER — VANCOMYCIN HCL IN DEXTROSE 1-5 GM/200ML-% IV SOLN
INTRAVENOUS | Status: AC
  Filled 2022-11-16: qty 200

## 2022-11-16 MED ORDER — FOLIC ACID 1 MG PO TABS
1000.0000 ug | ORAL_TABLET | Freq: Every day | ORAL | Status: DC
  Administered 2022-11-17: 1 mg via ORAL
  Filled 2022-11-16: qty 1

## 2022-11-16 MED ORDER — ACETAMINOPHEN 650 MG RE SUPP: 325.0000 mg | RECTAL | Status: DC | PRN

## 2022-11-16 MED ORDER — ADULT MULTIVITAMIN W/MINERALS CH
1.0000 | ORAL_TABLET | Freq: Every day | ORAL | Status: DC
  Administered 2022-11-17: 1 via ORAL
  Filled 2022-11-16: qty 1

## 2022-11-16 MED ORDER — ROCURONIUM BROMIDE 100 MG/10ML IV SOLN
INTRAVENOUS | Status: DC | PRN
  Administered 2022-11-16: 40 mg via INTRAVENOUS
  Administered 2022-11-16: 10 mg via INTRAVENOUS

## 2022-11-16 MED ORDER — SODIUM CHLORIDE 0.9 % IV SOLN: 500.0000 mL | Freq: Once | INTRAVENOUS | Status: DC | PRN

## 2022-11-16 MED ORDER — MOMETASONE FURO-FORMOTEROL FUM 200-5 MCG/ACT IN AERO
2.0000 | INHALATION_SPRAY | Freq: Two times a day (BID) | RESPIRATORY_TRACT | Status: DC
  Administered 2022-11-16 – 2022-11-17 (×2): 2 via RESPIRATORY_TRACT
  Filled 2022-11-16: qty 8.8

## 2022-11-16 MED ORDER — VANCOMYCIN HCL IN DEXTROSE 1-5 GM/200ML-% IV SOLN
1000.0000 mg | Freq: Once | INTRAVENOUS | Status: AC
  Administered 2022-11-16 (×2): 1000 mg via INTRAVENOUS

## 2022-11-16 MED ORDER — NITROGLYCERIN IN D5W 200-5 MCG/ML-% IV SOLN: 5.0000 ug/min | INTRAVENOUS | Status: DC

## 2022-11-16 MED ORDER — LIDOCAINE HCL (PF) 1 % IJ SOLN
INTRAMUSCULAR | Status: AC
  Filled 2022-11-16: qty 30

## 2022-11-16 MED ORDER — FENTANYL CITRATE (PF) 100 MCG/2ML IJ SOLN: 25.0000 ug | INTRAMUSCULAR | Status: DC | PRN

## 2022-11-16 MED ORDER — FAMOTIDINE IN NACL 20-0.9 MG/50ML-% IV SOLN
20.0000 mg | Freq: Two times a day (BID) | INTRAVENOUS | Status: DC
  Administered 2022-11-16 – 2022-11-17 (×2): 20 mg via INTRAVENOUS
  Filled 2022-11-16 (×3): qty 50

## 2022-11-16 MED ORDER — HYDRALAZINE HCL 20 MG/ML IJ SOLN: 5.0000 mg | INTRAMUSCULAR | Status: DC | PRN

## 2022-11-16 MED ORDER — LIDOCAINE HCL (CARDIAC) PF 100 MG/5ML IV SOSY
PREFILLED_SYRINGE | INTRAVENOUS | Status: DC | PRN
  Administered 2022-11-16: 80 mg via INTRAVENOUS

## 2022-11-16 MED ORDER — CHLORHEXIDINE GLUCONATE 0.12 % MT SOLN
OROMUCOSAL | Status: AC
  Administered 2022-11-16: 15 mL via OROMUCOSAL
  Filled 2022-11-16: qty 15

## 2022-11-16 MED ORDER — DOCUSATE SODIUM 100 MG PO CAPS
100.0000 mg | ORAL_CAPSULE | Freq: Every day | ORAL | Status: DC
  Administered 2022-11-17: 100 mg via ORAL
  Filled 2022-11-16: qty 1

## 2022-11-16 MED ORDER — FIBRIN SEALANT 2 ML SINGLE DOSE KIT
PACK | CUTANEOUS | Status: AC
  Filled 2022-11-16: qty 2

## 2022-11-16 MED ORDER — ACETAMINOPHEN 10 MG/ML IV SOLN
INTRAVENOUS | Status: AC
  Filled 2022-11-16: qty 100

## 2022-11-16 MED ORDER — INSULIN ASPART 100 UNIT/ML IJ SOLN
5.0000 [IU] | Freq: Once | INTRAMUSCULAR | Status: AC
  Administered 2022-11-16: 5 [IU] via SUBCUTANEOUS

## 2022-11-16 MED ORDER — SENNA 8.6 MG PO TABS
1.0000 | ORAL_TABLET | Freq: Two times a day (BID) | ORAL | Status: DC
  Administered 2022-11-16: 8.6 mg via ORAL
  Filled 2022-11-16 (×2): qty 1

## 2022-11-16 MED ORDER — ONDANSETRON HCL 4 MG/2ML IJ SOLN
INTRAMUSCULAR | Status: DC | PRN
  Administered 2022-11-16: 4 mg via INTRAVENOUS

## 2022-11-16 MED ORDER — FENTANYL CITRATE (PF) 100 MCG/2ML IJ SOLN
INTRAMUSCULAR | Status: DC | PRN
  Administered 2022-11-16 (×2): 50 ug via INTRAVENOUS

## 2022-11-16 MED ORDER — HEMOSTATIC AGENTS (NO CHARGE) OPTIME
TOPICAL | Status: DC | PRN
  Administered 2022-11-16: 1 via TOPICAL

## 2022-11-16 MED ORDER — LIDOCAINE HCL 1 % IJ SOLN
INTRAMUSCULAR | Status: DC | PRN
  Administered 2022-11-16: 10 mL

## 2022-11-16 MED ORDER — CHLORHEXIDINE GLUCONATE CLOTH 2 % EX PADS
6.0000 | MEDICATED_PAD | Freq: Every day | CUTANEOUS | Status: DC
  Administered 2022-11-16 – 2022-11-17 (×2): 6 via TOPICAL

## 2022-11-16 MED ORDER — FIBRIN SEALANT 2 ML SINGLE DOSE KIT
PACK | CUTANEOUS | Status: DC | PRN
  Administered 2022-11-16: 2 mL via TOPICAL

## 2022-11-16 MED ORDER — ALBUTEROL SULFATE (2.5 MG/3ML) 0.083% IN NEBU: 3.0000 mL | INHALATION_SOLUTION | RESPIRATORY_TRACT | Status: DC | PRN

## 2022-11-16 MED ORDER — SODIUM CHLORIDE 0.9 % IV SOLN
INTRAVENOUS | Status: DC | PRN
  Administered 2022-11-16: 45 mL via INTRAMUSCULAR

## 2022-11-16 MED ORDER — ACETAMINOPHEN 10 MG/ML IV SOLN: 1000.0000 mg | Freq: Once | INTRAVENOUS | Status: DC | PRN

## 2022-11-16 MED ORDER — CHLORHEXIDINE GLUCONATE 0.12 % MT SOLN: 15.0000 mL | Freq: Once | OROMUCOSAL | Status: AC

## 2022-11-16 MED ORDER — ATORVASTATIN CALCIUM 20 MG PO TABS
40.0000 mg | ORAL_TABLET | Freq: Every day | ORAL | Status: DC
  Administered 2022-11-16: 40 mg via ORAL
  Filled 2022-11-16: qty 2

## 2022-11-16 MED ORDER — PROPOFOL 10 MG/ML IV BOLUS
INTRAVENOUS | Status: DC | PRN
  Administered 2022-11-16: 70 mg via INTRAVENOUS

## 2022-11-16 MED ORDER — PHENOL 1.4 % MT LIQD
1.0000 | OROMUCOSAL | Status: DC | PRN
  Filled 2022-11-16: qty 177

## 2022-11-16 MED ORDER — ACETAMINOPHEN 10 MG/ML IV SOLN
INTRAVENOUS | Status: DC | PRN
  Administered 2022-11-16: 1000 mg via INTRAVENOUS

## 2022-11-16 MED ORDER — VANCOMYCIN HCL IN DEXTROSE 1-5 GM/200ML-% IV SOLN
1000.0000 mg | Freq: Two times a day (BID) | INTRAVENOUS | Status: AC
  Administered 2022-11-16 – 2022-11-17 (×2): 1000 mg via INTRAVENOUS
  Filled 2022-11-16 (×3): qty 200

## 2022-11-16 MED ORDER — OXYCODONE HCL 5 MG PO TABS: 5.0000 mg | ORAL_TABLET | Freq: Once | ORAL | Status: DC | PRN

## 2022-11-16 MED ORDER — ALUM & MAG HYDROXIDE-SIMETH 200-200-20 MG/5ML PO SUSP: 15.0000 mL | ORAL | Status: DC | PRN

## 2022-11-16 MED ORDER — PANTOPRAZOLE SODIUM 40 MG PO TBEC
40.0000 mg | DELAYED_RELEASE_TABLET | Freq: Two times a day (BID) | ORAL | Status: DC
  Administered 2022-11-16 – 2022-11-17 (×2): 40 mg via ORAL
  Filled 2022-11-16 (×2): qty 1

## 2022-11-16 MED ORDER — VITAMIN B-12 100 MCG PO TABS
500.0000 ug | ORAL_TABLET | Freq: Every day | ORAL | Status: DC
  Administered 2022-11-17: 500 ug via ORAL
  Filled 2022-11-16: qty 5

## 2022-11-16 MED ORDER — METOPROLOL TARTRATE 5 MG/5ML IV SOLN: 2.0000 mg | INTRAVENOUS | Status: DC | PRN

## 2022-11-16 MED ORDER — GUAIFENESIN-DM 100-10 MG/5ML PO SYRP
15.0000 mL | ORAL_SOLUTION | ORAL | Status: DC | PRN
  Administered 2022-11-16: 15 mL via ORAL
  Filled 2022-11-16 (×2): qty 15

## 2022-11-16 MED ORDER — PROPOFOL 10 MG/ML IV BOLUS
INTRAVENOUS | Status: AC
  Filled 2022-11-16: qty 40

## 2022-11-16 MED ORDER — PHENYLEPHRINE 80 MCG/ML (10ML) SYRINGE FOR IV PUSH (FOR BLOOD PRESSURE SUPPORT)
PREFILLED_SYRINGE | INTRAVENOUS | Status: DC | PRN
  Administered 2022-11-16: 80 ug via INTRAVENOUS

## 2022-11-16 MED ORDER — NITROGLYCERIN IN D5W 200-5 MCG/ML-% IV SOLN
INTRAVENOUS | Status: AC
  Filled 2022-11-16: qty 250

## 2022-11-16 MED ORDER — CARVEDILOL 12.5 MG PO TABS
25.0000 mg | ORAL_TABLET | Freq: Two times a day (BID) | ORAL | Status: DC
  Administered 2022-11-16 – 2022-11-17 (×2): 25 mg via ORAL
  Filled 2022-11-16 (×2): qty 2

## 2022-11-16 MED ORDER — 0.9 % SODIUM CHLORIDE (POUR BTL) OPTIME
TOPICAL | Status: DC | PRN
  Administered 2022-11-16: 500 mL

## 2022-11-16 MED ORDER — GABAPENTIN 100 MG PO CAPS
100.0000 mg | ORAL_CAPSULE | Freq: Every day | ORAL | Status: DC
  Administered 2022-11-16: 100 mg via ORAL
  Filled 2022-11-16: qty 1

## 2022-11-16 MED ORDER — ONDANSETRON HCL 4 MG/2ML IJ SOLN
INTRAMUSCULAR | Status: AC
  Filled 2022-11-16: qty 2

## 2022-11-16 MED ORDER — POTASSIUM CHLORIDE CRYS ER 20 MEQ PO TBCR: 20.0000 meq | EXTENDED_RELEASE_TABLET | Freq: Every day | ORAL | Status: DC | PRN

## 2022-11-16 MED ORDER — OXYCODONE-ACETAMINOPHEN 5-325 MG PO TABS
1.0000 | ORAL_TABLET | Freq: Four times a day (QID) | ORAL | Status: DC | PRN
  Administered 2022-11-16 – 2022-11-17 (×2): 1 via ORAL
  Administered 2022-11-17: 2 via ORAL
  Filled 2022-11-16 (×2): qty 1
  Filled 2022-11-16: qty 2

## 2022-11-16 MED ORDER — OXYCODONE HCL 5 MG/5ML PO SOLN: 5.0000 mg | Freq: Once | ORAL | Status: DC | PRN

## 2022-11-16 MED ORDER — ORAL CARE MOUTH RINSE: 15.0000 mL | Freq: Once | OROMUCOSAL | Status: AC

## 2022-11-16 MED ORDER — HEPARIN SODIUM (PORCINE) 1000 UNIT/ML IJ SOLN
INTRAMUSCULAR | Status: DC | PRN
  Administered 2022-11-16: 6000 [IU] via INTRAVENOUS

## 2022-11-16 MED ORDER — FENTANYL CITRATE (PF) 100 MCG/2ML IJ SOLN
INTRAMUSCULAR | Status: AC
  Filled 2022-11-16: qty 2

## 2022-11-16 MED ORDER — ASPIRIN 81 MG PO TBEC
81.0000 mg | DELAYED_RELEASE_TABLET | Freq: Every day | ORAL | Status: DC
  Administered 2022-11-16 – 2022-11-17 (×2): 81 mg via ORAL
  Filled 2022-11-16 (×2): qty 1

## 2022-11-16 MED ORDER — ACETAMINOPHEN 325 MG PO TABS
325.0000 mg | ORAL_TABLET | ORAL | Status: DC | PRN
  Administered 2022-11-16: 650 mg via ORAL
  Filled 2022-11-16: qty 2

## 2022-11-16 MED ORDER — PHENYLEPHRINE 80 MCG/ML (10ML) SYRINGE FOR IV PUSH (FOR BLOOD PRESSURE SUPPORT)
PREFILLED_SYRINGE | INTRAVENOUS | Status: AC
  Filled 2022-11-16: qty 10

## 2022-11-16 MED ORDER — PHENYLEPHRINE HCL-NACL 20-0.9 MG/250ML-% IV SOLN
INTRAVENOUS | Status: DC | PRN
  Administered 2022-11-16: 20 ug/min via INTRAVENOUS

## 2022-11-16 MED ORDER — MORPHINE SULFATE (PF) 2 MG/ML IV SOLN: 2.0000 mg | INTRAVENOUS | Status: DC | PRN

## 2022-11-16 MED ORDER — ONDANSETRON HCL 4 MG/2ML IJ SOLN: 4.0000 mg | Freq: Once | INTRAMUSCULAR | Status: DC | PRN

## 2022-11-16 MED ORDER — SUGAMMADEX SODIUM 200 MG/2ML IV SOLN
INTRAVENOUS | Status: DC | PRN
  Administered 2022-11-16: 200 mg via INTRAVENOUS

## 2022-11-16 MED ORDER — LABETALOL HCL 5 MG/ML IV SOLN
INTRAVENOUS | Status: AC
  Filled 2022-11-16: qty 4

## 2022-11-16 MED ORDER — NOREPINEPHRINE 4 MG/250ML-% IV SOLN
INTRAVENOUS | Status: AC
  Filled 2022-11-16: qty 250

## 2022-11-16 MED ORDER — LABETALOL HCL 5 MG/ML IV SOLN
INTRAVENOUS | Status: DC | PRN
  Administered 2022-11-16: 10 mg via INTRAVENOUS

## 2022-11-16 SURGICAL SUPPLY — 60 items
BAG DECANTER FOR FLEXI CONT (MISCELLANEOUS) ×1 IMPLANT
BLADE SURG 15 STRL LF DISP TIS (BLADE) ×1 IMPLANT
BLADE SURG 15 STRL SS (BLADE) ×1
BLADE SURG SZ11 CARB STEEL (BLADE) ×1 IMPLANT
BOOT SUTURE AID YELLOW STND (SUTURE) ×1 IMPLANT
BRUSH SCRUB EZ  4% CHG (MISCELLANEOUS) ×1
BRUSH SCRUB EZ 4% CHG (MISCELLANEOUS) ×1 IMPLANT
CHLORAPREP W/TINT 26 (MISCELLANEOUS) ×1 IMPLANT
DERMABOND ADVANCED .7 DNX12 (GAUZE/BANDAGES/DRESSINGS) ×1 IMPLANT
DRAPE 3/4 80X56 (DRAPES) ×1 IMPLANT
DRAPE INCISE IOBAN 66X45 STRL (DRAPES) ×1 IMPLANT
DRAPE LAPAROTOMY 77X122 PED (DRAPES) ×1 IMPLANT
ELECT CAUTERY BLADE 6.4 (BLADE) ×1 IMPLANT
ELECT REM PT RETURN 9FT ADLT (ELECTROSURGICAL) ×1
ELECTRODE REM PT RTRN 9FT ADLT (ELECTROSURGICAL) ×1 IMPLANT
GAUZE 4X4 16PLY ~~LOC~~+RFID DBL (SPONGE) ×1 IMPLANT
GLOVE BIO SURGEON STRL SZ7 (GLOVE) ×3 IMPLANT
GOWN STRL REUS W/ TWL LRG LVL3 (GOWN DISPOSABLE) ×3 IMPLANT
GOWN STRL REUS W/ TWL XL LVL3 (GOWN DISPOSABLE) ×2 IMPLANT
GOWN STRL REUS W/TWL LRG LVL3 (GOWN DISPOSABLE) ×2
GOWN STRL REUS W/TWL XL LVL3 (GOWN DISPOSABLE) ×1
GRAFT VASC PATCH XENOSURE 1X14 (Vascular Products) ×1 IMPLANT
HEMOSTAT SURGICEL 2X3 (HEMOSTASIS) ×1 IMPLANT
IV NS 250ML (IV SOLUTION) ×1
IV NS 250ML BAXH (IV SOLUTION) ×1 IMPLANT
KIT TURNOVER KIT A (KITS) ×1 IMPLANT
LABEL OR SOLS (LABEL) ×1 IMPLANT
LOOP RED MAXI  1X406MM (MISCELLANEOUS) ×2
LOOP VESSEL MAXI 1X406 RED (MISCELLANEOUS) ×2 IMPLANT
LOOP VESSEL MINI 0.8X406 BLUE (MISCELLANEOUS) ×1 IMPLANT
LOOPS BLUE MINI 0.8X406MM (MISCELLANEOUS) ×1
MANIFOLD NEPTUNE II (INSTRUMENTS) ×1 IMPLANT
NDL FILTER BLUNT 18X1 1/2 (NEEDLE) ×1 IMPLANT
NDL HYPO 25X1 1.5 SAFETY (NEEDLE) ×1 IMPLANT
NEEDLE FILTER BLUNT 18X1 1/2 (NEEDLE) IMPLANT
NEEDLE HYPO 25X1 1.5 SAFETY (NEEDLE) ×1 IMPLANT
NS IRRIG 500ML POUR BTL (IV SOLUTION) ×1 IMPLANT
PACK BASIN MAJOR ARMC (MISCELLANEOUS) ×1 IMPLANT
SET WALTER ACTIVATION W/DRAPE (SET/KITS/TRAYS/PACK) ×1 IMPLANT
SHUNT W TPORT 9FR PRUITT F3 (SHUNT) ×1 IMPLANT
SPONGE T-LAP 18X18 ~~LOC~~+RFID (SPONGE) ×2 IMPLANT
SUT MNCRL 4-0 (SUTURE) ×1
SUT MNCRL 4-0 27XMFL (SUTURE) ×1
SUT PROLENE 6 0 BV (SUTURE) ×4 IMPLANT
SUT PROLENE 7 0 BV 1 (SUTURE) ×2 IMPLANT
SUT SILK 2 0 (SUTURE) ×1
SUT SILK 2-0 18XBRD TIE 12 (SUTURE) ×1 IMPLANT
SUT SILK 3 0 (SUTURE) ×1
SUT SILK 3-0 18XBRD TIE 12 (SUTURE) ×1 IMPLANT
SUT SILK 4 0 (SUTURE) ×1
SUT SILK 4-0 18XBRD TIE 12 (SUTURE) ×1 IMPLANT
SUT VIC AB 3-0 SH 27 (SUTURE) ×2
SUT VIC AB 3-0 SH 27X BRD (SUTURE) ×2 IMPLANT
SUTURE MNCRL 4-0 27XMF (SUTURE) ×1 IMPLANT
SYR 10ML LL (SYRINGE) ×2 IMPLANT
SYR 20ML LL LF (SYRINGE) ×1 IMPLANT
TRAP FLUID SMOKE EVACUATOR (MISCELLANEOUS) ×1 IMPLANT
TRAY FOLEY MTR SLVR 16FR STAT (SET/KITS/TRAYS/PACK) ×1 IMPLANT
TUBING CONNECTING 10 (TUBING) IMPLANT
WATER STERILE IRR 500ML POUR (IV SOLUTION) ×1 IMPLANT

## 2022-11-16 NOTE — Anesthesia Procedure Notes (Signed)
Arterial Line Insertion Start/End12/03/2022 10:25 AM, 11/16/2022 10:35 AM Performed by: Corinda Gubler, MD, Katherine Basset, CRNA, CRNA  Patient location: OR. Preanesthetic checklist: patient identified, IV checked, site marked, risks and benefits discussed, surgical consent, monitors and equipment checked, pre-op evaluation, timeout performed and anesthesia consent Lidocaine 1% used for infiltration and patient sedated Right, radial was placed Catheter size: 22 G Hand hygiene performed , maximum sterile barriers used  and Seldinger technique used  Attempts: 2 Procedure performed without using ultrasound guided technique. Following insertion, Biopatch and dressing applied. Post procedure assessment: normal  Patient tolerated the procedure well with no immediate complications.

## 2022-11-16 NOTE — Progress Notes (Signed)
Confirmed with Dr. Suzan Slick patient did not need a new Chem 8 recheck for K+ since last result was 3.4 on 11/29.

## 2022-11-16 NOTE — Interval H&P Note (Signed)
History and Physical Interval Note:  11/16/2022 9:45 AM  Kelly Rowland  has presented today for surgery, with the diagnosis of CAROTID STENOSIS  SYMPTOMATIC WITH INFARCTION.  The various methods of treatment have been discussed with the patient and family. After consideration of risks, benefits and other options for treatment, the patient has consented to  Procedure(s): ENDARTERECTOMY CAROTID (Left) as a surgical intervention.  The patient's history has been reviewed, patient examined, no change in status, stable for surgery.  I have reviewed the patient's chart and labs.  Questions were answered to the patient's satisfaction.     Festus Barren

## 2022-11-16 NOTE — Op Note (Signed)
[facility]   OPERATIVE NOTE  PROCEDURE:   1.  Left carotid endarterectomy with bovine pericardial patch reconstruction  PRE-OPERATIVE DIAGNOSIS: 1.  High grade Left carotid stenosis 2.  Recent left hemispheric stroke  POST-OPERATIVE DIAGNOSIS: same as above   SURGEON: Festus Barren, MD  ASSISTANT(S): Rolla Plate, NP  ANESTHESIA: general  ESTIMATED BLOOD LOSS: 25 cc  FINDING(S): 1.  left carotid plaque.  SPECIMEN(S):  Carotid plaque (sent to Pathology)  INDICATIONS:   Kelly Rowland is a 81 y.o. female who presents with a recent stroke and left carotid stenosis of greater than 90%.  I discussed with the patient the risks, benefits, and alternatives to carotid endarterectomy.  I discussed the differences between carotid stenting and carotid endarterectomy. I discussed the procedural details of carotid endarterectomy with the patient.  The patient is aware that the risks of carotid endarterectomy include but are not limited to: bleeding, infection, stroke, myocardial infarction, death, cranial nerve injuries both temporary and permanent, neck hematoma, possible airway compromise, labile blood pressure post-operatively, cerebral hyperperfusion syndrome, and possible need for additional interventions in the future. The patient is aware of the risks and agrees to proceed forward with the procedure. An assistant was present during the procedure to help facilitate the exposure and expedite the procedure.   DESCRIPTION: After full informed written consent was obtained from the patient, the patient was brought back to the operating room and placed supine upon the operating table.  Prior to induction, the patient received IV antibiotics.  After obtaining adequate anesthesia, the patient was placed into a modified beach chair position with a shoulder roll in place and the patient's neck slightly hyperextended and rotated away from the surgical site.  The patient was prepped in the  standard fashion for a carotid endarterectomy. The assistant provided retraction and mobilization to help facilitate exposure and expedite the procedure throughout the entire procedure.  This included following suture, using retractors, and optimizing lighting.  I made an incision anterior to the sternocleidomastoid muscle and dissected down through the subcutaneous tissue.  The platysmas was opened with electrocautery.  Then I dissected down to the internal jugular vein and facial vein.  The facial vein is ligated and divided between 2-0 silk ties.  This was dissected posteriorly until I obtained visualization of the common carotid artery.  This was dissected out and then a vessel loop was placed around the common carotid artery.  I then dissected in a periadventitial fashion along the common carotid artery up to the bifurcation.  I then identified the external carotid artery and the superior thyroid artery.  I placed a vessel loop around the superior thyroid artery, and I also dissected out the external carotid artery and placed a vessel loop around it. In the process of this dissection, the hypoglossal nerve was identified and protected from harm.  I then dissected out the internal carotid artery until I identified an area in the internal carotid artery clearly above the stenosis.  I dissected slightly distal to this area, and placed a vessel loop around the artery.  At this point, we gave the patient 6000 units of intravenous heparin.  After this was allowed to circulate for several minutes, I pulled up control on the vessel loops to clamp the internal carotid artery, external carotid artery, superior thyroid artery, and then the common carotid artery.  I then made an arteriotomy in the common carotid artery with a 11 blade, and extended the arteriotomy with a Potts scissor  down into the common carotid artery, then I carried the arteriotomy through the bifurcation into the internal carotid artery until I reached  an area that was not diseased.  At this point, I took the [place] shunt that previously been prepared and I inserted it into the internal carotid artery first, and then into the common carotid artery taking care to flush and de-air prior to release of control. At this point, I started the endarterectomy in the common carotid artery with a [place] elevator and carried this dissection down into the common carotid artery circumferentially.  Then I transected the plaque at a segment where it was adherent and transected the plaque with Potts scissors.  I then carried this dissection up into the external carotid artery.  The plaque was extracted by unclamping the external carotid artery and performing an eversion endarterectomy.  The dissection was then carried into the internal carotid artery where a nice feathered end point was created with gentle traction.  I passed the plaque off the field as a specimen. At this point I removed all loose flecks and remaining disease possible.  At this point, I was satisfied that the minimal remaining disease was densely adherent to the wall and wall integrity was intact.  A small rent in the lateral arterial wall was repaired with a pair of 7-0 Prolene sutures.  I then fashioned a Bovine pericardial patch for the artery and sewed it in place with two running stitch of 6-0 Prolene.  I started at the distal endpoint and ran one half the length of the arteriotomy.  I then cut and beveled the patch to an appropriate length to match the arteriotomy.  I started the second 6-0 Prolene at the proximal end point.  The medial suture line was completed and the lateral suture line was run approximately one quarter the length of the arteriotomy.  Prior to completing this patch angioplasty, I removed the shunt first from the internal carotid artery, from which there was excellent backbleeding, and clamped it.  Then I removed the shunt from the common carotid artery, from which there was  excellent antegrade bleeding, and then clamped it.  At this point, I allowed the external carotid artery to backbleed, which was excellent.  Then I instilled heparinized saline in this patched artery and then completed the patch angioplasty in the usual fashion.  First, I released the clamp on the external carotid artery, then I released it on the common carotid artery.  After waiting a few seconds, I then released it on the internal carotid artery. Several minutes of pressure were held and 6-0 Prolene patch sutures were used as need for hemostasis.  At this point, I placed Surgicel and Vistacel topical hemostatic agents.  There was no more active bleeding in the surgical site.  The sternocleidomastoid space was closed with three interrupted 3-0 Vicryl sutures. I then reapproximated the platysma muscle with a running stitch of 3-0 Vicryl.  The skin was then closed with a running subcuticular 4-0 Monocryl.  The skin was then cleaned, dried and Dermabond was used to reinforce the skin closure.  The patient awakened and was taken to the recovery room in stable condition, following commands and moving all four extremities without any apparent deficits.    COMPLICATIONS: none  CONDITION: stable  Festus Barren  11/16/2022, 12:13 PM    This note was created with Dragon Medical transcription system. Any errors in dictation are purely unintentional.

## 2022-11-16 NOTE — Anesthesia Procedure Notes (Signed)
Procedure Name: Intubation Date/Time: 11/16/2022 10:38 AM  Performed by: Esaw Grandchild, CRNAPre-anesthesia Checklist: Patient identified, Emergency Drugs available, Suction available and Patient being monitored Patient Re-evaluated:Patient Re-evaluated prior to induction Oxygen Delivery Method: Circle system utilized Preoxygenation: Pre-oxygenation with 100% oxygen Induction Type: IV induction Ventilation: Oral airway inserted - appropriate to patient size Laryngoscope Size: McGraph and 3 Grade View: Grade I Tube type: Oral Tube size: 6.5 mm Number of attempts: 1 Airway Equipment and Method: Stylet, Oral airway, Bite block and LTA kit utilized Placement Confirmation: ETT inserted through vocal cords under direct vision, positive ETCO2 and breath sounds checked- equal and bilateral Secured at: 19 cm Tube secured with: Tape Dental Injury: Teeth and Oropharynx as per pre-operative assessment

## 2022-11-16 NOTE — Progress Notes (Signed)
[facility]  Daily Progress Note   Subjective  -   Doing well.  Not having a lot of pain.  Her back is the only thing that is bothering her currently.  Objective Vitals:   11/16/22 1600 11/16/22 1624 11/16/22 1625 11/16/22 1626  BP: (!) 118/49     Pulse: 63   62  Resp: 17   20  Temp:      TempSrc:      SpO2: 98% 95% 95% 96%  Weight:      Height:        Intake/Output Summary (Last 24 hours) at 11/16/2022 1653 Last data filed at 11/16/2022 1356 Gross per 24 hour  Intake 1800 ml  Output 190 ml  Net 1610 ml    PULM  CTAB CV  RRR VASC  neck with minimal swelling.  Neuroexam is stable from baseline.  Laboratory CBC    Component Value Date/Time   WBC 16.4 (H) 10/31/2022 0445   HGB 9.5 (L) 11/11/2022 1029   HCT 28.0 (L) 11/11/2022 1029   PLT 378 10/31/2022 0445    BMET    Component Value Date/Time   NA 135 11/11/2022 1029   K 3.4 (L) 11/11/2022 1029   CL 101 11/11/2022 1029   CO2 29 11/04/2022 0512   GLUCOSE 211 (H) 11/11/2022 1029   BUN 10 11/11/2022 1029   CREATININE 1.00 11/11/2022 1029   CALCIUM 8.2 (L) 11/04/2022 0512   GFRNONAA 57 (L) 11/04/2022 0512    Assessment/Planning: POD #0 s/p left CEA  Neurologic status appears to be at baseline No significant neck swelling.  Incision is clean, dry, and intact Not currently requiring pressors with normal blood pressure Can start regular diet in the morning, saline lock IV, and increase activity with hopes of discharge tomorrow   Kelly Rowland  11/16/2022, 4:53 PM

## 2022-11-16 NOTE — Transfer of Care (Signed)
Immediate Anesthesia Transfer of Care Note  Patient: Kelly Rowland  Procedure(s) Performed: ENDARTERECTOMY CAROTID (Left)  Patient Location: PACU  Anesthesia Type:General  Level of Consciousness: awake and alert   Airway & Oxygen Therapy: Patient Spontanous Breathing and Patient connected to face mask oxygen  Post-op Assessment: Report given to RN, Post -op Vital signs reviewed and stable, and Patient moving all extremities  Post vital signs: Reviewed and stable  Last Vitals:  Vitals Value Taken Time  BP 130/52 11/16/22 1230  Temp    Pulse 74 11/16/22 1233  Resp 15 11/16/22 1233  SpO2 98 % 11/16/22 1233  Vitals shown include unvalidated device data.  Last Pain:  Vitals:   11/16/22 0859  TempSrc: Temporal  PainSc: 0-No pain         Complications: No notable events documented.

## 2022-11-16 NOTE — Anesthesia Preprocedure Evaluation (Signed)
Anesthesia Evaluation  Patient identified by MRN, date of birth, ID band Patient awake  General Assessment Comment:  Aox3, pleasant lady, joking around appropriately.  Reviewed: Allergy & Precautions, H&P , NPO status , Patient's Chart, lab work & pertinent test results, reviewed documented beta blocker date and time   History of Anesthesia Complications Negative for: history of anesthetic complications  Airway Mallampati: II  TM Distance: >3 FB Neck ROM: full    Dental  (+) Lower Dentures, Upper Dentures   Pulmonary COPD, former smoker Uses PRN home O2   Pulmonary exam normal breath sounds clear to auscultation       Cardiovascular Exercise Tolerance: Poor hypertension, Pt. on medications + CAD and + Peripheral Vascular Disease  Normal cardiovascular exam Rhythm:regular Rate:Normal - Systolic murmurs    Neuro/Psych 10/25/2022 Acute Stroke Residual right sided weakness and sensory deficits  10/27/22 US Carotid: IMPRESSION: Right:  Heterogeneous and partially calcified plaque at the right carotid bifurcation contributes to 50%-69% stenosis by established duplex criteria.   Left:  Heterogeneous and partially calcified plaque at the left carotid bifurcation contributes to 70%-99% stenosis by established duplex criteria.  10/26/22 MRI Brain: IMPRESSION: 1. 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 2. Moderate chronic microvascular ischemic changes of the white matter. 3. Motion degraded MR angiogram of the head without evidence of hemodynamically significant stenosis.    CVA, Residual Symptoms  negative psych ROS   GI/Hepatic Neg liver ROS, PUD,,,  Endo/Other  diabetes, Well Controlled    Renal/GU CRFRenal disease  negative genitourinary   Musculoskeletal  (+) Arthritis ,    Abdominal   Peds  Hematology  (+) Blood  dyscrasia, anemia   Anesthesia Other Findings Past Medical History: No date: Arthritis No date: CAD (coronary artery disease) No date: Chronic kidney disease No date: COPD (chronic obstructive pulmonary disease) (HCC) No date: Diabetes mellitus without complication (HCC) No date: Hyperlipidemia No date: Hypertension No date: Restless legs syndrome (RLS) Past Surgical History: No date: ABDOMINAL HYSTERECTOMY No date: APPENDECTOMY 2019: CATARACT EXTRACTION; Bilateral No date: TONSILLECTOMY BMI    Body Mass Index: 26.34 kg/m     Reproductive/Obstetrics negative OB ROS                              Anesthesia Physical Anesthesia Plan  ASA: 4  Anesthesia Plan: General   Post-op Pain Management: Ofirmev IV (intra-op)*   Induction: Intravenous  PONV Risk Score and Plan: 2 and Ondansetron, Dexamethasone and Treatment may vary due to age or medical condition  Airway Management Planned: Oral ETT  Additional Equipment: Arterial line  Intra-op Plan:   Post-operative Plan: Extubation in OR  Informed Consent: I have reviewed the patients History and Physical, chart, labs and discussed the procedure including the risks, benefits and alternatives for the proposed anesthesia with the patient or authorized representative who has indicated his/her understanding and acceptance.     Dental advisory given  Plan Discussed with: CRNA and Surgeon  Anesthesia Plan Comments: (Discussed risks of anesthesia with patient, including PONV, sore throat, lip/dental/eye damage. Rare risks discussed as well, such as cardiorespiratory and neurological sequelae, and allergic reactions. Discussed the role of CRNA in patient's perioperative care. Patient understands. Patient counseled on being higher risk for anesthesia due to comorbidities: recent stroke. Patient was told about increased risk of cardiac and respiratory events, including death.  Plan for pre induction arterial  line, discussed with patient.)        Anesthesia Quick Evaluation

## 2022-11-17 ENCOUNTER — Encounter: Payer: Self-pay | Admitting: Vascular Surgery

## 2022-11-17 ENCOUNTER — Ambulatory Visit (INDEPENDENT_AMBULATORY_CARE_PROVIDER_SITE_OTHER): Payer: Self-pay | Admitting: Vascular Surgery

## 2022-11-17 DIAGNOSIS — Z8673 Personal history of transient ischemic attack (TIA), and cerebral infarction without residual deficits: Secondary | ICD-10-CM

## 2022-11-17 DIAGNOSIS — Z9889 Other specified postprocedural states: Secondary | ICD-10-CM

## 2022-11-17 DIAGNOSIS — I6522 Occlusion and stenosis of left carotid artery: Secondary | ICD-10-CM

## 2022-11-17 LAB — BASIC METABOLIC PANEL
Anion gap: 3 — ABNORMAL LOW (ref 5–15)
BUN: 11 mg/dL (ref 8–23)
CO2: 24 mmol/L (ref 22–32)
Calcium: 7.8 mg/dL — ABNORMAL LOW (ref 8.9–10.3)
Chloride: 109 mmol/L (ref 98–111)
Creatinine, Ser: 0.77 mg/dL (ref 0.44–1.00)
GFR, Estimated: >60 mL/min (ref >60)
Glucose, Bld: 136 mg/dL — ABNORMAL HIGH (ref 70–99)
Potassium: 4 mmol/L (ref 3.5–5.1)
Sodium: 136 mmol/L (ref 135–145)

## 2022-11-17 LAB — CBC
HCT: 25 % — ABNORMAL LOW (ref 36.0–46.0)
Hemoglobin: 7.3 g/dL — ABNORMAL LOW (ref 12.0–15.0)
MCH: 27 pg (ref 26.0–34.0)
MCHC: 29.2 g/dL — ABNORMAL LOW (ref 30.0–36.0)
MCV: 92.6 fL (ref 80.0–100.0)
Platelets: 174 10*3/uL (ref 150–400)
RBC: 2.7 MIL/uL — ABNORMAL LOW (ref 3.87–5.11)
RDW: 15.6 % — ABNORMAL HIGH (ref 11.5–15.5)
WBC: 8.9 10*3/uL (ref 4.0–10.5)
nRBC: 0 % (ref 0.0–0.2)

## 2022-11-17 LAB — SURGICAL PATHOLOGY

## 2022-11-17 NOTE — Progress Notes (Signed)
Kelly Rowland and Vascular Surgery  Daily Progress Note   Subjective  -   No events overnight.  Neuro exam at baseline.  Sore throat is biggest complaint  Objective Vitals:   11/17/22 0600 11/17/22 0715 11/17/22 0800 11/17/22 0815  BP: (!) 144/52  (!) 150/54   Pulse: 64 65 68 65  Resp: 19 20 20 19   Temp:      TempSrc:      SpO2: 96% 95% 96% 96%  Weight:      Height:        Intake/Output Summary (Last 24 hours) at 11/17/2022 0854 Last data filed at 11/17/2022 0800 Gross per 24 hour  Intake 3218.57 ml  Output 965 ml  Net 2253.57 ml    PULM  CTAB CV  RRR VASC  Neck with minimal swelling, incision is C/D/I  Laboratory CBC    Component Value Date/Time   WBC 8.9 11/17/2022 0459   HGB 7.3 (L) 11/17/2022 0459   HCT 25.0 (L) 11/17/2022 0459   PLT 174 11/17/2022 0459    BMET    Component Value Date/Time   NA 136 11/17/2022 0459   K 4.0 11/17/2022 0459   CL 109 11/17/2022 0459   CO2 24 11/17/2022 0459   GLUCOSE 136 (H) 11/17/2022 0459   BUN 11 11/17/2022 0459   CREATININE 0.77 11/17/2022 0459   CALCIUM 7.8 (L) 11/17/2022 0459   GFRNONAA >60 11/17/2022 0459    Assessment/Planning: POD #1 s/p left CEA for high grade stenosis with previous stroke  Doing well.  No issues overnight Plan to advance diet, increase activity, and likely home later today   14/04/2022  11/17/2022, 8:54 AM

## 2022-11-17 NOTE — TOC Transition Note (Signed)
Transition of Care [facility]) - CM/SW Discharge Note   Patient Details  Name: Monifa Blanchette MRN: [id number] Date of Birth: [birth year]  [facility] Care [facility]) CM/SW Contact:  Allayne Butcher, RN Phone Number: 11/17/2022, 3:15 PM   Clinical Narrative:    Patient medically cleared for discharge back to [facility] today.  EMS called for transport.  Patient going to room E14, bedside RN has called report to facility.    Final next level of care: Skilled Nursing Facility Barriers to Discharge: Barriers Resolved   Patient Goals and CMS Choice Patient states their goals for this hospitalization and ongoing recovery are:: wants to be home by Christmas CMS Medicare.gov Compare Post Acute Care list provided to:: Patient Choice offered to / list presented to : Patient  Discharge Placement              Patient chooses bed at: [facility] [place] Patient to be transferred to facility by: [facility] Name of family member notified: Riley Lam Patient and family notified of of transfer: 11/17/22  Discharge Plan and Services   Discharge Planning Services: CM Consult Post Acute Care Choice: Skilled Nursing Facility, Resumption of Svcs/PTA Provider          DME Arranged: N/A DME Agency: NA       HH Arranged: NA HH Agency: NA        Social Determinants of Health (SDOH) Interventions     Readmission Risk Interventions    11/17/2022   10:57 AM  Readmission Risk Prevention Plan  Transportation Screening Complete  PCP or Specialist Appt within 3-5 Days Complete  HRI or Home Care Consult Complete  Social Work Consult for Recovery Care Planning/Counseling Complete  Palliative Care Screening Not Applicable  Medication Review Oceanographer) Complete

## 2022-11-17 NOTE — TOC Initial Note (Signed)
Transition of Care [facility]) - Initial/Assessment Note    Patient Details  Name: Kelly Rowland MRN: [id number] Date of Birth: 1941-09-19  Transition of Care [facility]) CM/SW Contact:    Shelbie Hutching, RN Phone Number: 11/17/2022, 10:58 AM  Clinical Narrative:                 Patient admitted to the hospital with Carotid stenosis s/p endarterectomy.  RNCM met with patient at the bedside, introduced self and explained role in dc planing.  Patient came in from [facility] where she is staying for short term rehab.  She plans on returning to [facility] to continue rehab and hopes to be back home by Christmas.  Patient lives alone. She reports that she is up walking with a walker with therapy at facility and is able to feed herself with her right hand where she could not after her stroke.   TOC will cont to follow and assist with discharge to [facility].   Expected Discharge Plan: [place] Barriers to Discharge: Continued Medical Work up   Patient Goals and CMS Choice Patient states their goals for this hospitalization and ongoing recovery are:: wants to be home by Christmas CMS Medicare.gov Compare Post Acute Care list provided to:: Patient Choice offered to / list presented to : Patient  Expected Discharge Plan and Services Expected Discharge Plan: [place]   Discharge Planning Services: CM Consult Post Acute Care Choice: [place], Resumption of Svcs/PTA Provider Living arrangements for the past 2 months: Single Family Home                 DME Arranged: N/A DME Agency: NA       HH Arranged: NA HH Agency: NA        Prior Living Arrangements/Services Living arrangements for the past 2 months: Single Family Home Lives with:: Self Patient language and need for interpreter reviewed:: Yes Do you feel safe going back to the place where you live?: Yes      Need for Family Participation in Patient Care: Yes (Comment) Care giver support system in  place?: Yes (comment) Current home services: DME Criminal Activity/Legal Involvement Pertinent to Current Situation/Hospitalization: No - Comment as needed  Activities of Daily Living Home Assistive Devices/Equipment: Other (Comment), Eyeglasses, Dentures (specify type), Oxygen, Wheelchair (previously at rehab) ADL Screening (condition at time of admission) Patient's cognitive ability adequate to safely complete daily activities?: Yes Is the patient deaf or have difficulty hearing?: No Does the patient have difficulty seeing, even when wearing glasses/contacts?: No Does the patient have difficulty concentrating, remembering, or making decisions?: No Patient able to express need for assistance with ADLs?: Yes Does the patient have difficulty dressing or bathing?: Yes Independently performs ADLs?: No Does the patient have difficulty walking or climbing stairs?: Yes Weakness of Legs: Both Weakness of Arms/Hands: None  Permission Sought/Granted Permission sought to share information with : Facility Sport and exercise psychologist, Case Manager, Family Supports Permission granted to share information with : Yes, Verbal Permission Granted  Share Information with NAME: Jamoni Broadfoot  Permission granted to share info w AGENCY: [facility]  Permission granted to share info w Relationship: son  Permission granted to share info w Contact Information: [phone]  Emotional Assessment Appearance:: Appears stated age Attitude/Demeanor/Rapport: Engaged Affect (typically observed): Accepting Orientation: : Oriented to Self, Oriented to Place, Oriented to  Time, Oriented to Situation Alcohol / Substance Use: Not Applicable Psych Involvement: No (comment)  Admission diagnosis:  Carotid stenosis, symptomatic, with infarction [facility]) [  I63.239] Patient Active Problem List   Diagnosis Date Noted   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   Duodenal ulcer 11/04/2022   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  ([place]) 10/30/2022   Acute blood loss anemia 10/28/2022   Chronic kidney disease, stage 3b ([place]) 10/28/2022   Hyponatremia 10/28/2022   Carotid stenosis, symptomatic, with infarction ([place]) 10/28/2022   Duodenal ulcer with hemorrhage 10/22/2022   Hematochezia 10/22/2022   UGIB (upper gastrointestinal bleed) 10/18/2022   Acute upper GI bleeding 10/18/2022   GI bleed 10/17/2022   Hypokalemia 10/17/2022   Normocytic anemia 10/17/2022   Lumbosacral spondylosis without myelopathy 09/16/2022   Spinal stenosis of lumbar region 09/16/2022   Synovitis of ankle 07/02/2022   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   Intermittent claudication ([place]) 02/13/2020   Essential hypertension 12/05/2019   Bilateral carotid artery stenosis 12/26/2018   Diabetes mellitus ([place]) 11/22/2018   Moderate chronic obstructive pulmonary disease ([place]) 07/26/2018   Peripheral arterial disease ([place]) 11/09/2017   Restless legs 11/09/2017   Coronary artery disease 10/06/2017   Mixed hyperlipidemia 08/23/2017   Heart failure, unspecified ([place]) 08/23/2017   Peripheral edema 08/19/2017   Osteopenia 06/26/2015   PCP:  Volanda Napoleon, MD Pharmacy:   [id number] [id number]: [phone]: [phone]    Social Determinants of Health (SDOH) Interventions    Readmission Risk Interventions    11/17/2022   10:57 AM  Readmission Risk Prevention Plan  Transportation Screening Complete  PCP or Specialist Appt within 3-5 Days Complete  [place] or [place]  Social Work Consult for [place]sel[place] Complete  Palliative Care Screening Not Applicable  Medication Review (Press photographer Complete

## 2022-11-17 NOTE — Progress Notes (Signed)
Report called to [place], DON at [facility] rehab facility.

## 2022-11-17 NOTE — Progress Notes (Signed)
Pt's son Riley Lam given update on pt condition/location - as pt is being discharged back to rehab facility. EMS unable to take rehab facilities wheelchair with them at discharge. Family made aware - either family or rehab facility ([facility]) to pick wheelchair up from unit. Pt identification placed on wheelchair, charge RN made aware.

## 2022-11-17 NOTE — Discharge Summary (Signed)
[facility]    Discharge Summary    Patient ID:  Kelly Rowland MRN: [id number] DOB/AGE: July 13, 1941 81 y.o.  Admit date: 11/16/2022 Discharge date: 11/17/2022 Date of Surgery: 11/16/2022 Surgeon: Surgeon(s): Wyn Quaker Marlow Baars, MD  Admission Diagnosis: Carotid stenosis, symptomatic, with infarction [facility]) [I63.239]  Discharge Diagnoses:  Carotid stenosis, symptomatic, with infarction [facility]) [I63.239]  Secondary Diagnoses: Past Medical History:  Diagnosis Date   Acute respiratory failure (HCC)    Anemia    Arthritis    CAD (coronary artery disease)    Chronic kidney disease    Constipation    COPD (chronic obstructive pulmonary disease) (HCC)    Diabetes mellitus without complication (HCC)    Gout    Hyperlipidemia    Hypertension    Muscle weakness    Pain    Restless legs syndrome (RLS)    Stroke (HCC)     Procedure(s): ENDARTERECTOMY CAROTID  Discharged Condition: good  HPI:  [name] is a 81 year old female who is now status post left carotid endarterectomy.  He is postop day 1 and doing well.  Healing as expected.  Patient is having no neurodeficits at this time.  Patient is being discharged today back to rehab.  Hospital Course:  [name] is a 81 y.o. female is S/P Left Extubated: POD # 0 Physical Exam: Left neck incision is closed with Dermabond.  Signs or symptoms of hematoma seroma or infection. Alert notes x3, no acute distress Face: Symmetrical.  Tongue is midline. Neck: Trachea is midline.  No swelling or bruising. Cardiovascular: Regular rate and rhythm Pulmonary: Clear to auscultation bilaterally Abdomen: Soft, nontender, nondistended Right groin access: Clean dry and intact.  No swelling or drainage noted Left groin access: Clean dry and intact.  No swelling or drainage noted Left lower extremity: Thigh soft.  Calf soft.  Extremities warm distally toes.  Hard to palpate pedal pulses however the foot is warm is her  good capillary refill. Right lower extremity: Thigh soft.  Calf soft.  Extremities warm distally toes.  Hard to palpate pedal pulses however the foot is warm is her good capillary refill. Neurological: No deficits noted   Post-op wounds:  clean, dry, intact or healing well  Pt. Ambulating, voiding and taking PO diet without difficulty. Pt pain controlled with PO pain meds.  Labs:  As below  Complications: none  Consults:    Significant Diagnostic Studies: CBC Lab Results  Component Value Date   WBC 8.9 11/17/2022   HGB 7.3 (L) 11/17/2022   HCT 25.0 (L) 11/17/2022   MCV 92.6 11/17/2022   PLT 174 11/17/2022    BMET    Component Value Date/Time   NA 136 11/17/2022 0459   K 4.0 11/17/2022 0459   CL 109 11/17/2022 0459   CO2 24 11/17/2022 0459   GLUCOSE 136 (H) 11/17/2022 0459   BUN 11 11/17/2022 0459   CREATININE 0.77 11/17/2022 0459   CALCIUM 7.8 (L) 11/17/2022 0459   GFRNONAA >60 11/17/2022 0459   COAG Lab Results  Component Value Date   INR 1.0 10/17/2022     Disposition:  Discharge to :Skilled nursing facility  Allergies as of 11/17/2022       Reactions   Amoxicillin Anaphylaxis   Lisinopril Anaphylaxis   Tramadol Nausea Only   Colchicine Nausea Only        Medication List     TAKE these medications    amLODipine 5 MG tablet Commonly known as: NORVASC Take 5 mg  by mouth daily.   aspirin EC 81 MG tablet Take 81 mg by mouth daily.   atorvastatin 40 MG tablet Commonly known as: LIPITOR Take 1 tablet (40 mg total) by mouth at bedtime.   carvedilol 25 MG tablet Commonly known as: COREG Take 25 mg by mouth 2 (two) times daily.   Cholecalciferol 50 MCG (2000 UT) Caps Take 1 capsule by mouth daily.   cyanocobalamin 500 MCG tablet Commonly known as: VITAMIN B12 Take 1 tablet by mouth daily.   DAILY VITE PO Take 1 tablet by mouth daily.   fluticasone-salmeterol 250-50 MCG/ACT Aepb Commonly known as: ADVAIR Inhale 1 puff into the  lungs 2 (two) times daily.   folic acid 400 MCG tablet Commonly known as: FOLVITE Take 1 tablet by mouth daily.   furosemide 20 MG tablet Commonly known as: LASIX Take 1 tablet (20 mg total) by mouth daily.   gabapentin 100 MG capsule Commonly known as: NEURONTIN Take 100 mg by mouth at bedtime. Patients stats her orthopedic just prescribed this medication this past Thursday. She takes is every night at 0800PM   insulin glargine-yfgn 100 UNIT/ML injection Commonly known as: SEMGLEE Inject 0.12 mLs (12 Units total) into the skin daily. What changed: additional instructions   Klor-Con M20 20 MEQ tablet Generic drug: potassium chloride SA Take 20 mEq by mouth daily.   lidocaine 5 % Commonly known as: LIDODERM Place 1 patch onto the skin daily. Remove & Discard patch within 12 hours or as directed by MD   oxyCODONE-acetaminophen 5-325 MG tablet Commonly known as: PERCOCET/ROXICET Take 1-2 tablets by mouth every 6 (six) hours as needed.   pantoprazole 40 MG tablet Commonly known as: PROTONIX Take 1 tablet (40 mg total) by mouth 2 (two) times daily.   senna 8.6 MG Tabs tablet Commonly known as: SENOKOT Take 1 tablet (8.6 mg total) by mouth 2 (two) times daily.   Ventolin HFA 108 (90 Base) MCG/ACT inhaler Generic drug: albuterol Inhale 2 puffs into the lungs every 4 (four) hours as needed for shortness of breath, wheezing or cough.       Verbal and written Discharge instructions given to the patient. Wound care per Discharge AVS  Follow-up Information     Dew, Marlow Baars, MD. Schedule an appointment as soon as possible for a visit in 1 month(s).   Specialties: Vascular Surgery, Radiology, Interventional Cardiology Contact information: [address] [place] [address] [phone]                 Signed: Marcie Bal, NP  11/17/2022, 11:00 AM

## 2022-11-17 NOTE — Anesthesia Postprocedure Evaluation (Signed)
Anesthesia Post Note  Patient: Kelly Rowland  Procedure(s) Performed: ENDARTERECTOMY CAROTID (Left)  Patient location during evaluation: ICU Anesthesia Type: General Level of consciousness: awake and alert Pain management: pain level controlled Vital Signs Assessment: post-procedure vital signs reviewed and stable Respiratory status: spontaneous breathing, nonlabored ventilation, respiratory function stable and patient connected to nasal cannula oxygen Cardiovascular status: blood pressure returned to baseline and stable Postop Assessment: no apparent nausea or vomiting Anesthetic complications: no   No notable events documented.   Last Vitals:  Vitals:   11/17/22 1200 11/17/22 1300  BP: (!) 140/51 (!) 142/45  Pulse: 65 63  Resp:    Temp:    SpO2: 95% 96%    Last Pain:  Vitals:   11/17/22 1200  TempSrc:   PainSc: 0-No pain                 Corinda Gubler

## 2022-11-17 NOTE — [place] FL2 (Signed)
[place] MEDICAID FL2 LEVEL OF CARE FORM     IDENTIFICATION  Patient Name: Kelly Rowland Birthdate: 1941-11-08 Sex: female Admission Date (Current Location): 11/16/2022  [place] and [place] Number:  Chiropodist and Address:  [facility], [address] [place], [place] [address]      Provider Number: [id number]  Attending Physician Name and Address:  Annice Needy, MD  Relative Name and Phone Number:  Ryhanna Dunsmore- son- [phone]    Current Level of Care: Hospital Recommended Level of Care: Skilled Nursing Facility Prior Approval Number:    Date Approved/Denied:   PASRR Number: [id number]  Discharge Plan: SNF    Current Diagnoses: Patient Active Problem List   Diagnosis Date Noted   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   Duodenal ulcer 11/04/2022   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) 10/30/2022   Acute blood loss anemia 10/28/2022   Chronic kidney disease, stage 3b (HCC) 10/28/2022   Hyponatremia 10/28/2022   Carotid stenosis, symptomatic, with infarction (HCC) 10/28/2022   Duodenal ulcer with hemorrhage 10/22/2022   Hematochezia 10/22/2022   UGIB (upper gastrointestinal bleed) 10/18/2022   Acute upper GI bleeding 10/18/2022   GI bleed 10/17/2022   Hypokalemia 10/17/2022   Normocytic anemia 10/17/2022   Lumbosacral spondylosis without myelopathy 09/16/2022   Spinal stenosis of lumbar region 09/16/2022   Synovitis of ankle 07/02/2022   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   Intermittent claudication (HCC) 02/13/2020   Essential hypertension 12/05/2019   Bilateral carotid artery stenosis 12/26/2018   Diabetes mellitus (HCC) 11/22/2018   Moderate chronic obstructive pulmonary disease (HCC) 07/26/2018   Peripheral arterial disease (HCC) 11/09/2017   Restless legs 11/09/2017   Coronary artery disease 10/06/2017   Mixed hyperlipidemia 08/23/2017    Heart failure, unspecified (HCC) 08/23/2017   Peripheral edema 08/19/2017   Osteopenia 06/26/2015    Orientation RESPIRATION BLADDER Height & Weight     Self, Time, Situation, Place  Normal (Experiment 3L) Continent Weight: 62.4 kg Height:  5\' 2"  (157.5 cm)  BEHAVIORAL SYMPTOMS/MOOD NEUROLOGICAL BOWEL NUTRITION STATUS      Continent Diet (see discharge summary)  AMBULATORY STATUS COMMUNICATION OF NEEDS Skin   Extensive Assist Verbally Surgical wounds (left neck incision)                       Personal Care Assistance Level of Assistance  Bathing, Feeding, Dressing Bathing Assistance: Limited assistance Feeding assistance: Limited assistance Dressing Assistance: Limited assistance     Functional Limitations Info  Sight, Hearing, Speech Sight Info: Adequate Hearing Info: Adequate Speech Info: Adequate    SPECIAL CARE FACTORS FREQUENCY  PT (By licensed PT), OT (By licensed OT)     PT Frequency: 5 times per week OT Frequency: 5 times per week            Contractures Contractures Info: Not present    Additional Factors Info  Code Status, Allergies Code Status Info: Full Allergies Info: Amoxicillin, Lisinopril, Tramadol, Gabapentin, Colchicine           Current Medications (11/17/2022):  This is the current hospital active medication list Current Facility-Administered Medications  Medication Dose Route Frequency Provider Last Rate Last Admin   0.9 %  sodium chloride infusion   Intravenous Continuous 14/04/2022, MD 75 mL/hr at 11/17/22 0947 Infusion Verify at 11/17/22 0947   0.9 %  sodium chloride infusion  500 mL Intravenous Once PRN  Annice Needy, MD       acetaminophen (TYLENOL) tablet 325-650 mg  325-650 mg Oral Q4H PRN Annice Needy, MD   650 mg at 11/16/22 2154   Or   acetaminophen (TYLENOL) suppository 325-650 mg  325-650 mg Rectal Q4H PRN Annice Needy, MD       albuterol (PROVENTIL) (2.5 MG/3ML) 0.083% nebulizer solution 3 mL  3 mL Inhalation Q4H PRN Annice Needy, MD       alum & mag hydroxide-simeth (MAALOX/MYLANTA) 200-200-20 MG/5ML suspension 15-30 mL  15-30 mL Oral Q2H PRN Annice Needy, MD       amLODipine (NORVASC) tablet 5 mg  5 mg Oral Daily Annice Needy, MD   5 mg at 11/17/22 0818   aspirin EC tablet 81 mg  81 mg Oral Daily Annice Needy, MD   81 mg at 11/17/22 0818   atorvastatin (LIPITOR) tablet 40 mg  40 mg Oral QHS Annice Needy, MD   40 mg at 11/16/22 2154   carvedilol (COREG) tablet 25 mg  25 mg Oral BID AC Annice Needy, MD   25 mg at 11/17/22 7824   Chlorhexidine Gluconate Cloth 2 % PADS 6 each  6 each Topical Daily Annice Needy, MD   6 each at 11/17/22 0824   cholecalciferol (VITAMIN D3) 25 MCG (1000 UNIT) tablet 2,000 Units  2,000 Units Oral Daily Annice Needy, MD   2,000 Units at 11/17/22 0819   docusate sodium (COLACE) capsule 100 mg  100 mg Oral Daily Annice Needy, MD   100 mg at 11/17/22 0818   famotidine (PEPCID) IVPB 20 mg premix  20 mg Intravenous Q12H Annice Needy, MD   Stopped at 11/17/22 0902   folic acid (FOLVITE) tablet 1 mg  1,000 mcg Oral Daily Annice Needy, MD   1 mg at 11/17/22 2353   furosemide (LASIX) tablet 20 mg  20 mg Oral Daily Annice Needy, MD   20 mg at 11/17/22 0819   gabapentin (NEURONTIN) capsule 100 mg  100 mg Oral QHS Annice Needy, MD   100 mg at 11/16/22 2154   guaiFENesin-dextromethorphan (ROBITUSSIN DM) 100-10 MG/5ML syrup 15 mL  15 mL Oral Q4H PRN Annice Needy, MD   15 mL at 11/16/22 1401   hydrALAZINE (APRESOLINE) injection 5 mg  5 mg Intravenous Q20 Min PRN Annice Needy, MD       labetalol (NORMODYNE) injection 10 mg  10 mg Intravenous Q10 min PRN Dew, Marlow Baars, MD       magnesium sulfate IVPB 2 g 50 mL  2 g Intravenous Daily PRN Annice Needy, MD       metoprolol tartrate (LOPRESSOR) injection 2-5 mg  2-5 mg Intravenous Q2H PRN Annice Needy, MD       mometasone-formoterol (DULERA) 200-5 MCG/ACT inhaler 2 puff  2 puff Inhalation BID Annice Needy, MD   2 puff at 11/17/22 0824   morphine (PF) 2 MG/ML  injection 2-5 mg  2-5 mg Intravenous Q1H PRN Annice Needy, MD       multivitamin with minerals tablet 1 tablet  1 tablet Oral Daily Annice Needy, MD   1 tablet at 11/17/22 0817   nitroGLYCERIN 50 mg in dextrose 5 % 250 mL (0.2 mg/mL) infusion  5-250 mcg/min Intravenous Titrated Annice Needy, MD   Held at 11/16/22 1455   ondansetron (ZOFRAN) injection 4 mg  4 mg  Intravenous Q6H PRN Annice Needy, MD       oxyCODONE-acetaminophen (PERCOCET/ROXICET) 5-325 MG per tablet 1-2 tablet  1-2 tablet Oral Q6H PRN Annice Needy, MD   2 tablet at 11/17/22 0830   pantoprazole (PROTONIX) EC tablet 40 mg  40 mg Oral BID Annice Needy, MD   40 mg at 11/17/22 0817   phenol (CHLORASEPTIC) mouth spray 1 spray  1 spray Mouth/Throat PRN Annice Needy, MD       potassium chloride SA (KLOR-CON M) CR tablet 20 mEq  20 mEq Oral Daily Annice Needy, MD   20 mEq at 11/17/22 0818   potassium chloride SA (KLOR-CON M) CR tablet 20-40 mEq  20-40 mEq Oral Daily PRN Annice Needy, MD       senna (SENOKOT) tablet 8.6 mg  1 tablet Oral BID Annice Needy, MD   8.6 mg at 11/16/22 2154   vancomycin (VANCOCIN) IVPB 1000 mg/200 mL premix  1,000 mg Intravenous Q12H Annice Needy, MD   Stopped at 11/16/22 2351   vitamin B-12 (CYANOCOBALAMIN) tablet 500 mcg  500 mcg Oral Daily Annice Needy, MD   500 mcg at 11/17/22 0818     Discharge Medications: Please see discharge summary for a list of discharge medications.  Relevant Imaging Results:  Relevant Lab Results:   Additional Information ss [id number]  Allayne Butcher, RN

## 2022-11-21 ENCOUNTER — Other Ambulatory Visit: Payer: Self-pay

## 2022-11-21 ENCOUNTER — Inpatient Hospital Stay
Admission: EM | Admit: 2022-11-21 | Discharge: 2022-12-01 | DRG: 177 | Disposition: A | Discharge status: Skilled Nursing Facility | Payer: Medicare Other | Attending: Internal Medicine | Admitting: Internal Medicine

## 2022-11-21 ENCOUNTER — Emergency Department: Payer: Medicare Other

## 2022-11-21 ENCOUNTER — Encounter: Payer: Self-pay | Admitting: Emergency Medicine

## 2022-11-21 DIAGNOSIS — N1831 Chronic kidney disease, stage 3a: Secondary | ICD-10-CM | POA: Diagnosis present

## 2022-11-21 DIAGNOSIS — R0602 Shortness of breath: Principal | ICD-10-CM | POA: Diagnosis present

## 2022-11-21 DIAGNOSIS — I82431 Acute embolism and thrombosis of right popliteal vein: Secondary | ICD-10-CM | POA: Diagnosis present

## 2022-11-21 DIAGNOSIS — I5033 Acute on chronic diastolic (congestive) heart failure: Secondary | ICD-10-CM

## 2022-11-21 DIAGNOSIS — I130 Hypertensive heart and chronic kidney disease with heart failure and stage 1 through stage 4 chronic kidney disease, or unspecified chronic kidney disease: Secondary | ICD-10-CM | POA: Diagnosis present

## 2022-11-21 DIAGNOSIS — J9601 Acute respiratory failure with hypoxia: Secondary | ICD-10-CM | POA: Diagnosis present

## 2022-11-21 DIAGNOSIS — J449 Chronic obstructive pulmonary disease, unspecified: Secondary | ICD-10-CM | POA: Diagnosis present

## 2022-11-21 DIAGNOSIS — I5031 Acute diastolic (congestive) heart failure: Secondary | ICD-10-CM | POA: Diagnosis present

## 2022-11-21 DIAGNOSIS — Z8711 Personal history of peptic ulcer disease: Secondary | ICD-10-CM

## 2022-11-21 DIAGNOSIS — Z79899 Other long term (current) drug therapy: Secondary | ICD-10-CM

## 2022-11-21 DIAGNOSIS — I2510 Atherosclerotic heart disease of native coronary artery without angina pectoris: Secondary | ICD-10-CM | POA: Diagnosis present

## 2022-11-21 DIAGNOSIS — J1282 Pneumonia due to coronavirus disease 2019: Secondary | ICD-10-CM | POA: Diagnosis present

## 2022-11-21 DIAGNOSIS — E876 Hypokalemia: Secondary | ICD-10-CM | POA: Diagnosis not present

## 2022-11-21 DIAGNOSIS — Z8673 Personal history of transient ischemic attack (TIA), and cerebral infarction without residual deficits: Secondary | ICD-10-CM | POA: Diagnosis not present

## 2022-11-21 DIAGNOSIS — K921 Melena: Secondary | ICD-10-CM | POA: Diagnosis present

## 2022-11-21 DIAGNOSIS — Z888 Allergy status to other drugs, medicaments and biological substances status: Secondary | ICD-10-CM | POA: Diagnosis not present

## 2022-11-21 DIAGNOSIS — E1122 Type 2 diabetes mellitus with diabetic chronic kidney disease: Secondary | ICD-10-CM | POA: Diagnosis present

## 2022-11-21 DIAGNOSIS — I10 Essential (primary) hypertension: Secondary | ICD-10-CM | POA: Diagnosis present

## 2022-11-21 DIAGNOSIS — D500 Iron deficiency anemia secondary to blood loss (chronic): Secondary | ICD-10-CM | POA: Diagnosis present

## 2022-11-21 DIAGNOSIS — J439 Emphysema, unspecified: Secondary | ICD-10-CM | POA: Diagnosis present

## 2022-11-21 DIAGNOSIS — K269 Duodenal ulcer, unspecified as acute or chronic, without hemorrhage or perforation: Secondary | ICD-10-CM | POA: Diagnosis present

## 2022-11-21 DIAGNOSIS — E1165 Type 2 diabetes mellitus with hyperglycemia: Secondary | ICD-10-CM | POA: Diagnosis not present

## 2022-11-21 DIAGNOSIS — Z794 Long term (current) use of insulin: Secondary | ICD-10-CM

## 2022-11-21 DIAGNOSIS — I82439 Acute embolism and thrombosis of unspecified popliteal vein: Secondary | ICD-10-CM

## 2022-11-21 DIAGNOSIS — Z7982 Long term (current) use of aspirin: Secondary | ICD-10-CM

## 2022-11-21 DIAGNOSIS — Z9841 Cataract extraction status, right eye: Secondary | ICD-10-CM

## 2022-11-21 DIAGNOSIS — N1832 Chronic kidney disease, stage 3b: Secondary | ICD-10-CM | POA: Diagnosis present

## 2022-11-21 DIAGNOSIS — I82451 Acute embolism and thrombosis of right peroneal vein: Secondary | ICD-10-CM | POA: Diagnosis present

## 2022-11-21 DIAGNOSIS — G2581 Restless legs syndrome: Secondary | ICD-10-CM | POA: Diagnosis present

## 2022-11-21 DIAGNOSIS — Z885 Allergy status to narcotic agent status: Secondary | ICD-10-CM | POA: Diagnosis not present

## 2022-11-21 DIAGNOSIS — E1151 Type 2 diabetes mellitus with diabetic peripheral angiopathy without gangrene: Secondary | ICD-10-CM | POA: Diagnosis present

## 2022-11-21 DIAGNOSIS — Z9842 Cataract extraction status, left eye: Secondary | ICD-10-CM | POA: Diagnosis not present

## 2022-11-21 DIAGNOSIS — M109 Gout, unspecified: Secondary | ICD-10-CM | POA: Diagnosis present

## 2022-11-21 DIAGNOSIS — I509 Heart failure, unspecified: Secondary | ICD-10-CM

## 2022-11-21 DIAGNOSIS — E119 Type 2 diabetes mellitus without complications: Secondary | ICD-10-CM

## 2022-11-21 DIAGNOSIS — D62 Acute posthemorrhagic anemia: Secondary | ICD-10-CM | POA: Diagnosis present

## 2022-11-21 DIAGNOSIS — E440 Moderate protein-calorie malnutrition: Secondary | ICD-10-CM | POA: Insufficient documentation

## 2022-11-21 DIAGNOSIS — E785 Hyperlipidemia, unspecified: Secondary | ICD-10-CM | POA: Diagnosis present

## 2022-11-21 DIAGNOSIS — K31819 Angiodysplasia of stomach and duodenum without bleeding: Secondary | ICD-10-CM

## 2022-11-21 DIAGNOSIS — J129 Viral pneumonia, unspecified: Secondary | ICD-10-CM

## 2022-11-21 DIAGNOSIS — K922 Gastrointestinal hemorrhage, unspecified: Secondary | ICD-10-CM | POA: Diagnosis present

## 2022-11-21 DIAGNOSIS — I739 Peripheral vascular disease, unspecified: Secondary | ICD-10-CM | POA: Diagnosis present

## 2022-11-21 DIAGNOSIS — U071 COVID-19: Principal | ICD-10-CM | POA: Diagnosis present

## 2022-11-21 DIAGNOSIS — K264 Chronic or unspecified duodenal ulcer with hemorrhage: Secondary | ICD-10-CM | POA: Diagnosis present

## 2022-11-21 LAB — CBC WITH DIFFERENTIAL/PLATELET
Abs Immature Granulocytes: 0.03 10*3/uL (ref 0.00–0.07)
Basophils Absolute: 0 10*3/uL (ref 0.0–0.1)
Basophils Relative: 0 %
Eosinophils Absolute: 0 10*3/uL (ref 0.0–0.5)
Eosinophils Relative: 1 %
HCT: 24.6 % — ABNORMAL LOW (ref 36.0–46.0)
Hemoglobin: 7.6 g/dL — ABNORMAL LOW (ref 12.0–15.0)
Immature Granulocytes: 1 %
Lymphocytes Relative: 13 %
Lymphs Abs: 0.6 10*3/uL — ABNORMAL LOW (ref 0.7–4.0)
MCH: 27.5 pg (ref 26.0–34.0)
MCHC: 30.9 g/dL (ref 30.0–36.0)
MCV: 89.1 fL (ref 80.0–100.0)
Monocytes Absolute: 0.5 10*3/uL (ref 0.1–1.0)
Monocytes Relative: 11 %
Neutro Abs: 3.4 10*3/uL (ref 1.7–7.7)
Neutrophils Relative %: 74 %
Platelets: 236 10*3/uL (ref 150–400)
RBC: 2.76 MIL/uL — ABNORMAL LOW (ref 3.87–5.11)
RDW: 15.6 % — ABNORMAL HIGH (ref 11.5–15.5)
WBC: 4.6 10*3/uL (ref 4.0–10.5)
nRBC: 0 % (ref 0.0–0.2)

## 2022-11-21 LAB — COMPREHENSIVE METABOLIC PANEL
ALT: 13 U/L (ref 0–44)
AST: 16 U/L (ref 15–41)
Albumin: 2.3 g/dL — ABNORMAL LOW (ref 3.5–5.0)
Alkaline Phosphatase: 80 U/L (ref 38–126)
Anion gap: 8 (ref 5–15)
BUN: 13 mg/dL (ref 8–23)
CO2: 28 mmol/L (ref 22–32)
Calcium: 8 mg/dL — ABNORMAL LOW (ref 8.9–10.3)
Chloride: 98 mmol/L (ref 98–111)
Creatinine, Ser: 0.84 mg/dL (ref 0.44–1.00)
GFR, Estimated: >60 mL/min (ref >60)
Glucose, Bld: 227 mg/dL — ABNORMAL HIGH (ref 70–99)
Potassium: 3.4 mmol/L — ABNORMAL LOW (ref 3.5–5.1)
Sodium: 134 mmol/L — ABNORMAL LOW (ref 135–145)
Total Bilirubin: 0.6 mg/dL (ref 0.3–1.2)
Total Protein: 5.2 g/dL — ABNORMAL LOW (ref 6.5–8.1)

## 2022-11-21 LAB — RESP PANEL BY RT-PCR (RSV, FLU A&B, COVID)  RVPGX2
Influenza A by PCR: NEGATIVE
Influenza B by PCR: NEGATIVE
Resp Syncytial Virus by PCR: NEGATIVE
SARS Coronavirus 2 by RT PCR: POSITIVE — AB

## 2022-11-21 LAB — BLOOD GAS, VENOUS
Acid-Base Excess: 5.8 mmol/L — ABNORMAL HIGH (ref 0.0–2.0)
Bicarbonate: 30.6 mmol/L — ABNORMAL HIGH (ref 20.0–28.0)
O2 Saturation: 94.9 %
Patient temperature: 37
pCO2, Ven: 44 mmHg (ref 44–60)
pH, Ven: 7.45 — ABNORMAL HIGH (ref 7.25–7.43)
pO2, Ven: 66 mmHg — ABNORMAL HIGH (ref 32–45)

## 2022-11-21 LAB — BRAIN NATRIURETIC PEPTIDE: B Natriuretic Peptide: 445.8 pg/mL — ABNORMAL HIGH (ref 0.0–100.0)

## 2022-11-21 LAB — TROPONIN I (HIGH SENSITIVITY): Troponin I (High Sensitivity): 9 ng/L (ref <18)

## 2022-11-21 MED ORDER — INSULIN ASPART 100 UNIT/ML IJ SOLN
0.0000 [IU] | Freq: Three times a day (TID) | INTRAMUSCULAR | Status: DC
  Administered 2022-11-22: 5 [IU] via SUBCUTANEOUS
  Administered 2022-11-22: 3 [IU] via SUBCUTANEOUS
  Administered 2022-11-22: 5 [IU] via SUBCUTANEOUS
  Administered 2022-11-23: 3 [IU] via SUBCUTANEOUS
  Administered 2022-11-23: 5 [IU] via SUBCUTANEOUS
  Administered 2022-11-23 – 2022-11-24 (×3): 3 [IU] via SUBCUTANEOUS
  Administered 2022-11-24: 5 [IU] via SUBCUTANEOUS
  Administered 2022-11-25: 2 [IU] via SUBCUTANEOUS
  Administered 2022-11-25: 8 [IU] via SUBCUTANEOUS
  Administered 2022-11-25: 5 [IU] via SUBCUTANEOUS
  Administered 2022-11-26: 3 [IU] via SUBCUTANEOUS
  Administered 2022-11-26 (×2): 8 [IU] via SUBCUTANEOUS
  Administered 2022-11-27: 3 [IU] via SUBCUTANEOUS
  Administered 2022-11-27: 2 [IU] via SUBCUTANEOUS
  Administered 2022-11-27: 5 [IU] via SUBCUTANEOUS
  Administered 2022-11-28 (×2): 3 [IU] via SUBCUTANEOUS
  Administered 2022-11-29: 2 [IU] via SUBCUTANEOUS
  Administered 2022-11-29 – 2022-11-30 (×2): 3 [IU] via SUBCUTANEOUS
  Administered 2022-11-30 – 2022-12-01 (×4): 2 [IU] via SUBCUTANEOUS
  Filled 2022-11-21 (×24): qty 1

## 2022-11-21 MED ORDER — ACETAMINOPHEN 325 MG PO TABS
650.0000 mg | ORAL_TABLET | Freq: Four times a day (QID) | ORAL | Status: DC | PRN
  Administered 2022-11-22 – 2022-11-23 (×2): 650 mg via ORAL
  Filled 2022-11-21 (×2): qty 2

## 2022-11-21 MED ORDER — SODIUM CHLORIDE 0.9 % IV SOLN
200.0000 mg | Freq: Once | INTRAVENOUS | Status: AC
  Administered 2022-11-22: 200 mg via INTRAVENOUS
  Filled 2022-11-21: qty 40

## 2022-11-21 MED ORDER — ALBUTEROL SULFATE HFA 108 (90 BASE) MCG/ACT IN AERS: 2.0000 | INHALATION_SPRAY | RESPIRATORY_TRACT | Status: DC | PRN

## 2022-11-21 MED ORDER — HYDROCODONE-ACETAMINOPHEN 5-325 MG PO TABS
1.0000 | ORAL_TABLET | ORAL | Status: DC | PRN
  Administered 2022-11-22 (×3): 1 via ORAL
  Filled 2022-11-21 (×3): qty 1

## 2022-11-21 MED ORDER — MOMETASONE FURO-FORMOTEROL FUM 200-5 MCG/ACT IN AERO
2.0000 | INHALATION_SPRAY | Freq: Two times a day (BID) | RESPIRATORY_TRACT | Status: DC
  Administered 2022-11-22 – 2022-12-01 (×17): 2 via RESPIRATORY_TRACT
  Filled 2022-11-21 (×3): qty 8.8

## 2022-11-21 MED ORDER — PANTOPRAZOLE SODIUM 40 MG IV SOLR
40.0000 mg | Freq: Two times a day (BID) | INTRAVENOUS | Status: DC
  Administered 2022-11-22 – 2022-11-27 (×12): 40 mg via INTRAVENOUS
  Filled 2022-11-21 (×12): qty 10

## 2022-11-21 MED ORDER — AMLODIPINE BESYLATE 5 MG PO TABS
5.0000 mg | ORAL_TABLET | Freq: Every day | ORAL | Status: DC
  Administered 2022-11-22 – 2022-12-01 (×10): 5 mg via ORAL
  Filled 2022-11-21 (×10): qty 1

## 2022-11-21 MED ORDER — IOHEXOL 350 MG/ML SOLN: 75.0000 mL | Freq: Once | INTRAVENOUS | Status: DC | PRN

## 2022-11-21 MED ORDER — ATORVASTATIN CALCIUM 20 MG PO TABS
40.0000 mg | ORAL_TABLET | Freq: Every day | ORAL | Status: DC
  Administered 2022-11-22 – 2022-11-30 (×10): 40 mg via ORAL
  Filled 2022-11-21 (×10): qty 2

## 2022-11-21 MED ORDER — ACETAMINOPHEN 650 MG RE SUPP: 650.0000 mg | Freq: Four times a day (QID) | RECTAL | Status: DC | PRN

## 2022-11-21 MED ORDER — SODIUM CHLORIDE 0.9 % IV SOLN
100.0000 mg | Freq: Every day | INTRAVENOUS | Status: AC
  Administered 2022-11-22 – 2022-11-23 (×2): 100 mg via INTRAVENOUS
  Filled 2022-11-21: qty 20
  Filled 2022-11-21: qty 100

## 2022-11-21 MED ORDER — CARVEDILOL 25 MG PO TABS
25.0000 mg | ORAL_TABLET | Freq: Two times a day (BID) | ORAL | Status: DC
  Administered 2022-11-22 – 2022-12-01 (×19): 25 mg via ORAL
  Filled 2022-11-21: qty 4
  Filled 2022-11-21 (×9): qty 1
  Filled 2022-11-21: qty 4
  Filled 2022-11-21 (×7): qty 1
  Filled 2022-11-21: qty 4

## 2022-11-21 MED ORDER — SODIUM CHLORIDE 0.9 % IV SOLN: INTRAVENOUS | Status: DC

## 2022-11-21 MED ORDER — SODIUM CHLORIDE 0.9% FLUSH
3.0000 mL | Freq: Two times a day (BID) | INTRAVENOUS | Status: DC
  Administered 2022-11-22 – 2022-12-01 (×19): 3 mL via INTRAVENOUS

## 2022-11-21 MED ORDER — IPRATROPIUM-ALBUTEROL 0.5-2.5 (3) MG/3ML IN SOLN
3.0000 mL | Freq: Once | RESPIRATORY_TRACT | Status: AC
  Administered 2022-11-21: 3 mL via RESPIRATORY_TRACT
  Filled 2022-11-21: qty 3

## 2022-11-21 NOTE — Assessment & Plan Note (Signed)
Vitals:   11/21/22 1844 11/21/22 1845 11/21/22 2030 11/21/22 2100  BP: 138/64 138/64 (!) 152/51 (!) 141/52   11/21/22 2130  BP: (!) 145/49  Stable cont BB therapy. Hold diuretic therapy.

## 2022-11-21 NOTE — ED Triage Notes (Signed)
Pt via EMS from [facility]. Pt c/o increasingly SOB and cough, productive. Pt was dx with COVID 2 days ago, states that they put her on oxygen 2 days ago but can't recall if her sats were low. Denies CP. Pt only reports chronic back pain. Pt has sats have maintained above 92% on 3L [place] with EMS. EMS also gave 1 Duoneb treatment. Pt is A&Ox4 and NAD

## 2022-11-21 NOTE — Assessment & Plan Note (Signed)
    Latest Ref Rng & Units 11/21/2022    7:03 PM 11/17/2022    4:59 AM 11/11/2022   10:29 AM  CBC  WBC 4.0 - 10.5 K/uL 4.6  8.9    Hemoglobin 12.0 - 15.0 g/dL 7.6  7.3  9.5   Hematocrit 36.0 - 46.0 % 24.6  25.0  28.0   Platelets 150 - 400 K/uL 236  174    Attribute to GIB.  IV PPI. Type/ screen/ transfuse 1 unit.  Occult. GI Consult per am team.

## 2022-11-21 NOTE — ED Notes (Signed)
Patient is sleeping at this time. Cough is occasional. NAD.

## 2022-11-21 NOTE — Assessment & Plan Note (Signed)
Secondary to combination of iron deficiency anemia, degenerative disc disease and arthritis, PAD.

## 2022-11-21 NOTE — ED Provider Notes (Signed)
[facility] Provider Note   Event Date/Time   First MD Initiated Contact with Patient 11/21/22 1849     (approximate) History  Shortness of Breath  HPI Kelly Rowland is a 81 y.o. 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  Patient has been wearing 3 L oxygen by nasal cannula over the last 3 days due to the shortness of breath.  Patient states that she has never needed oxygen before her admission to the hospital approximately 1 week prior to arrival.  Of note, patient was diagnosed with COVID 2 days prior to arrival patient patient currently denies any vision changes, tinnitus, difficulty speaking, facial droop, sore throat, chest pain, shortness of breath, abdominal pain, nausea/vomiting/diarrhea, dysuria, or weakness/numbness/paresthesias in any extremity rehabilitation center did not note patient to be hypoxic.  EMS notes patient to be 92% on 3 L cannula.  EMS did give 1 DuoNeb treatment prior to arrival ROS: Patient currently denies any vision changes, tinnitus, difficulty speaking, facial droop, sore throat, chest pain, abdominal pain, nausea/vomiting/diarrhea, dysuria, or weakness/numbness/paresthesias in any extremity   Physical Exam  Triage Vital Signs: ED Triage Vitals  Enc Vitals Group     BP 11/21/22 1844 138/64     Pulse Rate 11/21/22 1842 70     Resp 11/21/22 1842 19     Temp --      Temp src --      SpO2 11/21/22 1842 96 %     Weight 11/21/22 1854 150 lb (68 kg)     Height 11/21/22 1854 5\' 2"  (1.575 m)     Head Circumference --      Peak Flow --      Pain Score 11/21/22 1850 3     Pain Loc --      Pain Edu? --      Excl. in GC? --    Most recent vital signs: Vitals:   11/21/22 2130 11/21/22 2200  BP: (!) 145/49 (!) 165/53  Pulse: 66 67  Resp: 19 20  SpO2: 90% 90%   General: Awake, oriented x4. CV:  Good  peripheral perfusion.  Resp:  Normal effort.  Rhonchi over bilateral lung fields Abd:  No distention.  Other:  Elderly, overweight female laying in bed distress, 3 L [place] ED Results / Procedures / Treatments  Labs (all labs ordered are listed, but only abnormal results are displayed) Labs Reviewed  RESP PANEL BY RT-PCR (RSV, FLU A&B, COVID)  [id number] - Abnormal; Notable for the following components:      Result Value   SARS Coronavirus 2 by RT PCR POSITIVE (*)    All other components within normal limits  BLOOD GAS, VENOUS - Abnormal; Notable for the following components:   pH, Ven 7.45 (*)    pO2, Ven 66 (*)    Bicarbonate 30.6 (*)    Acid-Base Excess 5.8 (*)    All other components within normal limits  COMPREHENSIVE METABOLIC PANEL - Abnormal; Notable for the following components:   Sodium 134 (*)    Potassium 3.4 (*)    Glucose, Bld 227 (*)    Calcium 8.0 (*)    Total Protein 5.2 (*)    Albumin 2.3 (*)    All other components within normal limits  CBC WITH DIFFERENTIAL/PLATELET - Abnormal; Notable for the following components:   RBC 2.76 (*)    Hemoglobin 7.6 (*)  HCT 24.6 (*)    RDW 15.6 (*)    Lymphs Abs 0.6 (*)    All other components within normal limits  BRAIN NATRIURETIC PEPTIDE - Abnormal; Notable for the following components:   B Natriuretic Peptide 445.8 (*)    All other components within normal limits  COMPREHENSIVE METABOLIC PANEL  CBC  TYPE AND SCREEN  TROPONIN I (HIGH SENSITIVITY)   EKG ED ECG REPORT I, Merwyn KatosEvan K Cleve Paolillo, the attending physician, personally viewed and interpreted this ECG. Date: 11/21/2022 EKG Time: 1843 Rate: 69 Rhythm: normal sinus rhythm QRS Axis: normal Intervals: normal ST/T Wave abnormalities: normal Narrative Interpretation: no evidence of acute ischemia RADIOLOGY ED MD interpretation: Single view portable chest x-ray interpreted by me independently shows patchy bibasilar opacities  -Agree with radiology assessment Official  radiology report(s): CT CHEST WO CONTRAST  Result Date: 11/21/2022 CLINICAL DATA:  Pulmonary embolism (PE) suspected, high prob. Dyspnea, cough EXAM: CT CHEST WITHOUT CONTRAST TECHNIQUE: Multidetector CT imaging of the chest was performed following the standard protocol without IV contrast. Intravenous contrast was refused by the patient. RADIATION DOSE REDUCTION: This exam was performed according to the departmental dose-optimization program which includes automated exposure control, adjustment of the mA and/or kV according to patient size and/or use of iterative reconstruction technique. COMPARISON:  None Available. FINDINGS: Cardiovascular: Extensive multi-vessel coronary artery calcification. Global cardiac size is within normal limits. Hypoattenuation of the cardiac blood pool is in keeping with at least mild anemia. No pericardial effusion. Central pulmonary arteries are enlarged in keeping with changes of pulmonary arterial hypertension. The examination is nondiagnostic for the detection of pulmonary embolism given lack of contrast administration. Extensive atherosclerotic calcification within the thoracic aorta and proximal arch vasculature. No aortic aneurysm. Particularly prominent atherosclerotic calcification within the proximal left common carotid artery and left subclavian artery origin may result in hemodynamically significant stenosis, however, this is not well assessed on this non arteriographic study. Mediastinum/Nodes: No enlarged mediastinal or axillary lymph nodes. Thyroid gland, trachea, and esophagus demonstrate no significant findings. Lungs/Pleura: Mild to moderate emphysema. Superimposed interlobular septal thickening and thickening of the peribronchovascular interstitium in keeping with mild interstitial pulmonary edema. Small bilateral pleural effusions are present with associated bibasilar compressive atelectasis. No pneumothorax. No central obstructing lesion. Upper Abdomen: No acute  abnormality. Musculoskeletal: Degenerative changes are seen within the thoracic spine. No acute bone abnormality. No lytic or blastic bone lesion. IMPRESSION: 1. Extensive multi-vessel coronary artery calcification. 2. Morphologic changes in keeping with pulmonary arterial hypertension. 3. Mild interstitial pulmonary edema and small bilateral pleural effusions in keeping with mild cardiogenic failure. 4. 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 5. Extensive atherosclerotic calcification within the proximal left common carotid artery and left subclavian artery origin. This may result in hemodynamically significant stenosis, however, this is not well assessed on this non arteriographic study. If there is clinical evidence of hemodynamically significant stenosis, this could be better assessed with CT arteriography. Aortic Atherosclerosis (ICD10-I70.0) and Emphysema (ICD10-J43.9). Electronically Signed   By: Helyn NumbersAshesh  Parikh M.D.   On: 11/21/2022 22:44   DG Chest Port 1 View  Result Date: 11/21/2022 CLINICAL DATA:  Shortness of breath COVID diagnosis 2 days ago. History of COPD. EXAM: PORTABLE CHEST 1 VIEW COMPARISON:  Radiograph 10/24/2022 FINDINGS: Stable cardiomegaly. Unchanged mediastinal contours with aortic tortuosity and atherosclerosis. Chronic bronchial thickening. Possible small bilateral pleural effusions. Patchy bibasilar opacities. No pneumothorax. IMPRESSION: 1. Patchy bibasilar opacities, may represent atelectasis or pneumonia in the setting of COVID-19. 2. Stable cardiomegaly.  Chronic bronchial thickening. Electronically  Signed   By: Narda Rutherford M.D.   On: 11/21/2022 19:44   PROCEDURES: Critical Care performed: Yes, see critical care procedure note(s) .1-3 Lead EKG Interpretation  Performed by: Merwyn Katos, MD Authorized by: Merwyn Katos, MD     Interpretation: normal     ECG rate:  68   ECG rate assessment: normal     Rhythm: sinus rhythm     Ectopy: none     Conduction: normal   CRITICAL  CARE Performed by: Merwyn Katos  Total critical care time: 35 minutes  Critical care time was exclusive of separately billable procedures and treating other patients.  Critical care was necessary to treat or prevent imminent or life-threatening deterioration.  Critical care was time spent personally by me on the following activities: development of treatment plan with patient and/or surrogate as well as nursing, discussions with consultants, evaluation of patient's response to treatment, examination of patient, obtaining history from patient or surrogate, ordering and performing treatments and interventions, ordering and review of laboratory studies, ordering and review of radiographic studies, pulse oximetry and re-evaluation of patient's condition.  MEDICATIONS ORDERED IN ED: Medications  iohexol (OMNIPAQUE) 350 MG/ML injection 75 mL (has no administration in time range)  pantoprazole (PROTONIX) injection 40 mg (has no administration in time range)  amLODipine (NORVASC) tablet 5 mg (has no administration in time range)  atorvastatin (LIPITOR) tablet 40 mg (has no administration in time range)  carvedilol (COREG) tablet 25 mg (has no administration in time range)  mometasone-formoterol (DULERA) 200-5 MCG/ACT inhaler 2 puff (has no administration in time range)  albuterol (VENTOLIN HFA) 108 (90 Base) MCG/ACT inhaler 2 puff (has no administration in time range)  insulin aspart (novoLOG) injection 0-15 Units (has no administration in time range)  sodium chloride flush (NS) 0.9 % injection 3 mL (has no administration in time range)  0.9 %  sodium chloride infusion (has no administration in time range)  acetaminophen (TYLENOL) tablet 650 mg (has no administration in time range)    Or  acetaminophen (TYLENOL) suppository 650 mg (has no administration in time range)  HYDROcodone-acetaminophen (NORCO/VICODIN) 5-325 MG per tablet 1 tablet (has no administration in time range)  remdesivir 200 mg  in sodium chloride 0.9% 250 mL IVPB (has no administration in time range)    Followed by  remdesivir 100 mg in sodium chloride 0.9 % 100 mL IVPB (has no administration in time range)  ipratropium-albuterol (DUONEB) 0.5-2.5 (3) MG/3ML nebulizer solution 3 mL (3 mLs Nebulization Given 11/21/22 1918)   IMPRESSION / MDM / ASSESSMENT AND PLAN / ED COURSE  I reviewed the triage vital signs and the nursing notes.                             The patient is on the cardiac monitor to evaluate for evidence of arrhythmia and/or significant heart rate changes. Patient's presentation is most consistent with acute presentation with potential threat to life or bodily function. Presentation most consistent with Viral Syndrome.  Patient has tested positive for COVID-19. At this time patient is requiring submental oxygenation due to acute hypoxic respiratory failure.  Given History and Exam I have a lower suspicion for: Emergent CardioPulmonary causes [such as Acute Asthma or COPD Exacerbation, acute Heart Failure or exacerbation, PE, PTX, atypical ACS, PNA]. Emergent Otolaryngeal causes [such as PTA, RPA, [place], Epiglottitis, EBV].  Regarding Emergent Travel or Immunosuppressive related infectious: I have a low suspicion  for acute HIV.  Given radiologic evidence for patchy bilateral airspace opacities concerning for viral pneumonia, continued need for supplemental oxygenation due to acute hypoxic respiratory failure, and need for further evaluation and management, patient will require admission   FINAL CLINICAL IMPRESSION(S) / ED DIAGNOSES   Final diagnoses:  Acute respiratory failure with hypoxia (HCC)  COVID-19 virus infection  Viral pneumonia   Rx / DC Orders   ED Discharge Orders     None      Note:  This document was prepared using Dragon voice recognition software and may include unintentional dictation errors.   Merwyn Katos, MD 11/22/22 [id number]

## 2022-11-21 NOTE — Assessment & Plan Note (Signed)
Lab Results  Component Value Date   CREATININE 0.84 11/21/2022   CREATININE 0.77 11/17/2022   CREATININE 1.00 11/11/2022  Stable. Monitor.  Renally dose and limit contrast.

## 2022-11-21 NOTE — Assessment & Plan Note (Addendum)
Pt is s/p egd showing duodenal ulcer. Pt's hematochezia and melena has resolved and  no longer has black stools.  Type/screen/transfuse if hb drops to 7 or below.

## 2022-11-21 NOTE — Assessment & Plan Note (Signed)
Vitals:   11/21/22 1854 11/21/22 2030 11/21/22 2100 11/21/22 2130  BP:  (!) 152/51 (!) 141/52 (!) 145/49  Pulse:  65 60 66  Resp:  17 16 19   Height: 5\' 2"  (1.575 m)     Weight: 68 kg     SpO2:  (!) 88% 90% 90%  BMI (Calculated): 27.43      Attribute toe covid PNA. We will start pt on Remdesivir therapy.

## 2022-11-21 NOTE — Assessment & Plan Note (Signed)
Patient being closely followed by vascular team with Dr. Wyn Quaker. B/L Iliac stents in 2003, Left SES to SFA via left antegrade stick 08/2017 . Patient recently had left carotid endarterectomy on 11/17/2022 Continue patient on her statin therapy. Holding aspirin as we are giving her a trial with heparin infusion

## 2022-11-21 NOTE — Assessment & Plan Note (Addendum)
CBG mildly elevated. -SSI

## 2022-11-21 NOTE — Assessment & Plan Note (Signed)
Neuro and vascular checks every 4 hours. Vascular surgery consult as deemed appropriate per a.m. team. Statin therapy continued and antiplatelet therapy held secondary to acute blood loss anemia from GI bleed.

## 2022-11-21 NOTE — H&P (Addendum)
History and Physical    Chief Complaint: Shortness of breath   HISTORY OF PRESENT ILLNESS: Kelly Rowland is an 81 y.o. female brought by EMS from [facility] for progressive shortness of breath along with cough and productive sputum. Patient was diagnosed with COVID 2 days ago and was started on oxygen. Pt found to be hypoxic with o2 sats  and 3 L [place]. Pt has been on oxygen at rehab for 3 days. Pt refused CTA in ed for VTE eval.   >> Chart reviewed patient has extensive history of acute blood loss anemia from GI bleed from duodenal ulcer and recent studies as below. >> Patient was discharged on November 17, 2022 from the vascular surgery team after having her left carotid endarterectomy.  Patient was discharged to rehab no neurodeficits.   Ambulating voiding taking p.o. diet. 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   Respiratory:  Positive for cough and shortness of breath.   All other systems reviewed and are negative.  Allergies  Allergen Reactions   Amoxicillin Anaphylaxis   Lisinopril Anaphylaxis   Tramadol Nausea Only   Colchicine Nausea Only   Past Surgical History:  Procedure Laterality Date    ABDOMINAL HYSTERECTOMY     APPENDECTOMY     CAROTID ANGIOGRAPHY Bilateral 11/02/2022   Procedure: CAROTID ANGIOGRAPHY;  Surgeon: Annice Needy, MD;  Location: [facility] INVASIVE CV LAB;  Service: Cardiovascular;  Laterality: Bilateral;   CATARACT EXTRACTION Bilateral 2019   COLONOSCOPY N/A 10/22/2022   Procedure: COLONOSCOPY;  Surgeon: Midge Minium, MD;  Location: [facility] ENDOSCOPY;  Service: Endoscopy;  Laterality: N/A;   ENDARTERECTOMY Left 11/16/2022   Procedure: ENDARTERECTOMY CAROTID;  Surgeon: Annice Needy, MD;  Location: [facility] ORS;  Service: Vascular;  Laterality: Left;   ESOPHAGOGASTRODUODENOSCOPY (EGD) WITH PROPOFOL N/A 10/18/2022   Procedure: ESOPHAGOGASTRODUODENOSCOPY (EGD) WITH PROPOFOL;  Surgeon: Toney Reil, MD;  Location: [facility] ENDOSCOPY;  Service: Gastroenterology;  Laterality: N/A;   ESOPHAGOGASTRODUODENOSCOPY (EGD) WITH PROPOFOL N/A 10/22/2022   Procedure: ESOPHAGOGASTRODUODENOSCOPY (EGD) WITH PROPOFOL;  Surgeon: Midge Minium, MD;  Location: [facility] ENDOSCOPY;  Service: Endoscopy;  Laterality: N/A;   ESOPHAGOGASTRODUODENOSCOPY (EGD) WITH PROPOFOL N/A 11/04/2022   Procedure: ESOPHAGOGASTRODUODENOSCOPY (EGD) WITH PROPOFOL;  Surgeon: Toney Reil, MD;  Location: [facility] ENDOSCOPY;  Service: Gastroenterology;  Laterality: N/A;   EYE SURGERY     TONSILLECTOMY        MEDICATIONS: Current Outpatient Medications  Medication Instructions   amLODipine (NORVASC) 5 mg, Oral, Daily   aspirin EC 81 mg, Oral, Daily   atorvastatin (LIPITOR) 40 mg, Oral, Daily  at bedtime   carvedilol (COREG) 25 mg, Oral, 2 times daily   Cholecalciferol 50 MCG (2000 UT) CAPS 1 capsule, Oral, Daily   fluticasone-salmeterol (ADVAIR) 250-50 MCG/ACT AEPB 1 puff, Inhalation, 2 times daily   folic acid (FOLVITE) 400 MCG tablet 1 tablet, Oral, Daily   furosemide (LASIX) 20 mg, Oral, Daily   gabapentin (NEURONTIN) 100 mg, Oral, Daily at bedtime, Patients stats her orthopedic just prescribed this medication this  past Thursday. She takes is every night at 0800PM   insulin glargine-yfgn (SEMGLEE) 12 Units, Subcutaneous, Daily   KLOR-CON M20 20 MEQ tablet 20 mEq, Oral, Daily   lidocaine (LIDODERM) 5 % 1 patch, Transdermal, Every 24 hours, Remove & Discard patch within 12 hours or as directed by MD   Multiple Vitamin (DAILY VITE PO) 1 tablet, Oral, Daily   oxyCODONE-acetaminophen (PERCOCET/ROXICET) 5-325 MG tablet 1-2 tablets, Oral, Every 6 hours PRN   pantoprazole (PROTONIX) 40 mg, Oral, 2 times daily   senna (SENOKOT) 8.6 mg, Oral, 2 times daily   VENTOLIN HFA 108 (90 Base) MCG/ACT inhaler 2 puffs, Inhalation, Every 4 hours PRN   vitamin B-12 (CYANOCOBALAMIN) 500 MCG tablet 1 tablet, Oral, Daily     [START ON 11/22/2022] amLODipine  5 mg Oral Daily   atorvastatin  40 mg Oral QHS   carvedilol  25 mg Oral BID   [START ON 11/22/2022] insulin aspart  0-15 Units Subcutaneous TID WC   mometasone-formoterol  2 puff Inhalation BID   pantoprazole (PROTONIX) IV  40 mg Intravenous Q12H   sodium chloride flush  3 mL Intravenous Q12H     sodium chloride      acetaminophen **OR** acetaminophen, albuterol, HYDROcodone-acetaminophen, iohexol    ED Course: Pt in Ed is somnolent oriented, follows commands.   Vitals:   11/21/22 1854 11/21/22 2030 11/21/22 2100 11/21/22 2130  BP:  (!) 152/51 (!) 141/52 (!) 145/49  Pulse:  65 60 66  Resp:  17 16 19   SpO2:  (!) 88% 90% 90%  Weight: 68 kg     Height: 5\' 2"  (1.575 m)       No intake/output data recorded. SpO2: 90 % O2 Flow Rate (L/min): 3 L/min Blood work in ed shows: Hypoxia, anemia, electrolyte abnormalities, normal kidney function normal liver function. Results for orders placed or performed during the hospital encounter of 11/21/22 (from the past 24 hour(s))  Blood gas, venous     Status: Abnormal   Collection Time: 11/21/22  7:02 PM  Result Value Ref Range   pH, Ven 7.45 (H) 7.25 - 7.43   pCO2, Ven 44 44 - 60 mmHg   pO2, Ven 66 (H) 32 - 45 mmHg    Bicarbonate 30.6 (H) 20.0 - 28.0 mmol/L   Acid-Base Excess 5.8 (H) 0.0 - 2.0 mmol/L   O2 Saturation 94.9 %   Patient temperature 37.0    Collection site VEIN   Comprehensive metabolic panel     Status: Abnormal   Collection Time: 11/21/22  7:03 PM  Result Value Ref Range   Sodium 134 (L) 135 - 145 mmol/L   Potassium 3.4 (L) 3.5 - 5.1 mmol/L   Chloride 98 98 - 111 mmol/L   CO2 28 22 - 32 mmol/L   Glucose, Bld 227 (H) 70 - 99 mg/dL   BUN 13 8 - 23 mg/dL   Creatinine, Ser 8.290.84 0.44 - 1.00 mg/dL   Calcium 8.0 (L) 8.9 - 10.3 mg/dL   Total Protein 5.2 (L) 6.5 - 8.1 g/dL  Albumin 2.3 (L) 3.5 - 5.0 g/dL   AST 16 15 - 41 U/L   ALT 13 0 - 44 U/L   Alkaline Phosphatase 80 38 - 126 U/L   Total Bilirubin 0.6 0.3 - 1.2 mg/dL   GFR, Estimated >05 >39 mL/min   Anion gap 8 5 - 15  Troponin I (High Sensitivity)     Status: None   Collection Time: 11/21/22  7:03 PM  Result Value Ref Range   Troponin I (High Sensitivity) 9 <18 ng/L  CBC with Differential     Status: Abnormal   Collection Time: 11/21/22  7:03 PM  Result Value Ref Range   WBC 4.6 4.0 - 10.5 K/uL   RBC 2.76 (L) 3.87 - 5.11 MIL/uL   Hemoglobin 7.6 (L) 12.0 - 15.0 g/dL   HCT 76.7 (L) 34.1 - 93.7 %   MCV 89.1 80.0 - 100.0 fL   MCH 27.5 26.0 - 34.0 pg   MCHC 30.9 30.0 - 36.0 g/dL   RDW 90.2 (H) 40.9 - 73.5 %   Platelets 236 150 - 400 K/uL   nRBC 0.0 0.0 - 0.2 %   Neutrophils Relative % 74 %   Neutro Abs 3.4 1.7 - 7.7 K/uL   Lymphocytes Relative 13 %   Lymphs Abs 0.6 (L) 0.7 - 4.0 K/uL   Monocytes Relative 11 %   Monocytes Absolute 0.5 0.1 - 1.0 K/uL   Eosinophils Relative 1 %   Eosinophils Absolute 0.0 0.0 - 0.5 K/uL   Basophils Relative 0 %   Basophils Absolute 0.0 0.0 - 0.1 K/uL   Immature Granulocytes 1 %   Abs Immature Granulocytes 0.03 0.00 - 0.07 K/uL  Brain natriuretic peptide     Status: Abnormal   Collection Time: 11/21/22  7:03 PM  Result Value Ref Range   B Natriuretic Peptide 445.8 (H) 0.0 - 100.0 pg/mL   Resp panel by RT-PCR (RSV, Flu A&B, Covid) Anterior Nasal Swab     Status: Abnormal   Collection Time: 11/21/22  7:17 PM   Specimen: Anterior Nasal Swab  Result Value Ref Range   SARS Coronavirus 2 by RT PCR POSITIVE (A) NEGATIVE   Influenza A by PCR NEGATIVE NEGATIVE   Influenza B by PCR NEGATIVE NEGATIVE   Resp Syncytial Virus by PCR NEGATIVE NEGATIVE    Unresulted Labs (From admission, onward)     Start     Ordered   11/22/22 0500  Comprehensive metabolic panel  Tomorrow morning,   STAT        11/21/22 2308   11/22/22 0500  CBC  Tomorrow morning,   STAT        11/21/22 2308   11/21/22 2236  Type and screen  Once,   STAT        11/21/22 2236           Pt has received : Orders Placed This Encounter  Procedures   Resp panel by RT-PCR (RSV, Flu A&B, Covid) Anterior Nasal Swab    Standing Status:   Standing    Number of Occurrences:   1   DG Chest Port 1 View    Standing Status:   Standing    Number of Occurrences:   1    Order Specific Question:   Reason for Exam (SYMPTOM  OR DIAGNOSIS REQUIRED)    Answer:   SHOB, hx of recent covid dx 2 days ago, hx of COPD   CT CHEST WO CONTRAST    Standing Status:  Standing    Number of Occurrences:   1   Blood gas, venous    Standing Status:   Standing    Number of Occurrences:   1   Comprehensive metabolic panel    Standing Status:   Standing    Number of Occurrences:   1   CBC with Differential    Standing Status:   Standing    Number of Occurrences:   1   Brain natriuretic peptide    Standing Status:   Standing    Number of Occurrences:   1   Consult to hospitalist    Standing Status:   Standing    Number of Occurrences:   1    Order Specific Question:   Place call to:    Answer:   [id number]    Order Specific Question:   Reason for Consult    Answer:   Admit   EKG 12-Lead    Standing Status:   Standing    Number of Occurrences:   1   Type and screen    Standing Status:   Standing    Number of Occurrences:   1     Meds ordered this encounter  Medications   ipratropium-albuterol (DUONEB) 0.5-2.5 (3) MG/3ML nebulizer solution 3 mL   iohexol (OMNIPAQUE) 350 MG/ML injection 75 mL   pantoprazole (PROTONIX) injection 40 mg   amLODipine (NORVASC) tablet 5 mg   atorvastatin (LIPITOR) tablet 40 mg   carvedilol (COREG) tablet 25 mg   mometasone-formoterol (DULERA) 200-5 MCG/ACT inhaler 2 puff   albuterol (VENTOLIN HFA) 108 (90 Base) MCG/ACT inhaler 2 puff   insulin aspart (novoLOG) injection 0-15 Units    Order Specific Question:   Correction coverage:    Answer:   Moderate (average weight, post-op)    Order Specific Question:   CBG < 70:    Answer:   implement hypoglycemia protocol    Order Specific Question:   CBG 70 - 120:    Answer:   0 units    Order Specific Question:   CBG 121 - 150:    Answer:   2 units    Order Specific Question:   CBG 151 - 200:    Answer:   3 units    Order Specific Question:   CBG 201 - 250:    Answer:   5 units    Order Specific Question:   CBG 251 - 300:    Answer:   8 units    Order Specific Question:   CBG 301 - 350:    Answer:   11 units    Order Specific Question:   CBG 351 - 400:    Answer:   15 units    Order Specific Question:   CBG > 400    Answer:   call MD and obtain STAT lab verification   sodium chloride flush (NS) 0.9 % injection 3 mL   0.9 %  sodium chloride infusion   OR Linked Order Group    acetaminophen (TYLENOL) tablet 650 mg    acetaminophen (TYLENOL) suppository 650 mg   HYDROcodone-acetaminophen (NORCO/VICODIN) 5-325 MG per tablet 1 tablet     Admission Imaging : DG Chest Port 1 View  Result Date: 11/21/2022 CLINICAL DATA:  Shortness of breath COVID diagnosis 2 days ago. History of COPD. EXAM: PORTABLE CHEST 1 VIEW COMPARISON:  Radiograph 10/24/2022 FINDINGS: Stable cardiomegaly. Unchanged mediastinal contours with aortic tortuosity and atherosclerosis. Chronic bronchial thickening. Possible small bilateral pleural effusions. Patchy  bibasilar opacities. No pneumothorax. IMPRESSION: 1. Patchy bibasilar opacities, may represent atelectasis or pneumonia in the setting of COVID-19. 2. Stable cardiomegaly.  Chronic bronchial thickening. Electronically Signed   By: Narda Rutherford M.D.   On: 11/21/2022 19:44   > EGD 11/04/2022: - Oozing duodenal ulcer with oozing hemorrhage ([place] Class Ib). Treated with bipolar cautery. - A single non-bleeding angioectasia in the stomach. Treated with argon plasma coagulation (APC). - White nummular lesions in esophageal mucosa. Cells for cytology obtained.  >Colonoscopy 11/04/2022: The perianal and digital rectal examinations were normal. Findings: Non-bleeding internal hemorrhoids were found during retroflexion. The hemorrhoids were Grade II (internal hemorrhoids that prolapse but reduce spontaneously). -   Physical Examination: Vitals:   11/21/22 1854 11/21/22 2030 11/21/22 2100 11/21/22 2130  BP:  (!) 152/51 (!) 141/52 (!) 145/49  Pulse:  65 60 66  Resp:  Height:  (1.575 m)     Weight: 68 kg     SpO2:  (!) 88% 90% 90%  BMI (Calculated): 27.43      Physical Exam Vitals and nursing note reviewed.  Constitutional:      General: She is not in acute distress.    Appearance: Normal appearance. She is not ill-appearing, toxic-appearing or diaphoretic.  HENT:     Head: Normocephalic and atraumatic.     Right Ear: Hearing and external ear normal.     Left Ear: Hearing and external ear normal.     Nose: Nose normal. No nasal deformity.     Mouth/Throat:     Lips: Pink.     Mouth: Mucous membranes are moist.     Tongue: No lesions.     Pharynx: Oropharynx is clear.  Eyes:     Extraocular Movements: Extraocular movements intact.     Pupils: Pupils are equal, round, and reactive to light.  Cardiovascular:     Rate and Rhythm: Normal rate and regular rhythm.     Pulses: Normal pulses.     Heart sounds: Normal heart sounds.  Pulmonary:     Effort: Pulmonary  effort is normal.     Breath sounds: Examination of the right-middle field reveals wheezing. Examination of the left-middle field reveals wheezing. Examination of the right-lower field reveals wheezing. Examination of the left-lower field reveals wheezing. Wheezing present.  Abdominal:     General: Bowel sounds are normal. There is no distension.     Palpations: Abdomen is soft. There is no mass.     Tenderness: There is no abdominal tenderness. There is no guarding.     Hernia: No hernia is present.  Musculoskeletal:     Right lower leg: No edema.     Left lower leg: No edema.  Skin:    General: Skin is warm.  Neurological:     General: No focal deficit present.     Mental Status: She is alert and oriented to person, place, and time.     Cranial Nerves: Cranial nerves 2-12 are intact.     Motor: Motor function is intact.  Psychiatric:        Attention and Perception: Attention normal.        Mood and Affect: Mood normal.        Speech: Speech normal.        Behavior: Behavior normal. Behavior is cooperative.        Cognition and Memory: Cognition normal.     Assessment and Plan: Chronic kidney disease, stage 3b (HCC) Lab Results  Component Value Date   CREATININE 0.84 11/21/2022   CREATININE 0.77 11/17/2022   CREATININE 1.00 11/11/2022  Stable. Monitor.  Renally dose and limit contrast.   Acute blood loss anemia    Latest Ref Rng & Units 11/21/2022    7:03 PM 11/17/2022    4:59 AM 11/11/2022   10:29 AM  CBC  WBC 4.0 - 10.5 K/uL 4.6  8.9    Hemoglobin 12.0 - 15.0 g/dL 7.6  7.3  9.5   Hematocrit 36.0 - 46.0 % 24.6  25.0  28.0   Platelets 150 - 400 K/uL 236  174    Attribute to GIB.  IV PPI. Type/ screen/ transfuse 1 unit.  Occult. GI Consult per am team.    Duodenal ulcer with hemorrhage Gi consult per AM team for ABLA from duodenal ulcer.  Type/scree/transfuse.    Restless legs Secondary to combination of iron deficiency anemia, degenerative disc disease  and arthritis, PAD.  Peripheral arterial disease (HCC) Patient being closely followed by vascular team with Dr. Wyn Quaker. B/L Iliac stents in 2003, Left SES to SFA via left antegrade stick 08/2017 . Continue patient on her statin therapy.  And Coreg.  Lasix and aspirin currently held secondary to her anemia.  Diabetes mellitus (HCC) Glycemic protocol. NPO.   Coronary artery disease Cont statin therapy. Hold  antiplatelet therapy.   Essential hypertension Vitals:   11/21/22 1844 11/21/22 1845 11/21/22 2030 11/21/22 2100  BP: 138/64 138/64 (!) 152/51 (!) 141/52   11/21/22 2130  BP: (!) 145/49  Stable cont BB therapy. Hold diuretic therapy.      Heart failure, unspecified (HCC) 10/2019 three 2D echocardiogram: Left ventricular ejection fraction, by estimation, is 55 to 60%. The left ventricle has normal function. The left ventricle has no regional wall motion abnormalities. There is mild left ventricular hypertrophy. Left ventricular diastolic parameters were normal. Right ventricular systolic function is normal. The right ventricular size is normal. Tricuspid regurgitation signal is inadequate for assessing PA pressure. Left atrial size was mildly dilated. The mitral valve is normal in structure. Mild mitral valve regurgitation. No evidence of mitral stenosis. The aortic valve is normal in structure. Aortic valve regurgitation is not visualized. Aortic valve sclerosis is present, with no evidence of aortic valve stenosis. The inferior vena cava is dilated in size with >50% respiratory variability, suggesting right atrial pressure of 8 mmHg.  Currently continue patient on Coreg. Blood pressure (!) 145/49, pulse 66, resp. rate 19, height  (1.575 m), weight 68 kg, SpO2 90 %.   Intermittent claudication (HCC) Neuro and vascular checks every 4 hours. Vascular surgery consult as deemed appropriate per a.m. team. Statin therapy continued and antiplatelet therapy held secondary  to acute blood loss anemia from GI bleed.     DVT prophylaxis:  SCD's   Code Status:  Full Code    Family Communication:  Maelin, Kurkowski (Son)  [phone]   Disposition Plan:  Home   Consults called:  None  Admission status: Inpatient   Unit/ Expected LOS: Stepdown    Gertha Calkin MD [facility]  6 PM- 2 AM. Please contact me via secure Chat 6 PM-2 AM. [phone]( Pager ) To contact the [facility] Attending or Consulting provider 7A - 7P or covering provider during after hours 7P -7A, for this patient.   Check the care team in [facility] and look for a) attending/consulting [facility] provider listed and b) the [facility] team listed Log into [email or web address] and use [place]'s universal password to access. If  you do not have the password, please contact the hospital operator. Locate the [facility] provider you are looking for under [facility] and page to a number that you can be directly reached. If you still have difficulty reaching the provider, please page the [facility] (Director on Call) for the Hospitalists listed on amion for assistance. [email or web address] 11/21/2022, 11:08 PM

## 2022-11-21 NOTE — Assessment & Plan Note (Addendum)
Most recent echo done on 10/19/2022 within normal limit. Chest x-ray with concern of mild interstitial edema. Significant right lower extremity edema which can be due to DVT -Giving 1 dose of IV Lasix

## 2022-11-21 NOTE — Assessment & Plan Note (Addendum)
No chest pain. Cont statin therapy. Hold  antiplatelet therapy.

## 2022-11-22 ENCOUNTER — Inpatient Hospital Stay: Payer: Medicare Other

## 2022-11-22 DIAGNOSIS — J9601 Acute respiratory failure with hypoxia: Secondary | ICD-10-CM

## 2022-11-22 DIAGNOSIS — I82439 Acute embolism and thrombosis of unspecified popliteal vein: Secondary | ICD-10-CM

## 2022-11-22 DIAGNOSIS — U071 COVID-19: Secondary | ICD-10-CM | POA: Diagnosis present

## 2022-11-22 LAB — COMPREHENSIVE METABOLIC PANEL
ALT: 12 U/L (ref 0–44)
AST: 16 U/L (ref 15–41)
Albumin: 2.3 g/dL — ABNORMAL LOW (ref 3.5–5.0)
Alkaline Phosphatase: 82 U/L (ref 38–126)
Anion gap: 6 (ref 5–15)
BUN: 9 mg/dL (ref 8–23)
CO2: 31 mmol/L (ref 22–32)
Calcium: 8.1 mg/dL — ABNORMAL LOW (ref 8.9–10.3)
Chloride: 99 mmol/L (ref 98–111)
Creatinine, Ser: 0.67 mg/dL (ref 0.44–1.00)
GFR, Estimated: >60 mL/min (ref >60)
Glucose, Bld: 209 mg/dL — ABNORMAL HIGH (ref 70–99)
Potassium: 3.3 mmol/L — ABNORMAL LOW (ref 3.5–5.1)
Sodium: 136 mmol/L (ref 135–145)
Total Bilirubin: 0.5 mg/dL (ref 0.3–1.2)
Total Protein: 5.5 g/dL — ABNORMAL LOW (ref 6.5–8.1)

## 2022-11-22 LAB — PROCALCITONIN: Procalcitonin: <0.1 ng/mL

## 2022-11-22 LAB — CBC
HCT: 27.1 % — ABNORMAL LOW (ref 36.0–46.0)
Hemoglobin: 8.3 g/dL — ABNORMAL LOW (ref 12.0–15.0)
MCH: 27.4 pg (ref 26.0–34.0)
MCHC: 30.6 g/dL (ref 30.0–36.0)
MCV: 89.4 fL (ref 80.0–100.0)
Platelets: 241 10*3/uL (ref 150–400)
RBC: 3.03 MIL/uL — ABNORMAL LOW (ref 3.87–5.11)
RDW: 15.5 % (ref 11.5–15.5)
WBC: 4.5 10*3/uL (ref 4.0–10.5)
nRBC: 0 % (ref 0.0–0.2)

## 2022-11-22 LAB — CBG MONITORING, ED
Glucose-Capillary: 221 mg/dL — ABNORMAL HIGH (ref 70–99)
Glucose-Capillary: 159 mg/dL — ABNORMAL HIGH (ref 70–99)
Glucose-Capillary: 206 mg/dL — ABNORMAL HIGH (ref 70–99)
Glucose-Capillary: 195 mg/dL — ABNORMAL HIGH (ref 70–99)

## 2022-11-22 LAB — TYPE AND SCREEN
ABO/RH(D): B NEG
Antibody Screen: NEGATIVE

## 2022-11-22 LAB — APTT: aPTT: 30 seconds (ref 24–36)

## 2022-11-22 LAB — PROTIME-INR
INR: 1.1 (ref 0.8–1.2)
Prothrombin Time: 13.8 seconds (ref 11.4–15.2)

## 2022-11-22 MED ORDER — GUAIFENESIN-DM 100-10 MG/5ML PO SYRP
5.0000 mL | ORAL_SOLUTION | ORAL | Status: DC | PRN
  Administered 2022-11-22 – 2022-11-25 (×3): 5 mL via ORAL
  Filled 2022-11-22 (×3): qty 10

## 2022-11-22 MED ORDER — FUROSEMIDE 10 MG/ML IJ SOLN
40.0000 mg | Freq: Once | INTRAMUSCULAR | Status: AC
  Administered 2022-11-22: 40 mg via INTRAVENOUS
  Filled 2022-11-22: qty 4

## 2022-11-22 MED ORDER — IPRATROPIUM-ALBUTEROL 0.5-2.5 (3) MG/3ML IN SOLN
3.0000 mL | RESPIRATORY_TRACT | Status: DC | PRN
  Administered 2022-12-01: 3 mL via RESPIRATORY_TRACT
  Filled 2022-11-22: qty 3

## 2022-11-22 MED ORDER — PREDNISONE 20 MG PO TABS
60.0000 mg | ORAL_TABLET | Freq: Every day | ORAL | Status: DC
  Administered 2022-11-22 – 2022-11-24 (×3): 60 mg via ORAL
  Filled 2022-11-22 (×3): qty 3

## 2022-11-22 MED ORDER — OXYCODONE-ACETAMINOPHEN 5-325 MG PO TABS
1.0000 | ORAL_TABLET | Freq: Once | ORAL | Status: AC
  Administered 2022-11-23: 1 via ORAL
  Filled 2022-11-22: qty 1

## 2022-11-22 MED ORDER — HEPARIN (PORCINE) 25000 UT/250ML-% IV SOLN
850.0000 [IU]/h | INTRAVENOUS | Status: DC
  Administered 2022-11-22: 1000 [IU]/h via INTRAVENOUS
  Administered 2022-11-25: 850 [IU]/h via INTRAVENOUS
  Filled 2022-11-22 (×4): qty 250

## 2022-11-22 MED ORDER — MENTHOL 3 MG MT LOZG: 1.0000 | LOZENGE | OROMUCOSAL | Status: DC | PRN

## 2022-11-22 NOTE — ED Notes (Signed)
Pt given lunch tray at this time

## 2022-11-22 NOTE — Assessment & Plan Note (Signed)
Renal function seems stable. -Monitor renal function

## 2022-11-22 NOTE — ED Notes (Signed)
Report given to Crystal RN 

## 2022-11-22 NOTE — ED Notes (Signed)
Patient states her breathing has improved since arrival. Purewick in place and attached to suction.

## 2022-11-22 NOTE — ED Notes (Signed)
This tech to pt rm due to call light being on. This tech also brought pt breakfast tray due to pt being on COVID precautions and dietary will not bring in the tray due to precautions. Pt asks this tech "is anyone at the desk" this tech ask pt to clarify her question, pt states "is anyone at the desk that has this room?" This tech advises pt that her primary RN does have other pts that she has to attend to. Pt states "yeah but I hit the call bell like 10 minutes ago" this tech offered apologies for how long it took for someone to attend to her. Breakfast tray was set up on table in front of patient. No other needs at this time.

## 2022-11-22 NOTE — ED Notes (Addendum)
Pt requesting double dose of Norco stating that she takes 2 at home. She is an 8/10 back and legs chronic pain ache right now. She said she does not want to try anything else. NP F[place] notified.

## 2022-11-22 NOTE — ED Notes (Signed)
NP at bedside assessing pt

## 2022-11-22 NOTE — Hospital Course (Addendum)
Taken from prior notes.  Kelly Rowland is a 81 y.o. 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  Patient has been wearing 3 L oxygen by nasal cannula over the last 3 days due to the shortness of breath.  Patient states that she has never needed oxygen before her admission to the hospital approximately 1 week prior to arrival.  Of note, patient was diagnosed with COVID 2 days prior to arrival.  Per chart review patient has extensive history of acute blood loss anemia secondary to GI bleed from duodenal ulcers.  She also has left carotid endarterectomy recently on 11/17/2022 with vascular surgery and after the procedure she was discharged back to rehab.  Patient refused CTA for VTE evaluation, stating that she does not want any contrast as it can hurt her kidneys.  ED course.  On arrival to ED patient was hypoxic and transiently required nonrebreather.  Labs pertinent for hemoglobin of 7.6 which improved to 8.3 on repeat, no blood transfusion given.  Also found to have hyperglycemia and mild hypokalemia at 3.3 which was thought to be secondary to HCTZ use.  Procalcitonin negative.  COVID PCR positive CT chest without contrast with extensive atherosclerotic disease, mild interstitial pulmonary edema and small bilateral pleural effusion.  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 She was started on remdesivir and steroid.  12/10: Patient was weaned back to 3 L of oxygen.  Significant right lower extremity edema with no calf tenderness.  Venous Doppler studies were obtained and they shows DVT in right popliteal vein.  Will start her on heparin infusion, if remained stable with no more GI bleed then we can transition to Eliquis.  Need to monitor hemoglobin very closely. Anticoagulating her might be a challenge.  Vascular surgery was also consulted to see if she is a candidate for IVC  filter placement. Also giving 1 dose of IV Lasix

## 2022-11-22 NOTE — ED Notes (Addendum)
Pt having coughing  spells, drops her oxygen to 86-88% while in HFNC, swtiched back to non-rebreather as indicated by NP Jon Billings. NP notified, awaiting new orders.Marland Kitchen

## 2022-11-22 NOTE — Assessment & Plan Note (Addendum)
Patient with significant right leg swelling and venous Doppler studies positive for DVT in popliteal vein.  Patient high risk for GI bleed due to her history of peptic ulcer disease, most recent a month ago. Refusing CTA as she does not want contrast, renal functions normal. -Starting her on heparin infusion -Vascular surgery consult to see if she is a candidate for IVC filter placement -Continue to monitor

## 2022-11-22 NOTE — Progress Notes (Signed)
       CROSS COVER NOTE  NAME: Kelly Rowland MRN: [id number] DOB : 07-Jun-1941    HPI/Events of Note   Nurse reports decrease oxygen saturation and placed on NRB.  Patient with dx of covid 3 days ago and requiring 3L [place]. She has also had recent blood loss anemia from GI bleed and carotic endarterectomy on 12/5  Assessment and  Interventions   Assessment: General - Pale. Short shallow breathing CV - BP slightly hypertensive, carotid no bruit, no thrill. Left carotid well approximated.  Pulm - reports sore throat airway patent, no stridor  Plan: Add steroids Breathing treatments Transfuse 1 units PRBC - recent ABLA and obvious recent drop in baseline hgb. Given CV procedure goal of HGB should be 8. Lasix 40 post transfusion      Donnie Mesa NP [facility]

## 2022-11-22 NOTE — ED Notes (Signed)
Pt rang call bell, reports she is having a hard time breathing Oxygen saturation 89% on 5L/[place]. RN attempted to reposition pt in bed, laid head of bed flat and pt's oxygen saturation drop to 80%, sat pt on bed put pt on a non-rebreathe and oxygen saturation improved to 100%.

## 2022-11-22 NOTE — ED Notes (Signed)
Pt was given warm blanket per request.

## 2022-11-22 NOTE — Assessment & Plan Note (Signed)
Patient was tested +2 days ago at her facility, repeat COVID PCR remained positive.  Due to acute hypoxic respiratory failure she was started on remdesivir and prednisone. -Continue with remdesivir -Continue with prednisone -Continue with supportive care -Continue supplemental oxygen

## 2022-11-22 NOTE — Progress Notes (Signed)
ANTICOAGULATION CONSULT NOTE - Initial Consult  Pharmacy Consult for IV heparin  Indication: VTE Treatment  Allergies  Allergen Reactions   Amoxicillin Anaphylaxis   Lisinopril Anaphylaxis   Tramadol Nausea Only   Colchicine Nausea Only    Patient Measurements: Height: 5\' 2"  (157.5 cm) Weight: 68 kg (150 lb) IBW/kg (Calculated) : 50.1 Heparin Dosing Weight: 68 kg  Vital Signs: Temp: 98.4 F (36.9 C) (12/10 1146) Temp Source: Oral (12/10 1146) BP: 167/87 (12/10 1500) Pulse Rate: 65 (12/10 1500)  Labs: Recent Labs    11/21/22 1903 11/22/22 0143  HGB 7.6* 8.3*  HCT 24.6* 27.1*  PLT 236 241  CREATININE 0.84 0.67  TROPONINIHS 9  --     Estimated Creatinine Clearance: 51.6 mL/min (by C-G formula based on SCr of 0.67 mg/dL).   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 PMH includes extensive history of acute blood loss anemia from GI bleed/ulcer (GI bleed 11/04/2022). Venous ultrasound showing DVT in right peroneal vein. Also with respiratory distress on 12/10, though patient is refusing CTA. Pharmacy has been consulted to manage heparin for new DVT / possible PE.  Hgb 7-8 at baseline (though unclear if GI oozing/hemorrhage contributory to low baseline Hgb). aPTT and PT-INR in process  Goal of Therapy:  Heparin level  0.3-0.7 units/ml Monitor platelets by anticoagulation protocol: Yes **No bolus protocol**   Plan: No bolus. Start heparin infusion at 1,000 units/hr (low end of dosing range) 8 hour HL. No boluses due to extensive GI bleed history / low hemoglobin present this admission. CBC daily. Monitor H&H closely.   14/10, PharmD, BCPS Clinical Pharmacist  11/22/2022 3:18 PM

## 2022-11-22 NOTE — Progress Notes (Signed)
       CROSS COVER NOTE  NAME: Kelly Rowland MRN: [id number] DOB : 1941-08-06 ATTENDING PHYSICIAN: Arnetha Courser, MD    Date of Service   11/22/2022   HPI/Events of Note   Norco not helping Wants Percocet x2 PDMP Checked, appears get 5-325 q4H or 10 q8H  Interventions   Assessment/Plan: Percocet 5-325mg x1 X X      This document was prepared using Dragon voice recognition software and may include unintentional dictation errors.  Bishop Limbo DNP, MBA, FNP-BC Nurse Practitioner [facility] Pager [phone]

## 2022-11-22 NOTE — Progress Notes (Signed)
Progress Note   Patient: Kelly Rowland QQP:[id number] DOB: Jun 04, 1941 DOA: 11/21/2022     1 DOS: the patient was seen and examined on 11/22/2022   Brief hospital course: Taken from prior notes.  Devynn Hessler is a 81 y.o. 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  Patient has been wearing 3 L oxygen by nasal cannula over the last 3 days due to the shortness of breath.  Patient states that she has never needed oxygen before her admission to the hospital approximately 1 week prior to arrival.  Of note, patient was diagnosed with COVID 2 days prior to arrival.  Per chart review patient has extensive history of acute blood loss anemia secondary to GI bleed from duodenal ulcers.  She also has left carotid endarterectomy recently on 11/17/2022 with vascular surgery and after the procedure she was discharged back to rehab.  Patient refused CTA for VTE evaluation, stating that she does not want any contrast as it can hurt her kidneys.  ED course.  On arrival to ED patient was hypoxic and transiently required nonrebreather.  Labs pertinent for hemoglobin of 7.6 which improved to 8.3 on repeat, no blood transfusion given.  Also found to have hyperglycemia and mild hypokalemia at 3.3 which was thought to be secondary to HCTZ use.  Procalcitonin negative.  COVID PCR positive CT chest without contrast with extensive atherosclerotic disease, mild interstitial pulmonary edema and small bilateral pleural effusion.  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 She was started on remdesivir and steroid.  12/10: Patient was weaned back to 3 L of oxygen.  Significant right lower extremity edema with no calf tenderness.  Venous Doppler studies were obtained and they shows DVT in right popliteal vein.  Will start her on heparin infusion, if remained stable with no more GI bleed then we can transition  to Eliquis.  Need to monitor hemoglobin very closely. Anticoagulating her might be a challenge.  Vascular surgery was also consulted to see if she is a candidate for IVC filter placement. Also giving 1 dose of IV Lasix     Assessment and Plan: * Acute hypoxic respiratory failure (HCC) Patient with no baseline oxygen use came with hypoxia and shortness of breath.  Required nonrebreather transiently and now on wean back to 3 L. Most likely secondary to COVID-19 infection. Also concern of some interstitial edema. Refusing CTA as she does not want contrast. Lower extremity venous Doppler positive for right popliteal vein DVT, high risk for PE. -Starting her on heparin infusion -Continue with supplemental oxygen-wean as tolerated  COVID-19 virus infection Patient was tested +2 days ago at her facility, repeat COVID PCR remained positive.  Due to acute hypoxic respiratory failure she was started on remdesivir and prednisone. -Continue with remdesivir -Continue with prednisone -Continue with supportive care -Continue supplemental oxygen  DVT of popliteal vein (HCC) Patient with significant right leg swelling and venous Doppler studies positive for DVT in popliteal vein.  Patient high risk for GI bleed due to her history of peptic ulcer disease, most recent a month ago. Refusing CTA as she does not want contrast, renal functions normal. -Starting her on heparin infusion -Vascular surgery consult to see if she is a candidate for IVC filter placement -Continue to monitor  Chronic blood loss anemia Hemoglobin currently stable, with some improvement to 8.3 this morning. History of peptic ulcer disease, most recent admission earlier in November where  she was treated for bleeding duodenal ulcer.  No active bleeding at this time.  During that admission she received 3 unit of PRBC. Patient need anticoagulation for acute DVT and a possible PE. -Monitor hemoglobin closely -Reconsult GI if concern  of any further bleeding   Duodenal ulcer with hemorrhage Pt is s/p egd showing duodenal ulcer. Pt's hematochezia and melena has resolved and  no longer has black stools.  -Monitor closely as patient is being started on heparin infusion  Peripheral arterial disease (HCC) Patient being closely followed by vascular team with Dr. Wyn Quaker. B/L Iliac stents in 2003, Left SES to SFA via left antegrade stick 08/2017 . Patient recently had left carotid endarterectomy on 11/17/2022 Continue patient on her statin therapy. Holding aspirin as we are giving her a trial with heparin infusion  Heart failure, unspecified (HCC) Most recent echo done on 10/19/2022 within normal limit. Chest x-ray with concern of mild interstitial edema. Significant right lower extremity edema which can be due to DVT -Giving 1 dose of IV Lasix  Essential hypertension Blood pressure elevated. -Continue Coreg -Giving 1 dose of IV Lasix -Can resume home diuretic if remains stable   Diabetes mellitus (HCC) CBG mildly elevated. -SSI  Intermittent claudication (HCC) -Being followed up by vascular surgery   Coronary artery disease No chest pain. Cont statin therapy. Hold  antiplatelet therapy.   Chronic kidney disease, stage 3a (HCC) Renal function seems stable. -Monitor renal function  Restless legs Secondary to combination of iron deficiency anemia, degenerative disc disease and arthritis, PAD.   Subjective: Patient was seen and examined today.  Still feeling short of breath, oxygen requirement has been improved, no baseline oxygen use.  Discussed about getting CTA but she continued to refuse stating that she does not want any damage to her kidneys.  She was also very concerned about her recurrent GI bleed.  Physical Exam: Vitals:   11/22/22 1331 11/22/22 1355 11/22/22 1400 11/22/22 1500  BP: (!) 170/73 (!) 168/65 (!) 167/61 (!) 167/87  Pulse: 70 66 65 65  Resp: 20 11 19 16   Temp:      TempSrc:      SpO2:  96% 98% 95% 96%  Weight:      Height:       General.  Frail elderly lady, in no acute distress. Pulmonary.  Few scattered rhonchi bilaterally, normal respiratory effort. CV.  Regular rate and rhythm, no JVD, rub or murmur. Abdomen.  Soft, nontender, nondistended, BS positive. CNS.  Alert and oriented .  No focal neurologic deficit. Extremities.  2+ RLE edema, no cyanosis, pulses intact and symmetrical. Psychiatry.  Judgment and insight appears normal.   Data Reviewed: Prior data reviewed  Family Communication: Discussed with son and DIL on phone.  Disposition: Status is: Inpatient Remains inpatient appropriate because: Severity of illness  Planned Discharge Destination: Skilled nursing facility  DVT prophylaxis.  Heparin infusion Time spent: 50 minutes  This record has been created using . Errors have been sought and corrected,but may not always be located. Such creation errors do not reflect on the standard of care.   Author: Conservation officer, historic buildings, MD 11/22/2022 3:17 PM  For on call review [email or web address].

## 2022-11-22 NOTE — ED Notes (Signed)
RT at bedside changed pt to [facility]NC

## 2022-11-23 DIAGNOSIS — J9601 Acute respiratory failure with hypoxia: Principal | ICD-10-CM | POA: Insufficient documentation

## 2022-11-23 LAB — CBG MONITORING, ED
Glucose-Capillary: 167 mg/dL — ABNORMAL HIGH (ref 70–99)
Glucose-Capillary: 195 mg/dL — ABNORMAL HIGH (ref 70–99)
Glucose-Capillary: 225 mg/dL — ABNORMAL HIGH (ref 70–99)

## 2022-11-23 LAB — HEPARIN LEVEL (UNFRACTIONATED)
Heparin Unfractionated: 0.47 IU/mL (ref 0.30–0.70)
Heparin Unfractionated: 0.63 IU/mL (ref 0.30–0.70)
Heparin Unfractionated: 0.9 IU/mL — ABNORMAL HIGH (ref 0.30–0.70)

## 2022-11-23 MED ORDER — INSULIN GLARGINE-YFGN 100 UNIT/ML ~~LOC~~ SOLN
12.0000 [IU] | Freq: Every day | SUBCUTANEOUS | Status: DC
  Administered 2022-11-23 – 2022-12-01 (×9): 12 [IU] via SUBCUTANEOUS
  Filled 2022-11-23 (×9): qty 0.12

## 2022-11-23 MED ORDER — OXYCODONE-ACETAMINOPHEN 5-325 MG PO TABS
1.0000 | ORAL_TABLET | Freq: Four times a day (QID) | ORAL | Status: DC | PRN
  Administered 2022-11-23: 2 via ORAL
  Administered 2022-11-23 (×2): 1 via ORAL
  Administered 2022-11-23 – 2022-11-24 (×3): 2 via ORAL
  Administered 2022-11-24: 1 via ORAL
  Administered 2022-11-25: 2 via ORAL
  Administered 2022-11-25: 1 via ORAL
  Administered 2022-11-25 – 2022-12-01 (×12): 2 via ORAL
  Filled 2022-11-23: qty 1
  Filled 2022-11-23 (×10): qty 2
  Filled 2022-11-23 (×2): qty 1
  Filled 2022-11-23 (×8): qty 2

## 2022-11-23 NOTE — Progress Notes (Signed)
ANTICOAGULATION CONSULT NOTE - Initial Consult  Pharmacy Consult for IV heparin  Indication: VTE Treatment  Allergies  Allergen Reactions   Amoxicillin Anaphylaxis   Lisinopril Anaphylaxis   Tramadol Nausea Only   Colchicine Nausea Only    Patient Measurements: Height: 5\' 2"  (157.5 cm) Weight: 68 kg (150 lb) IBW/kg (Calculated) : 50.1 Heparin Dosing Weight: 68 kg  Vital Signs: Temp: 98.6 F (37 C) (12/11 1040) Temp Source: Oral (12/11 1040) BP: 158/50 (12/11 1030) Pulse Rate: 63 (12/11 1030)  Labs: Recent Labs    11/21/22 1903 11/22/22 0143 11/22/22 1540 11/23/22 0150 11/23/22 0942  HGB 7.6* 8.3*  --   --   --   HCT 24.6* 27.1*  --   --   --   PLT 236 241  --   --   --   APTT  --   --  30  --   --   LABPROT  --   --  13.8  --   --   INR  --   --  1.1  --   --   HEPARINUNFRC  --   --   --  0.47 0.63  CREATININE 0.84 0.67  --   --   --   TROPONINIHS 9  --   --   --   --      Estimated Creatinine Clearance: 51.6 mL/min (by C-G formula based on SCr of 0.67 mg/dL).   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 PMH includes extensive history of acute blood loss anemia from GI bleed/ulcer (GI bleed 11/04/2022). Venous ultrasound showing DVT in right peroneal vein. Also with respiratory distress on 12/10, though  patient is refusing CTA. Pharmacy has been consulted to manage heparin for new DVT / possible PE.  Hgb 7-8 at baseline (though unclear if GI oozing/hemorrhage contributory to low baseline Hgb). aPTT and PT-INR in process  Goal of Therapy:  Heparin level 0.3-0.7 units/ml Monitor platelets by anticoagulation protocol: Yes **No bolus protocol**   12/11:  HL @ 0150 = 0.47, therapeutic X 1 12/11: HL @ 0942 = 0.63, therapeutic x 2  No boluses due to extensive GI bleed history / low hemoglobin present this admission.  Plan: Continue heparin drip at 1000 units/hr Recheck HL 12/11 @ 1800 CBC daily. Monitor H&H closely.  14/11, PharmD Clinical Pharmacist  11/23/2022 10:44 AM

## 2022-11-23 NOTE — Progress Notes (Signed)
ANTICOAGULATION CONSULT NOTE - Initial Consult  Pharmacy Consult for IV heparin  Indication: VTE Treatment  Allergies  Allergen Reactions   Amoxicillin Anaphylaxis   Lisinopril Anaphylaxis   Tramadol Nausea Only   Colchicine Nausea Only    Patient Measurements: Height: 5\' 2"  (157.5 cm) Weight: 68 kg (150 lb) IBW/kg (Calculated) : 50.1 Heparin Dosing Weight: 68 kg  Vital Signs: Temp: 97.4 F (36.3 C) (12/11 0209) Temp Source: Oral (12/11 0209) BP: 159/56 (12/11 0000) Pulse Rate: 66 (12/11 0000)  Labs: Recent Labs    11/21/22 1903 11/22/22 0143 11/22/22 1540 11/23/22 0150  HGB 7.6* 8.3*  --   --   HCT 24.6* 27.1*  --   --   PLT 236 241  --   --   APTT  --   --  30  --   LABPROT  --   --  13.8  --   INR  --   --  1.1  --   HEPARINUNFRC  --   --   --  0.47  CREATININE 0.84 0.67  --   --   TROPONINIHS 9  --   --   --      Estimated Creatinine Clearance: 51.6 mL/min (by C-G formula based on SCr of 0.67 mg/dL).   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 PMH includes extensive history of acute blood loss anemia from GI bleed/ulcer (GI bleed 11/04/2022). Venous ultrasound showing DVT in right peroneal vein. Also with respiratory distress on 12/10, though patient is refusing CTA. Pharmacy has been consulted to manage  heparin for new DVT / possible PE.  Hgb 7-8 at baseline (though unclear if GI oozing/hemorrhage contributory to low baseline Hgb). aPTT and PT-INR in process  Goal of Therapy:  Heparin level 0.3-0.7 units/ml Monitor platelets by anticoagulation protocol: Yes **No bolus protocol**   Plan: No bolus.   12/11:  HL @ 0150 = 0.47, therapeutic X 1 Will continue pt on current rate and recheck HL in 8 hrs on 12/11 @ 1000.  No boluses due to extensive GI bleed history / low hemoglobin present this admission. CBC daily. Monitor H&H closely.   14/11, PharmD, BCPS Clinical Pharmacist  11/23/2022 2:32 AM

## 2022-11-23 NOTE — Progress Notes (Signed)
PROGRESS NOTE    Kelly Rowland  POE:[id number]  DOB: [birth year]  DOA: 11/21/2022 PCP: Lake Bells, MD Outpatient Specialists:   Hospital course:  61 female with history of CVA, HTN, GI bleed from duodenal ulcers and recent carotid endarterectomy on 11/17/2022 was admitted 11/21/2022 with shortness of breath.  Workup was noted COVID PCR positive, mild interstitial pulmonary edema with small bilateral pleural effusions and moderate emphysema.  Patient has no previous diagnosis of COPD.  Patient declined to have CT angiogram however duplex Dopplers were positive for DVT in right popliteal vein and patient was started on heparin drip.  She was treated with 1 dose of Lasix.  Subjective:  Patient states she very tired, finding it hard to bleed, doesn't understand why she has to be on a blood thinner--discussed implications of DVT and concern for PE.   Objective: Vitals:   11/23/22 1040 11/23/22 1230 11/23/22 1530 11/23/22 1600  BP:  (!) 168/61 (!) 161/58 (!) 162/70  Pulse:  63 64 67  Resp:  15 11 13   Temp: 98.6 F (37 C)     TempSrc: Oral     SpO2:  94% 95% 94%  Weight:      Height:        Intake/Output Summary (Last 24 hours) at 11/23/2022 1708 Last data filed at 11/23/2022 1653 Gross per 24 hour  Intake 340 ml  Output 1900 ml  Net -1560 ml   Filed Weights   11/21/22 1854  Weight: 68 kg     Exam:  General: Chronically ill-appearing female sitting up in bed with normal respiratory effort, tachypnea resolved Eyes: sclera anicteric, conjuctiva mild injection bilaterally CVS: S1-S2, regular  Respiratory:  decreased air entry bilaterally secondary to decreased inspiratory effort, rales at bases  GI: NABS, soft, NT  LE: Warm and well-perfused Neuro: A/O x 3,  grossly nonfocal.  Psych: patient is logical and coherent, judgement and insight appear normal, mood and affect appropriate to situation.  Data Reviewed:  Basic Metabolic Panel: Recent Labs  Lab  11/17/22 0459 11/21/22 1903 11/22/22 0143  NA 136 134* 136  K 4.0 3.4* 3.3*  CL 109 98 99  CO2 24 28 31   GLUCOSE 136* 227* 209*  BUN 11 13 9   CREATININE 0.77 0.84 0.67  CALCIUM 7.8* 8.0* 8.1*    CBC: Recent Labs  Lab 11/17/22 0459 11/21/22 1903 11/22/22 0143  WBC 8.9 4.6 4.5  NEUTROABS  --  3.4  --   HGB 7.3* 7.6* 8.3*  HCT 25.0* 24.6* 27.1*  MCV 92.6 89.1 89.4  PLT 174 236 241     Scheduled Meds:  amLODipine  5 mg Oral Daily   atorvastatin  40 mg Oral QHS   carvedilol  25 mg Oral BID WC   insulin aspart  0-15 Units Subcutaneous TID WC   mometasone-formoterol  2 puff Inhalation BID   pantoprazole (PROTONIX) IV  40 mg Intravenous Q12H   predniSONE  60 mg Oral Q breakfast   sodium chloride flush  3 mL Intravenous Q12H   Continuous Infusions:  heparin 1,000 Units/hr (11/22/22 1657)     Assessment & Plan:   Acute hypoxic respiratory failure  Thought to be secondary to COVID-19 infection and possible PE She is breathing comfortably on O2 via [place]  COVID-19 infection Treatment initiated with remdesivir and prednisone, will continue   DVT/VTE Declined CTA so we do not know if she had a PE or not However she does have a DVT and popliteal vein  Patient is on heparin drip however has history of GI bleed in past--H&H have remained stable since heparin was started  H/o GI bleed in November 2023 Patient was admitted 2 months ago with bleeding all PUD During that admission she received 3 units PRBC We will need to follow H&H very closely as she is now on heparin drip Continue IV pantoprazole  DM2 Continue present management  HFpEF Echocardiogram without any wall motion abnormalities, normal EF She did get 1 dose of Lasix for possible mild interstitial edema Patient is on Lasix 20 mg daily however this is being held, can be restarted if warranted  HTN Continue carvedilol, amlodipine and Lasix  Copied from Dr. Shanda Bumps note Peripheral arterial disease  [facility]) Patient being closely followed by vascular team with Dr. Wyn Quaker. B/L Iliac stents in 2003, Left SES to SFA via left antegrade stick 08/2017 . Patient recently had left carotid endarterectomy on 11/17/2022 Continue patient on her statin therapy. Holding aspirin as we are giving her a trial with heparin infusion    Coronary artery disease No chest pain. Cont statin therapy. Hold  antiplatelet therapy.    Chronic kidney disease, stage 3a (HCC) Renal function seems stable. -Monitor renal function   Restless legs Secondary to combination of iron deficiency anemia, degenerative disc disease and arthritis, PAD.    Studies: US Venous Img Lower Bilateral (DVT)  Result Date: 11/22/2022 CLINICAL DATA:  Lower extremity edema EXAM: BILATERAL LOWER EXTREMITY VENOUS DOPPLER ULTRASOUND TECHNIQUE: Gray-scale sonography with compression, as well as color and duplex ultrasound, were performed to evaluate the deep venous system(s) from the level of the common femoral vein through the popliteal and proximal calf veins. COMPARISON:  None Available. FINDINGS: VENOUS Normal compressibility and Doppler waveforms of the common femoral, superficial femoral, and popliteal veins bilaterally. Occlusive thrombus noted in the right peroneal vein. The left peroneal vein was not well seen due to difficulties related to patient movement. OTHER None. Limitations: Patient movement IMPRESSION: 1. Positive for calf vein DVT in the right peroneal vein. Otherwise negative exam. Electronically Signed   By: Gaylyn Rong M.D.   On: 11/22/2022 13:20   CT CHEST WO CONTRAST  Result Date: 11/21/2022 CLINICAL DATA:  Pulmonary embolism (PE) suspected, high prob. Dyspnea, cough EXAM: CT CHEST WITHOUT CONTRAST TECHNIQUE: Multidetector CT imaging of the chest was performed following the standard protocol without IV contrast. Intravenous contrast was refused by the patient. RADIATION DOSE REDUCTION: This exam was performed according  to the departmental dose-optimization program which includes automated exposure control, adjustment of the mA and/or kV according to patient size and/or use of iterative reconstruction technique. COMPARISON:  None Available. FINDINGS: Cardiovascular: Extensive multi-vessel coronary artery calcification. Global cardiac size is within normal limits. Hypoattenuation of the cardiac blood pool is in keeping with at least mild anemia. No pericardial effusion. Central pulmonary arteries are enlarged in keeping with changes of pulmonary arterial hypertension. The examination is nondiagnostic for the detection of pulmonary embolism given lack of contrast administration. Extensive atherosclerotic calcification within the thoracic aorta and proximal arch vasculature. No aortic aneurysm. Particularly prominent atherosclerotic calcification within the proximal left common carotid artery and left subclavian artery origin may result in hemodynamically significant stenosis, however, this is not well assessed on this non arteriographic study. Mediastinum/Nodes: No enlarged mediastinal or axillary lymph nodes. Thyroid gland, trachea, and esophagus demonstrate no significant findings. Lungs/Pleura: Mild to moderate emphysema. Superimposed interlobular septal thickening and thickening of the peribronchovascular interstitium in keeping with mild interstitial pulmonary edema. Small bilateral  pleural effusions are present with associated bibasilar compressive atelectasis. No pneumothorax. No central obstructing lesion. Upper Abdomen: No acute abnormality. Musculoskeletal: Degenerative changes are seen within the thoracic spine. No acute bone abnormality. No lytic or blastic bone lesion. IMPRESSION: 1. Extensive multi-vessel coronary artery calcification. 2. Morphologic changes in keeping with pulmonary arterial hypertension. 3. Mild interstitial pulmonary edema and small bilateral pleural effusions in keeping with mild cardiogenic  failure. 4. 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 5. Extensive atherosclerotic calcification within the proximal left common carotid artery and left subclavian artery origin. This may result in hemodynamically significant stenosis, however, this is not well assessed on this non arteriographic study. If there is clinical evidence of hemodynamically significant stenosis, this could be better assessed with CT arteriography. Aortic Atherosclerosis (ICD10-I70.0) and Emphysema (ICD10-J43.9). Electronically Signed   By: Helyn Numbers M.D.   On: 11/21/2022 22:44   DG Chest Port 1 View  Result Date: 11/21/2022 CLINICAL DATA:  Shortness of breath COVID diagnosis 2 days ago. History of COPD. EXAM: PORTABLE CHEST 1 VIEW COMPARISON:  Radiograph 10/24/2022 FINDINGS: Stable cardiomegaly. Unchanged mediastinal contours with aortic tortuosity and atherosclerosis. Chronic bronchial thickening. Possible small bilateral pleural effusions. Patchy bibasilar opacities. No pneumothorax. IMPRESSION: 1. Patchy bibasilar opacities, may represent atelectasis or pneumonia in the setting of COVID-19. 2. Stable cardiomegaly.  Chronic bronchial thickening. Electronically Signed   By: Narda Rutherford M.D.   On: 11/21/2022 19:44    Principal Problem:   Acute hypoxic respiratory failure (HCC) Active Problems:   COVID-19 virus infection   DVT of popliteal vein (HCC)   Chronic blood loss anemia   Duodenal ulcer with hemorrhage   Peripheral arterial disease (HCC)   Heart failure, unspecified (HCC)   Essential hypertension   Diabetes mellitus (HCC)   Intermittent claudication (HCC)   Coronary artery disease   Chronic kidney disease, stage 3a (HCC)   Restless legs   Acute respiratory failure with hypoxia (HCC)     Breck Hollinger Orma Flaming, [facility]  If 7PM-7AM, please contact night-coverage [email or web address]   LOS: 2 days

## 2022-11-23 NOTE — Progress Notes (Signed)
ANTICOAGULATION CONSULT NOTE  Pharmacy Consult for IV heparin  Indication: VTE Treatment  Allergies  Allergen Reactions   Amoxicillin Anaphylaxis   Lisinopril Anaphylaxis   Tramadol Nausea Only   Colchicine Nausea Only    Patient Measurements: Height: 5\' 2"  (157.5 cm) Weight: 68 kg (150 lb) IBW/kg (Calculated) : 50.1 Heparin Dosing Weight: 68 kg  Vital Signs: Temp: 98.6 F (37 C) (12/11 1040) Temp Source: Oral (12/11 1040) BP: 168/61 (12/11 1230) Pulse Rate: 63 (12/11 1230)  Labs: Recent Labs    11/21/22 1903 11/22/22 0143 11/22/22 1540 11/23/22 0150 11/23/22 0942  HGB 7.6* 8.3*  --   --   --   HCT 24.6* 27.1*  --   --   --   PLT 236 241  --   --   --   APTT  --   --  30  --   --   LABPROT  --   --  13.8  --   --   INR  --   --  1.1  --   --   HEPARINUNFRC  --   --   --  0.47 0.63  CREATININE 0.84 0.67  --   --   --   TROPONINIHS 9  --   --   --   --      Estimated Creatinine Clearance: 51.6 mL/min (by C-G formula based on SCr of 0.67 mg/dL).   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 PMH includes extensive history of acute blood loss anemia from GI bleed/ulcer (GI bleed 11/04/2022). Venous ultrasound showing DVT in right peroneal vein. Also with respiratory distress on 12/10, though patient is refusing CTA.  Pharmacy has been consulted to manage heparin for new DVT / possible PE.  Hgb 7-8 at baseline (though unclear if GI oozing/hemorrhage contributory to low baseline Hgb). aPTT 30s and INR 1.1  Goal of Therapy:  Heparin level 0.3-0.7 units/ml Monitor platelets by anticoagulation protocol: Yes **No bolus protocol**  No boluses due to extensive GI bleed history / low hemoglobin present this admission.  Plan: heparin level supratherapeutic reduce heparin infusion rate to 850 units/hr Recheck heparin level 8 hours after rate change CBC daily. Monitor H&H closely.  14/10, PharmD Clinical Pharmacist  11/23/2022 2:44 PM

## 2022-11-24 DIAGNOSIS — J9601 Acute respiratory failure with hypoxia: Secondary | ICD-10-CM | POA: Diagnosis not present

## 2022-11-24 LAB — CBC
HCT: 28.6 % — ABNORMAL LOW (ref 36.0–46.0)
Hemoglobin: 8.8 g/dL — ABNORMAL LOW (ref 12.0–15.0)
MCH: 26.7 pg (ref 26.0–34.0)
MCHC: 30.8 g/dL (ref 30.0–36.0)
MCV: 86.7 fL (ref 80.0–100.0)
Platelets: 372 10*3/uL (ref 150–400)
RBC: 3.3 MIL/uL — ABNORMAL LOW (ref 3.87–5.11)
RDW: 15.4 % (ref 11.5–15.5)
WBC: 6.1 10*3/uL (ref 4.0–10.5)
nRBC: 0 % (ref 0.0–0.2)

## 2022-11-24 LAB — BASIC METABOLIC PANEL
Anion gap: 10 (ref 5–15)
BUN: 19 mg/dL (ref 8–23)
CO2: 30 mmol/L (ref 22–32)
Calcium: 8 mg/dL — ABNORMAL LOW (ref 8.9–10.3)
Chloride: 96 mmol/L — ABNORMAL LOW (ref 98–111)
Creatinine, Ser: 0.9 mg/dL (ref 0.44–1.00)
GFR, Estimated: >60 mL/min (ref >60)
Glucose, Bld: 207 mg/dL — ABNORMAL HIGH (ref 70–99)
Potassium: 2.9 mmol/L — ABNORMAL LOW (ref 3.5–5.1)
Sodium: 136 mmol/L (ref 135–145)

## 2022-11-24 LAB — HEPARIN LEVEL (UNFRACTIONATED)
Heparin Unfractionated: 0.64 IU/mL (ref 0.30–0.70)
Heparin Unfractionated: 0.58 IU/mL (ref 0.30–0.70)

## 2022-11-24 LAB — GLUCOSE, CAPILLARY: Glucose-Capillary: 199 mg/dL — ABNORMAL HIGH (ref 70–99)

## 2022-11-24 LAB — CBG MONITORING, ED
Glucose-Capillary: 180 mg/dL — ABNORMAL HIGH (ref 70–99)
Glucose-Capillary: 212 mg/dL — ABNORMAL HIGH (ref 70–99)
Glucose-Capillary: 184 mg/dL — ABNORMAL HIGH (ref 70–99)

## 2022-11-24 MED ORDER — FUROSEMIDE 40 MG PO TABS
40.0000 mg | ORAL_TABLET | Freq: Every day | ORAL | Status: DC
  Administered 2022-11-24 – 2022-12-01 (×8): 40 mg via ORAL
  Filled 2022-11-24 (×8): qty 1

## 2022-11-24 MED ORDER — GABAPENTIN 100 MG PO CAPS
100.0000 mg | ORAL_CAPSULE | Freq: Once | ORAL | Status: AC
  Administered 2022-11-24: 100 mg via ORAL
  Filled 2022-11-24: qty 1

## 2022-11-24 MED ORDER — POTASSIUM CHLORIDE 20 MEQ PO PACK
40.0000 meq | PACK | Freq: Once | ORAL | Status: AC
  Administered 2022-11-24: 40 meq via ORAL
  Filled 2022-11-24: qty 2

## 2022-11-24 MED ORDER — POTASSIUM CHLORIDE CRYS ER 20 MEQ PO TBCR: 40.0000 meq | EXTENDED_RELEASE_TABLET | Freq: Once | ORAL | Status: DC

## 2022-11-24 MED ORDER — PREDNISONE 20 MG PO TABS
40.0000 mg | ORAL_TABLET | Freq: Every day | ORAL | Status: AC
  Administered 2022-11-25 – 2022-11-26 (×2): 40 mg via ORAL
  Filled 2022-11-24 (×2): qty 2

## 2022-11-24 MED ORDER — POTASSIUM CHLORIDE CRYS ER 20 MEQ PO TBCR
40.0000 meq | EXTENDED_RELEASE_TABLET | ORAL | Status: AC
  Administered 2022-11-24 (×2): 40 meq via ORAL
  Filled 2022-11-24 (×2): qty 2

## 2022-11-24 MED ORDER — HYDRALAZINE HCL 20 MG/ML IJ SOLN: 10.0000 mg | Freq: Four times a day (QID) | INTRAMUSCULAR | Status: DC | PRN

## 2022-11-24 NOTE — Progress Notes (Signed)
PROGRESS NOTE    Kelly MantleShirley Rowland  VWU:[id number] DOB: 10-27-1941 DOA: 11/21/2022 PCP: Lake BellsJacokes, Kelly N, MD   Brief Narrative: 81 year old with past medical history significant for CVA, hypertension, GI bleed from duodenal ulcer, [place] who was admitted 11/21/2022 with shortness of breath.  He was found to have COVID pneumonia, chest x-ray with mild interstitial pulmonary edema small bilateral pleural effusion, moderate emphysema.  No prior diagnosis for COPD.  Patient declined CT angio however duplex left lower extremity was positive for DVT.  Patient was started on heparin drip.  She received 1 dose of IV Lasix.    Assessment & Plan:   Principal Problem:   Acute hypoxic respiratory failure (HCC) Active Problems:   COVID-19 virus infection   DVT of popliteal vein (HCC)   Chronic blood loss anemia   Duodenal ulcer with hemorrhage   Peripheral arterial disease (HCC)   Heart failure, unspecified (HCC)   Essential hypertension   Diabetes mellitus (HCC)   Intermittent claudication (HCC)   Coronary artery disease   Chronic kidney disease, stage 3a (HCC)   Restless legs   Acute respiratory failure with hypoxia (HCC)   1-Acute hypoxic respiratory failure Secondary to COVID-19 infection and possible PE -Decline CTA due to her history of CKD.  -Continue with heparin.  -Wean off oxygen as tolerated.  -On prednisone and Remdesivir.   COVID-19 infection: -On prednisone and Remdesivir.   DVT; Doppler: Positive for calf vein DVT in the right peroneal vein  Continue with Heparin Gtt.  If hb stable--- could transition to eliquis 12/13.  History of GI bleed in November 2023 Recent admission 2 months ago for bleeding from PUD Suggestion received 3 units of packed red blood cell. Continue With PPI Monitor hemoglobin  Diabetes type 2:  Continue with Semglee.  Continue with a sliding scale insulin   Heart failure preserved ejection fraction  diastolic dysfunction chronic Resume Lasix  Hypokalemia: Replete orally  Hypertension: Continue with Coreg, amlodipine.  Resume Lasix PRN  hydralazine  Peripheral vascular disease Bilateral iliac stent in 2003, left SVS 2S FA via left anterograde stick 08/2017 Recent left carotid endarterectomy 11/17/2024 Continue with the statins  CAD: Continue Coreg, Norvasc Continue aspirin dialysis on heparin drip  CKD stage IIIa: Monitor renal function Restless leg syndrome:         Estimated body mass index is 27.44 kg/m as calculated from the following:   Height as of this encounter: 5\' 2"  (1.575 m).   Weight as of this encounter: 68 kg.   DVT prophylaxis:) Heparin gtt.  Code Status: Full code Family Communication: care discussed with patient.  Disposition Plan:  Status is: Inpatient Remains inpatient appropriate because: management of covid, DVT    Consultants:  None  Procedures:  Doppler; positive for dvt  Antimicrobials:    Subjective: She report dyspnea improved, report cough.   Objective: Vitals:   11/23/22 2000 11/23/22 2312 11/24/22 0224 11/24/22 0501  BP: (!) 161/60 (!) 149/55 (!) 179/61 (!) 163/60  Pulse: (!) 53 62 (!) 56 67  Resp: 12 18 18 18   Temp: 97.8 F (36.6 C)   98 F (36.7 C)  TempSrc:    Oral  SpO2: 91% 96% 93% 93%  Weight:      Height:        Intake/Output Summary (Last 24 hours) at 11/24/2022 0842 Last data filed at 11/24/2022 0732 Gross per 24 hour  Intake 463.87 ml  Output 650 ml  Net -186.13 ml   Ceasar MonsFiled  Weights   11/21/22 1854  Weight: 68 kg    Examination:  General exam: Appears calm and comfortable  Respiratory system: BL ronchus. Respiratory effort normal. Cardiovascular system: S1 & S2 heard, RRR. No JVD, murmurs, rubs, gallops or clicks. No pedal edema. Gastrointestinal system: Abdomen is nondistended, soft and nontender. No organomegaly or masses felt. Normal bowel sounds heard. Central nervous system: Alert and  oriented. No focal neurological deficits. Extremities: Symmetric 5 x 5 power.   Data Reviewed: I have personally reviewed following labs and imaging studies  CBC: Recent Labs  Lab 11/21/22 1903 11/22/22 0143 11/24/22 0455  WBC 4.6 4.5 6.1  NEUTROABS 3.4  --   --   HGB 7.6* 8.3* 8.8*  HCT 24.6* 27.1* 28.6*  MCV 89.1 89.4 86.7  PLT 236 241 372   Basic Metabolic Panel: Recent Labs  Lab 11/21/22 1903 11/22/22 0143 11/24/22 0455  NA 134* 136 136  K 3.4* 3.3* 2.9*  CL 98 99 96*  CO2 28 31 30   GLUCOSE 227* 209* 207*  BUN 13 9 19   CREATININE 0.84 0.67 0.90  CALCIUM 8.0* 8.1* 8.0*   GFR: Estimated Creatinine Clearance: 45.8 mL/min (by C-G formula based on SCr of 0.9 mg/dL). Liver Function Tests: Recent Labs  Lab 11/21/22 1903 11/22/22 0143  AST 16 16  ALT 13 12  ALKPHOS 80 82  BILITOT 0.6 0.5  PROT 5.2* 5.5*  ALBUMIN 2.3* 2.3*   No results for input(s): "LIPASE", "AMYLASE" in the last 168 hours. No results for input(s): "AMMONIA" in the last 168 hours. Coagulation Profile: Recent Labs  Lab 11/22/22 1540  INR 1.1   Cardiac Enzymes: No results for input(s): "CKTOTAL", "CKMB", "CKMBINDEX", "TROPONINI" in the last 168 hours. BNP (last 3 results) No results for input(s): "PROBNP" in the last 8760 hours. HbA1C: No results for input(s): "HGBA1C" in the last 72 hours. CBG: Recent Labs  Lab 11/22/22 2108 11/23/22 0737 11/23/22 1109 11/23/22 1614 11/24/22 0818  GLUCAP 195* 167* 195* 225* 180*   Lipid Profile: No results for input(s): "CHOL", "HDL", "LDLCALC", "TRIG", "CHOLHDL", "LDLDIRECT" in the last 72 hours. Thyroid Function Tests: No results for input(s): "TSH", "T4TOTAL", "FREET4", "T3FREE", "THYROIDAB" in the last 72 hours. Anemia Panel: No results for input(s): "VITAMINB12", "FOLATE", "FERRITIN", "TIBC", "IRON", "RETICCTPCT" in the last 72 hours. Sepsis Labs: Recent Labs  Lab 11/22/22 0535  PROCALCITON <0.10    Recent Results (from the past 240  hour(s))  MRSA Next Gen by PCR, Nasal     Status: None   Collection Time: 11/16/22  2:32 PM   Specimen: Nasal Mucosa; Nasal Swab  Result Value Ref Range Status   MRSA by PCR Next Gen NOT DETECTED NOT DETECTED Final    Comment: (NOTE) The GeneXpert MRSA Assay (FDA approved for NASAL specimens only), is one component of a comprehensive MRSA colonization surveillance program. It is not intended to diagnose MRSA infection nor to guide or monitor treatment for MRSA infections. Test performance is not FDA approved in patients less than 19 years old. Performed at [facility], [address] [place], [address] [place]   Resp panel by RT-PCR (RSV, Flu A&B, Covid) Anterior Nasal Swab     Status: Abnormal   Collection Time: 11/21/22  7:17 PM   Specimen: Anterior Nasal Swab  Result Value Ref Range Status   SARS Coronavirus 2 by RT PCR POSITIVE (A) NEGATIVE Final    Comment: (NOTE) SARS-CoV-2 target nucleic acids are DETECTED.  The SARS-CoV-2 RNA is generally detectable  in upper respiratory specimens during the acute phase of infection. Positive results are indicative of the presence of the identified virus, but do not rule out bacterial infection or co-infection with other pathogens not detected by the test. Clinical correlation with patient history and other diagnostic information is necessary to determine patient infection status. The expected result is Negative.  Fact Sheet for Patients: BloggerCourse.com  Fact Sheet for Healthcare Providers: SeriousBroker.it  This test is not yet approved or cleared by the [place] FDA and  has been authorized for detection and/or diagnosis of SARS-CoV-2 by FDA under an Emergency Use Authorization (EUA).  This EUA will remain in effect (meaning this test can be used) for the duration of  the COVID-19 declaration under Section 564(b)(1) of the A ct, 21 U.S.C. section 360bbb-3(b)(1),  unless the authorization is terminated or revoked sooner.     Influenza A by PCR NEGATIVE NEGATIVE Final   Influenza B by PCR NEGATIVE NEGATIVE Final    Comment: (NOTE) The Xpert Xpress SARS-CoV-2/FLU/RSV plus assay is intended as an aid in the diagnosis of influenza from Nasopharyngeal swab specimens and should not be used as a sole basis for treatment. Nasal washings and aspirates are unacceptable for Xpert Xpress SARS-CoV-2/FLU/RSV testing.  Fact Sheet for Patients: BloggerCourse.com  Fact Sheet for Healthcare Providers: SeriousBroker.it  This test is not yet approved or cleared by the [place] FDA and has been authorized for detection and/or diagnosis of SARS-CoV-2 by FDA under an Emergency Use Authorization (EUA). This EUA will remain in effect (meaning this test can be used) for the duration of the COVID-19 declaration under Section 564(b)(1) of the Act, 21 U.S.C. section 360bbb-3(b)(1), unless the authorization is terminated or revoked.     Resp Syncytial Virus by PCR NEGATIVE NEGATIVE Final    Comment: (NOTE) Fact Sheet for Patients: BloggerCourse.com  Fact Sheet for Healthcare Providers: SeriousBroker.it  This test is not yet approved or cleared by the [place] FDA and has been authorized for detection and/or diagnosis of SARS-CoV-2 by FDA under an Emergency Use Authorization (EUA). This EUA will remain in effect (meaning this test can be used) for the duration of the COVID-19 declaration under Section 564(b)(1) of the Act, 21 U.S.C. section 360bbb-3(b)(1), unless the authorization is terminated or revoked.  Performed at [facility], [address] [place], [place] [address]          Radiology Studies: US Venous Img Lower Bilateral (DVT)  Result Date: 11/22/2022 CLINICAL DATA:  Lower extremity edema EXAM: BILATERAL LOWER EXTREMITY  VENOUS DOPPLER ULTRASOUND TECHNIQUE: Gray-scale sonography with compression, as well as color and duplex ultrasound, were performed to evaluate the deep venous system(s) from the level of the common femoral vein through the popliteal and proximal calf veins. COMPARISON:  None Available. FINDINGS: VENOUS Normal compressibility and Doppler waveforms of the common femoral, superficial femoral, and popliteal veins bilaterally. Occlusive thrombus noted in the right peroneal vein. The left peroneal vein was not well seen due to difficulties related to patient movement. OTHER None. Limitations: Patient movement IMPRESSION: 1. Positive for calf vein DVT in the right peroneal vein. Otherwise negative exam. Electronically Signed   By: Gaylyn Rong M.D.   On: 11/22/2022 13:20        Scheduled Meds:  amLODipine  5 mg Oral Daily   atorvastatin  40 mg Oral QHS   carvedilol  25 mg Oral BID WC   insulin aspart  0-15 Units Subcutaneous TID WC   insulin glargine-yfgn  12 Units Subcutaneous Daily   mometasone-formoterol  2 puff Inhalation BID   pantoprazole (PROTONIX) IV  40 mg Intravenous Q12H   potassium chloride  40 mEq Oral Q4H   predniSONE  60 mg Oral Q breakfast   sodium chloride flush  3 mL Intravenous Q12H   Continuous Infusions:  heparin 850 Units/hr (11/24/22 0732)     LOS: 3 days    Time spent: 35  minutes    Dillon Mcreynolds A Cayli Escajeda, MD [facility]   If 7PM-7AM, please contact night-coverage [email or web address]  11/24/2022, 8:42 AM

## 2022-11-24 NOTE — Progress Notes (Signed)
ANTICOAGULATION CONSULT NOTE  Pharmacy Consult for IV heparin  Indication: VTE Treatment  Allergies  Allergen Reactions   Amoxicillin Anaphylaxis   Lisinopril Anaphylaxis   Tramadol Nausea Only   Colchicine Nausea Only    Patient Measurements: Height: 5\' 2"  (157.5 cm) Weight: 68 kg (150 lb) IBW/kg (Calculated) : 50.1 Heparin Dosing Weight: 68 kg  Vital Signs: Temp: 98 F (36.7 C) (12/12 0501) Temp Source: Oral (12/12 0501) BP: 163/60 (12/12 0501) Pulse Rate: 67 (12/12 0501)  Labs: Recent Labs    11/21/22 1903 11/22/22 0143 11/22/22 1540 11/23/22 0150 11/23/22 0942 11/23/22 1832 11/24/22 0455  HGB 7.6* 8.3*  --   --   --   --  8.8*  HCT 24.6* 27.1*  --   --   --   --  28.6*  PLT 236 241  --   --   --   --  372  APTT  --   --  30  --   --   --   --   LABPROT  --   --  13.8  --   --   --   --   INR  --   --  1.1  --   --   --   --   HEPARINUNFRC  --   --   --    < > 0.63 0.90* 0.64  CREATININE 0.84 0.67  --   --   --   --  0.90  TROPONINIHS 9  --   --   --   --   --   --    < > = values in this interval not displayed.     Estimated Creatinine Clearance: 45.8 mL/min (by C-G formula based on SCr of 0.9 mg/dL).   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 PMH includes extensive history of acute blood loss anemia  from GI bleed/ulcer (GI bleed 11/04/2022). Venous ultrasound showing DVT in right peroneal vein. Also with respiratory distress on 12/10, though patient is refusing CTA. Pharmacy has been consulted to manage heparin for new DVT / possible PE.  Hgb 7-8 at baseline (though unclear if GI oozing/hemorrhage contributory to low baseline Hgb). aPTT 30s and INR 1.1  Goal of Therapy:  Heparin level 0.3-0.7 units/ml Monitor platelets by anticoagulation protocol: Yes **No bolus protocol**  No boluses due to extensive GI bleed history / low hemoglobin present this admission.  Plan: heparin level therapeutic x 1 reduce heparin infusion rate to 850 units/hr Recheck heparin level 8 hours to confirm CBC daily. Monitor H&H closely.  14/10, PharmD, [facility] 11/24/2022 6:29 AM

## 2022-11-24 NOTE — Progress Notes (Signed)
       CROSS COVER NOTE  NAME: Dillon Mcreynolds MRN: [id number] DOB : 1941/01/03 ATTENDING PHYSICIAN: Salomon Mast*    Date of Service   11/24/2022   HPI/Events of Note   Medication request received for home Gabapentin for patient report of neuropathic pain.  Interventions   Assessment/Plan:  100 mg Gabapentin PO x1      This document was prepared using Dragon voice recognition software and may include unintentional dictation errors.  Bishop Limbo DNP, MBA, FNP-BC Nurse Practitioner [facility] Pager [phone]

## 2022-11-24 NOTE — Progress Notes (Signed)
ANTICOAGULATION CONSULT NOTE  Pharmacy Consult for IV heparin  Indication: VTE Treatment  Allergies  Allergen Reactions   Amoxicillin Anaphylaxis   Lisinopril Anaphylaxis   Tramadol Nausea Only   Colchicine Nausea Only    Patient Measurements: Height: 5\' 2"  (157.5 cm) Weight: 68 kg (150 lb) IBW/kg (Calculated) : 50.1 Heparin Dosing Weight: 68 kg  Vital Signs: Temp: 98 F (36.7 C) (12/12 0501) Temp Source: Oral (12/12 0501) BP: 176/60 (12/12 1430) Pulse Rate: 58 (12/12 1430)  Labs: Recent Labs    11/21/22 1903 11/22/22 0143 11/22/22 1540 11/23/22 0150 11/23/22 1832 11/24/22 0455 11/24/22 1332  HGB 7.6* 8.3*  --   --   --  8.8*  --   HCT 24.6* 27.1*  --   --   --  28.6*  --   PLT 236 241  --   --   --  372  --   APTT  --   --  30  --   --   --   --   LABPROT  --   --  13.8  --   --   --   --   INR  --   --  1.1  --   --   --   --   HEPARINUNFRC  --   --   --    < > 0.90* 0.64 0.58  CREATININE 0.84 0.67  --   --   --  0.90  --   TROPONINIHS 9  --   --   --   --   --   --    < > = values in this interval not displayed.     Estimated Creatinine Clearance: 45.8 mL/min (by C-G formula based on SCr of 0.9 mg/dL).   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 PMH includes extensive history of acute blood loss anemia  from GI bleed/ulcer (GI bleed 11/04/2022). Venous ultrasound showing DVT in right peroneal vein. Also with respiratory distress on 12/10, though patient is refusing CTA. Pharmacy has been consulted to manage heparin for new DVT / possible PE.  Hgb 7-8 at baseline (though unclear if GI oozing/hemorrhage contributory to low baseline Hgb). aPTT 30s and INR 1.1  Goal of Therapy:  Heparin level 0.3-0.7 units/ml Monitor platelets by anticoagulation protocol: Yes **No bolus protocol**  No boluses due to extensive GI bleed history / low hemoglobin present this admission.  Plan: heparin level therapeutic x 2 Continue heparin infusion at 850 units/hr Recheck heparin level 12/13 with AM labs CBC daily. Monitor H&H closely.  1/14, PharmD Clinical Pharmacist  11/24/2022 2:54 PM

## 2022-11-24 NOTE — Evaluation (Signed)
Physical Therapy Evaluation Patient Details Name: Kelly Rowland MRN: [id number] DOB: Apr 25, 1941 Today's Date: 11/24/2022  History of Present Illness  Pt is a 81 year old with past medical history significant for CVA, DM, CKD 3a, hypertension, GI bleed from duodenal ulcer with chronic blood loss anemia, [place]arte[place] 11/17/2022 who was admitted 11/21/2022 with shortness of breath.  MD assessment includes: COVID pneumonia, mild interstitial pulmonary edema small bilateral pleural effusion, moderate emphysema, DVT with pt started on heparin drip, acute hypoxic respiratory failure, and hypokalemia.   Clinical Impression  Pt was pleasant and motivated to participate during the session and put forth good effort throughout. Pt required extra time and effort with bed mobility tasks and physical assistance with transfers and gait to prevent LOB.  Pt was only able to amb a max of 10 feet before becoming fatigued and needing to return to sitting.  Pt's SpO2 was primarily in the mid 90s on 3LO2/min but dropped to a low of 88% during ambulation.  No adverse symptoms noted by the patient during the session.  Pt lives alone with limited assistance available and is at a very high risk for falls. Pt will benefit from PT services in a SNF setting upon discharge to safely address deficits listed in patient problem list for decreased caregiver assistance and eventual return to PLOF.         Recommendations for follow up therapy are one component of a multi-disciplinary discharge planning process, led by the attending physician.  Recommendations may be updated based on patient status, additional functional criteria and insurance authorization.  Follow Up Recommendations Skilled nursing-short term rehab (<3 hours/day) Can patient physically be transported by private vehicle: Yes    Assistance Recommended at Discharge Intermittent Supervision/Assistance  Patient can return home with the following   A lot of help with walking and/or transfers;A lot of help with bathing/dressing/bathroom;Assistance with cooking/housework;Direct supervision/assist for medications management;Direct supervision/assist for financial management;Assist for transportation;Help with stairs or ramp for entrance    Equipment Recommendations Other (comment) (TBD at next venue of care)  Recommendations for Other Services       Functional Status Assessment Patient has had a recent decline in their functional status and demonstrates the ability to make significant improvements in function in a reasonable and predictable amount of time.     Precautions / Restrictions Precautions Precautions: Fall Restrictions Weight Bearing Restrictions: No Other Position/Activity Restrictions: Covid +      Mobility  Bed Mobility Overal bed mobility: Needs Assistance Bed Mobility: Sit to Supine, Supine to Sit Rolling: Supervision         General bed mobility comments: Cuing for sequencing with extra time, effort, and use of bed rail    Transfers Overall transfer level: Needs assistance Equipment used: Rolling walker (2 wheels) Transfers: Sit to/from Stand Sit to Stand: Mod assist, From elevated surface           General transfer comment: Mod A to come to standing and prevent posterior LOB upon initial stand    Ambulation/Gait Ambulation/Gait assistance: Min assist Gait Distance (Feet): 10 Feet Assistive device: Rolling walker (2 wheels) Gait Pattern/deviations: Decreased step length - right, Decreased step length - left, Step-through pattern Gait velocity: significantly decreased     General Gait Details: Occasional min A to prevent LOB during amb with pt only able to amb a max of 10 feet before fatigue required pt to return to sitting  Careers information officer  Modified Rankin (Stroke Patients Only)       Balance Overall balance assessment: Needs assistance   Sitting  balance-Leahy Scale: Good     Standing balance support: Bilateral upper extremity supported, During functional activity, Reliant on assistive device for balance Standing balance-Leahy Scale: Poor                               Pertinent Vitals/Pain Pain Assessment Pain Assessment: No/denies pain    Home Living Family/patient expects to be discharged to:: Private residence Living Arrangements: Alone Available Help at Discharge: Family;Available PRN/intermittently Type of Home: Apartment Home Access: Level entry       Home Layout: One level Home Equipment: Rollator (4 wheels);Cane - single point;Shower seat;Grab bars - tub/shower;Grab bars - toilet      Prior Function Prior Level of Function : Driving;Independent/Modified Independent             Mobility Comments: Mod Ind amb with a rollator at home and with a SPC in the community, 2-3 falls in the last year, drives ADLs Comments: Ind with ADLs and IADLs     Hand Dominance   Dominant Hand: Right    Extremity/Trunk Assessment   Upper Extremity Assessment Upper Extremity Assessment: Generalized weakness    Lower Extremity Assessment Lower Extremity Assessment: Generalized weakness       Communication   Communication: No difficulties  Cognition Arousal/Alertness: Awake/alert Behavior During Therapy: WFL for tasks assessed/performed Overall Cognitive Status: Within Functional Limits for tasks assessed                                          General Comments      Exercises     Assessment/Plan    PT Assessment Patient needs continued PT services  PT Problem List Decreased strength;Decreased activity tolerance;Decreased balance;Decreased mobility;Decreased knowledge of use of DME       PT Treatment Interventions DME instruction;Therapeutic exercise;Balance training;Gait training;Neuromuscular re-education;Functional mobility training;Therapeutic activities;Patient/family  education    PT Goals (Current goals can be found in the Care Plan section)  Acute Rehab PT Goals Patient Stated Goal: To get stronger PT Goal Formulation: With patient Time For Goal Achievement: 12/07/22 Potential to Achieve Goals: Good    Frequency Min 2X/week     Co-evaluation               AM-PAC PT "6 Clicks" Mobility  Outcome Measure Help needed turning from your back to your side while in a flat bed without using bedrails?: A Little Help needed moving from lying on your back to sitting on the side of a flat bed without using bedrails?: A Little Help needed moving to and from a bed to a chair (including a wheelchair)?: A Lot Help needed standing up from a chair using your arms (e.g., wheelchair or bedside chair)?: A Lot Help needed to walk in hospital room?: A Lot Help needed climbing 3-5 steps with a railing? : Total 6 Click Score: 13    End of Session Equipment Utilized During Treatment: Oxygen;Gait belt Activity Tolerance: Patient tolerated treatment well Patient left: in bed;with bed alarm set;with call bell/phone within reach Nurse Communication: Mobility status PT Visit Diagnosis: Muscle weakness (generalized) (M62.81);Difficulty in walking, not elsewhere classified (R26.2);History of falling (Z91.81);Unsteadiness on feet (R26.81)    Time: 3154-0086 PT Time Calculation (min) (ACUTE ONLY):  23 min   Charges:   PT Evaluation $PT Eval Moderate Complexity: 1 Mod PT Treatments $Therapeutic Activity: 8-22 mins       D. Elly Modena PT, DPT 11/24/22, 3:56 PM

## 2022-11-25 LAB — GLUCOSE, CAPILLARY
Glucose-Capillary: 135 mg/dL — ABNORMAL HIGH (ref 70–99)
Glucose-Capillary: 207 mg/dL — ABNORMAL HIGH (ref 70–99)
Glucose-Capillary: 276 mg/dL — ABNORMAL HIGH (ref 70–99)
Glucose-Capillary: 271 mg/dL — ABNORMAL HIGH (ref 70–99)

## 2022-11-25 LAB — BASIC METABOLIC PANEL
Anion gap: 6 (ref 5–15)
BUN: 17 mg/dL (ref 8–23)
CO2: 33 mmol/L — ABNORMAL HIGH (ref 22–32)
Calcium: 8 mg/dL — ABNORMAL LOW (ref 8.9–10.3)
Chloride: 99 mmol/L (ref 98–111)
Creatinine, Ser: 0.84 mg/dL (ref 0.44–1.00)
GFR, Estimated: >60 mL/min (ref >60)
Glucose, Bld: 176 mg/dL — ABNORMAL HIGH (ref 70–99)
Potassium: 3.7 mmol/L (ref 3.5–5.1)
Sodium: 138 mmol/L (ref 135–145)

## 2022-11-25 LAB — CBC
HCT: 28.7 % — ABNORMAL LOW (ref 36.0–46.0)
Hemoglobin: 8.8 g/dL — ABNORMAL LOW (ref 12.0–15.0)
MCH: 26.5 pg (ref 26.0–34.0)
MCHC: 30.7 g/dL (ref 30.0–36.0)
MCV: 86.4 fL (ref 80.0–100.0)
Platelets: 360 10*3/uL (ref 150–400)
RBC: 3.32 MIL/uL — ABNORMAL LOW (ref 3.87–5.11)
RDW: 15.4 % (ref 11.5–15.5)
WBC: 5.7 10*3/uL (ref 4.0–10.5)
nRBC: 0 % (ref 0.0–0.2)

## 2022-11-25 LAB — HEPARIN LEVEL (UNFRACTIONATED): Heparin Unfractionated: 0.53 IU/mL (ref 0.30–0.70)

## 2022-11-25 MED ORDER — GABAPENTIN 100 MG PO CAPS
100.0000 mg | ORAL_CAPSULE | Freq: Once | ORAL | Status: AC
  Administered 2022-11-25: 100 mg via ORAL
  Filled 2022-11-25: qty 1

## 2022-11-25 MED ORDER — ENSURE MAX PROTEIN PO LIQD
11.0000 [oz_av] | Freq: Every day | ORAL | Status: DC
  Administered 2022-11-25 – 2022-11-28 (×2): 11 [oz_av] via ORAL
  Filled 2022-11-25: qty 330

## 2022-11-25 MED ORDER — GLUCERNA SHAKE PO LIQD
237.0000 mL | Freq: Two times a day (BID) | ORAL | Status: DC
  Administered 2022-11-26 – 2022-12-01 (×5): 237 mL via ORAL

## 2022-11-25 MED ORDER — POLYETHYLENE GLYCOL 3350 17 G PO PACK
17.0000 g | PACK | Freq: Every day | ORAL | Status: DC
  Administered 2022-11-26 – 2022-12-01 (×5): 17 g via ORAL
  Filled 2022-11-25 (×6): qty 1

## 2022-11-25 MED ORDER — ADULT MULTIVITAMIN W/MINERALS CH
1.0000 | ORAL_TABLET | Freq: Every day | ORAL | Status: DC
  Administered 2022-11-25 – 2022-12-01 (×7): 1 via ORAL
  Filled 2022-11-25 (×7): qty 1

## 2022-11-25 NOTE — Progress Notes (Signed)
ANTICOAGULATION CONSULT NOTE  Pharmacy Consult for IV heparin  Indication: VTE Treatment  Allergies  Allergen Reactions   Amoxicillin Anaphylaxis   Lisinopril Anaphylaxis   Tramadol Nausea Only   Colchicine Nausea Only    Patient Measurements: Height: 5\' 2"  (157.5 cm) Weight: 64.2 kg (141 lb 8.6 oz) IBW/kg (Calculated) : 50.1 Heparin Dosing Weight: 68 kg  Vital Signs: Temp: 98.9 F (37.2 C) (12/13 0333) Temp Source: Oral (12/13 0333) BP: 155/68 (12/13 0333) Pulse Rate: 62 (12/13 0333)  Labs: Recent Labs    11/22/22 1540 11/23/22 0150 11/24/22 0455 11/24/22 1332 11/25/22 0334  HGB  --   --  8.8*  --  8.8*  HCT  --   --  28.6*  --  28.7*  PLT  --   --  372  --  360  APTT 30  --   --   --   --   LABPROT 13.8  --   --   --   --   INR 1.1  --   --   --   --   HEPARINUNFRC  --    < > 0.64 0.58 0.53  CREATININE  --   --  0.90  --  0.84   < > = values in this interval not displayed.     Estimated Creatinine Clearance: 47.8 mL/min (by C-G formula based on SCr of 0.84 mg/dL).   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 PMH includes extensive history of acute blood loss anemia from GI bleed/ulcer (GI bleed 11/04/2022). Venous ultrasound showing DVT in right peroneal vein. Also with respiratory distress on 12/10, though  patient is refusing CTA. Pharmacy has been consulted to manage heparin for new DVT / possible PE.  Hgb 7-8 at baseline (though unclear if GI oozing/hemorrhage contributory to low baseline Hgb). aPTT 30s and INR 1.1  Goal of Therapy:  Heparin level 0.3-0.7 units/ml Monitor platelets by anticoagulation protocol: Yes **No bolus protocol**  No boluses due to extensive GI bleed history / low hemoglobin present this admission.  Plan: heparin level therapeutic x 3 Continue heparin infusion at 850 units/hr Recheck heparin level 12/14 with AM labs CBC daily. Monitor H&H closely.  1/15, PharmD, [facility] 11/25/2022 5:27 AM

## 2022-11-25 NOTE — TOC Initial Note (Signed)
Transition of Care [facility]) - Initial/Assessment Note    Patient Details  Name: Kelly Rowland MRN: [id number] Date of Birth: [birth year]  Transition of Care [facility]) CM/SW Contact:    Darolyn Rua, LCSW Phone Number: 11/25/2022, 9:43 AM  Clinical Narrative:                  CSW spoke with patient over the phone as patient has COVID.   Patient confirmed being agreeable to SNF recommendations, however she does not wish to return to [facility]. Is interested in exploring other options.   CSW has sent out referrals, pending bed offers at this time.   Expected Discharge Plan: Skilled Nursing Facility Barriers to Discharge: Continued Medical Work up   Patient Goals and CMS Choice Patient states their goals for this hospitalization and ongoing recovery are:: to go home CMS Medicare.gov Compare Post Acute Care list provided to:: Patient Choice offered to / list presented to : Patient  Expected Discharge Plan and Services Expected Discharge Plan: Skilled Nursing Facility                                              Prior Living Arrangements/Services   Lives with:: Self                   Activities of Daily Living      Permission Sought/Granted                  Emotional Assessment              Admission diagnosis:  SOB (shortness of breath) [R06.02] Acute respiratory failure with hypoxia (HCC) [J96.01] Viral pneumonia [J12.9] COVID-19 virus infection [U07.1] Patient Active Problem List   Diagnosis Date Noted   Acute respiratory failure with hypoxia (HCC) 11/23/2022   COVID-19 virus infection 11/22/2022   DVT of popliteal vein (HCC) 11/22/2022   Acute hypoxic respiratory failure (HCC) 11/21/2022   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) 10/30/2022   Chronic blood loss anemia 10/28/2022   Chronic kidney disease, stage 3a (HCC) 10/28/2022   Carotid stenosis, symptomatic, with infarction (HCC) 10/28/2022   Duodenal  ulcer with hemorrhage 10/22/2022   Lumbosacral spondylosis without myelopathy 09/16/2022   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 [place] [address] Phone: [phone] Fax: [phone]     Social Determinants of Health (SDOH) Interventions    Readmission Risk Interventions    11/17/2022   10:57 AM  Readmission Risk Prevention Plan  Transportation Screening Complete  PCP or Specialist Appt within 3-5 Days Complete  HRI or Home Care Consult Complete  Social Work Consult for Recovery Care Planning/Counseling Complete  Palliative Care Screening Not Applicable  Medication Review [employer]) Complete

## 2022-11-25 NOTE — Progress Notes (Addendum)
Initial Nutrition Assessment  DOCUMENTATION CODES:   Non-severe (moderate) malnutrition in context of chronic illness  INTERVENTION:   -Glucerna Shake po BID, each supplement provides 220 kcal and 10 grams of protein  -Ensure Max po daily, each supplement provides 150 kcal and 30 grams of protein -MVI with minerals daily  NUTRITION DIAGNOSIS:   Moderate Malnutrition related to chronic illness (COVID) as evidenced by mild fat depletion, moderate muscle depletion, severe muscle depletion.  GOAL:   Patient will meet greater than or equal to 90% of their needs  MONITOR:   PO intake, Supplement acceptance  REASON FOR ASSESSMENT:   Malnutrition Screening Tool    ASSESSMENT:   Pt with past medical history significant for CVA, hypertension, GI bleed from duodenal ulcer, recent [place] endarte[place] 11/17/2022 who was admitted 11/21/2022 with shortness of breath.  She was found to have COVID pneumonia, chest x-ray with mild interstitial pulmonary edema small bilateral pleural effusion, moderate emphysema.  No prior diagnosis for COPD.  Patient declined CT angio however duplex left lower extremity was positive for DVT.  Patient was started on heparin drip.  She received 1 dose of IV Lasix.  Pt admitted with acute hypoxic respiratory failure secondary to COVID infection and possible PE.   Reviewed I/O's: -2.2 L x 24 hours and -3.8 L since admission  UOP: 3.2 L x 24 hours   Spoke with pt at bedside, who reports that she has experienced a general decline in health over the past month. She explains that she was admitted last month for a GIB and transferred for SNF for the pst 3 weeks. Per pt, her intake was very poor at SNF due to lack of variety and poor food quality ("they gave me mashed potatoes and corn everyday"). Pt shares that she has good day and bad days with eating and is focused on getting more protein to help build her strength. Pt consumed about 75% of sausage biscuit this  morning and is looking forward to the chicken she ordered for lunch. Docuemnted meal completions 25-50%.  Pt reports her UBW is around 150# and estimates she has lost 15-20# within the past month. Reviewed wt hx; pt has experienced a 1.7% wt loss over the past month, which is not significant for time frame.   Per pt, she was making fair progress with therapy PTA and feels frustrated when her decline since COVID. She was happy to be able to work with therapy today. Emotional support provided.   Discussed importance of good meal and supplement intake to promote healing. Pt does not like the taste of Ensure, but prefers strawberry supplements.   Per TOC notes, plan for SNF placement at d/c.   Medications reviewed and include lasix and prednisone.   Lab Results  Component Value Date   HGBA1C 7.2 (H) 10/17/2022   PTA DM medications are none.   Labs reviewed: CBGS: 135-207 (inpatient orders for glycemic control are 0-15 units insulin aspart TID with meals and 12 units insulin glargine-yfgn daily).    NUTRITION - FOCUSED PHYSICAL EXAM:  Flowsheet Row Most Recent Value  Orbital Region Moderate depletion  Upper Arm Region Mild depletion  Thoracic and Lumbar Region No depletion  Buccal Region Mild depletion  Temple Region Moderate depletion  Clavicle Bone Region Mild depletion  Clavicle and Acromion Bone Region Mild depletion  Dorsal Hand Mild depletion  Patellar Region Moderate depletion  Anterior Thigh Region Moderate depletion  Posterior Calf Region Moderate depletion  Edema (RD Assessment) None  Hair  Reviewed  Eyes Reviewed  Mouth Reviewed  Skin Reviewed  Nails Reviewed       Diet Order:   Diet Order             Diet Carb Modified Fluid consistency: Thin; Room service appropriate? Yes  Diet effective now                   EDUCATION NEEDS:   Education needs have been addressed  Skin:  Skin Assessment: Skin Integrity Issues: Skin Integrity Issues::  Incisions Incisions: closed neck  Last BM:  11/23/22  Height:   Ht Readings from Last 1 Encounters:  11/21/22 5\' 2"  (1.575 m)    Weight:   Wt Readings from Last 1 Encounters:  11/25/22 64.2 kg    Ideal Body Weight:  50 kg  BMI:  Body mass index is 25.89 kg/m.  Estimated Nutritional Needs:   Kcal:  1550-1750  Protein:  85-100 grams  Fluid:  > 1.5 L    11/27/22, RD, LDN, CDCES Registered Dietitian II Certified Diabetes Care and Education Specialist Please refer to [facility] for RD and/or RD on-call/weekend/after hours pager

## 2022-11-25 NOTE — Progress Notes (Signed)
       CROSS COVER NOTE  NAME: Kelly Rowland MRN: [id number] DOB : 1941-09-30 ATTENDING PHYSICIAN: Kelly Rowland*    Date of Service   11/25/2022   HPI/Events of Note   Medication request received for home gabapentin and stool softener. Last bowel movement 11/23/22 and Kelly Rowland is prescribed opioids.  Interventions   Assessment/Plan: Gabapentin Miralax       To reach the provider On-Call:   7AM- 7PM see care teams to locate the attending and reach out to them via [email or web address]. 7PM-7AM contact night-coverage If you still have difficulty reaching the appropriate provider, please page the [facility] (Director on Call) for [facility] on amion for assistance  This document was prepared using Sales executive software and may include unintentional dictation errors.  Kelly Limbo DNP, MBA, FNP-BC Nurse Practitioner [facility] Pager [phone]

## 2022-11-25 NOTE — [place] FL2 (Signed)
[place] MEDICAID FL2 LEVEL OF CARE FORM     IDENTIFICATION  Patient Name: Kelly Rowland Birthdate: 1941-03-19 Sex: female Admission Date (Current Location): 11/21/2022  [facility] and [place] Number:  Chiropodist and Address:  [facility], [address] [place], [place] [address]      Provider Number: [id number]  Attending Physician Name and Address:  Salomon Mast*  Relative Name and Phone Number:  Dorlisa Savino [phone]    Current Level of Care: Hospital Recommended Level of Care: Skilled Nursing Facility Prior Approval Number:    Date Approved/Denied:   PASRR Number: [id number]  Discharge Plan: SNF    Current Diagnoses: Patient Active Problem List   Diagnosis Date Noted   Acute respiratory failure with hypoxia (HCC) 11/23/2022   COVID-19 virus infection 11/22/2022   DVT of popliteal vein (HCC) 11/22/2022   Acute hypoxic respiratory failure (HCC) 11/21/2022   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) 10/30/2022   Chronic blood loss anemia 10/28/2022   Chronic kidney disease, stage 3a (HCC) 10/28/2022   Carotid stenosis, symptomatic, with infarction (HCC) 10/28/2022   Duodenal ulcer with hemorrhage 10/22/2022   Lumbosacral spondylosis without myelopathy 09/16/2022   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   Intermittent claudication (HCC) 02/13/2020   Essential hypertension 12/05/2019   Bilateral carotid artery stenosis 12/26/2018   Diabetes mellitus (HCC) 11/22/2018   Moderate chronic obstructive pulmonary disease (HCC) 07/26/2018   Peripheral arterial disease (HCC) 11/09/2017   Restless legs 11/09/2017   Coronary artery disease 10/06/2017   Mixed hyperlipidemia 08/23/2017   Heart failure, unspecified (HCC) 08/23/2017   Peripheral edema 08/19/2017   Osteopenia 06/26/2015    Orientation RESPIRATION BLADDER Height & Weight     Self, Time, Situation, Place   O2 (3L nasal cannula) Continent Weight: 141 lb 8.6 oz (64.2 kg) Height:  5\' 2"  (157.5 cm)  BEHAVIORAL SYMPTOMS/MOOD NEUROLOGICAL BOWEL NUTRITION STATUS      Continent Diet (see discharge summary)  AMBULATORY STATUS COMMUNICATION OF NEEDS Skin   Extensive Assist Verbally Other (Comment) (close incision neck)                       Personal Care Assistance Level of Assistance  Bathing, Feeding, Dressing, Total care Bathing Assistance: Limited assistance Feeding assistance: Limited assistance Dressing Assistance: Limited assistance Total Care Assistance: Limited assistance   Functional Limitations Info  Sight, Hearing, Speech Sight Info: Adequate Hearing Info: Adequate Speech Info: Adequate    SPECIAL CARE FACTORS FREQUENCY  PT (By licensed PT), OT (By licensed OT)     PT Frequency: min 4x weekly OT Frequency: min 4x weekly            Contractures Contractures Info: Not present    Additional Factors Info  Code Status, Allergies Code Status Info: full Allergies Info: amoxicillin, lisinopril, tramadol, gabapentin, colchicine           Current Medications (11/25/2022):  This is the current hospital active medication list Current Facility-Administered Medications  Medication Dose Route Frequency Provider Last Rate Last Admin   acetaminophen (TYLENOL) tablet 650 mg  650 mg Oral Q6H PRN 11/27/2022, MD   650 mg at 11/23/22 2137   Or   acetaminophen (TYLENOL) suppository 650 mg  650 mg Rectal Q6H PRN 2138, MD       albuterol (VENTOLIN HFA) 108 (90 Base) MCG/ACT inhaler 2 puff  2 puff Inhalation Q4H PRN Gertha Calkin  V, MD       amLODipine (NORVASC) tablet 5 mg  5 mg Oral Daily Irena Cords V, MD   5 mg at 11/25/22 0944   atorvastatin (LIPITOR) tablet 40 mg  40 mg Oral QHS Gertha Calkin, MD   40 mg at 11/24/22 1959   carvedilol (COREG) tablet 25 mg  25 mg Oral BID WC Irena Cords V, MD   25 mg at 11/25/22 0945   furosemide (LASIX) tablet 40 mg  40 mg Oral Daily  Regalado, Belkys A, MD   40 mg at 11/25/22 0945   guaiFENesin-dextromethorphan (ROBITUSSIN DM) 100-10 MG/5ML syrup 5 mL  5 mL Oral Q4H PRN Manuela Schwartz, NP   5 mL at 11/24/22 1955   heparin ADULT infusion 100 units/mL (25000 units/290mL)  850 Units/hr Intravenous Continuous Lowella Bandy, RPH 8.5 mL/hr at 11/24/22 1605 850 Units/hr at 11/24/22 1605   hydrALAZINE (APRESOLINE) injection 10 mg  10 mg Intravenous Q6H PRN Regalado, Belkys A, MD       insulin aspart (novoLOG) injection 0-15 Units  0-15 Units Subcutaneous TID WC Gertha Calkin, MD   2 Units at 11/25/22 0947   insulin glargine-yfgn (SEMGLEE) injection 12 Units  12 Units Subcutaneous Daily Leandro Reasoner Tublu, MD   12 Units at 11/24/22 1302   iohexol (OMNIPAQUE) 350 MG/ML injection 75 mL  75 mL Intravenous Once PRN Gertha Calkin, MD       ipratropium-albuterol (DUONEB) 0.5-2.5 (3) MG/3ML nebulizer solution 3 mL  3 mL Nebulization Q4H PRN Manuela Schwartz, NP       menthol-cetylpyridinium (CEPACOL) lozenge 3 mg  1 lozenge Oral PRN Manuela Schwartz, NP       mometasone-formoterol (DULERA) 200-5 MCG/ACT inhaler 2 puff  2 puff Inhalation BID Gertha Calkin, MD   2 puff at 11/24/22 0947   oxyCODONE-acetaminophen (PERCOCET/ROXICET) 5-325 MG per tablet 1-2 tablet  1-2 tablet Oral Q6H PRN Foust, Katy L, NP   1 tablet at 11/25/22 0944   pantoprazole (PROTONIX) injection 40 mg  40 mg Intravenous Q12H Irena Cords V, MD   40 mg at 11/25/22 0945   predniSONE (DELTASONE) tablet 40 mg  40 mg Oral Q breakfast Regalado, Belkys A, MD   40 mg at 11/25/22 0945   sodium chloride flush (NS) 0.9 % injection 3 mL  3 mL Intravenous Q12H Gertha Calkin, MD   3 mL at 11/25/22 0300     Discharge Medications: Please see discharge summary for a list of discharge medications.  Relevant Imaging Results:  Relevant Lab Results:   Additional Information SSN: [id number]  Darolyn Rua, LCSW

## 2022-11-26 ENCOUNTER — Other Ambulatory Visit (HOSPITAL_COMMUNITY): Payer: Self-pay

## 2022-11-26 DIAGNOSIS — E440 Moderate protein-calorie malnutrition: Secondary | ICD-10-CM | POA: Insufficient documentation

## 2022-11-26 LAB — CBC
HCT: 28.2 % — ABNORMAL LOW (ref 36.0–46.0)
Hemoglobin: 8.8 g/dL — ABNORMAL LOW (ref 12.0–15.0)
MCH: 26.8 pg (ref 26.0–34.0)
MCHC: 31.2 g/dL (ref 30.0–36.0)
MCV: 86 fL (ref 80.0–100.0)
Platelets: 338 10*3/uL (ref 150–400)
RBC: 3.28 MIL/uL — ABNORMAL LOW (ref 3.87–5.11)
RDW: 15.2 % (ref 11.5–15.5)
WBC: 7.9 10*3/uL (ref 4.0–10.5)
nRBC: 0 % (ref 0.0–0.2)

## 2022-11-26 LAB — HEPARIN LEVEL (UNFRACTIONATED): Heparin Unfractionated: 0.65 IU/mL (ref 0.30–0.70)

## 2022-11-26 LAB — GLUCOSE, CAPILLARY
Glucose-Capillary: 197 mg/dL — ABNORMAL HIGH (ref 70–99)
Glucose-Capillary: 299 mg/dL — ABNORMAL HIGH (ref 70–99)
Glucose-Capillary: 279 mg/dL — ABNORMAL HIGH (ref 70–99)
Glucose-Capillary: 293 mg/dL — ABNORMAL HIGH (ref 70–99)

## 2022-11-26 MED ORDER — APIXABAN 5 MG PO TABS
10.0000 mg | ORAL_TABLET | Freq: Two times a day (BID) | ORAL | Status: DC
  Administered 2022-11-26 – 2022-12-01 (×11): 10 mg via ORAL
  Filled 2022-11-26 (×11): qty 2

## 2022-11-26 MED ORDER — APIXABAN 5 MG PO TABS: 5.0000 mg | ORAL_TABLET | Freq: Two times a day (BID) | ORAL | Status: DC

## 2022-11-26 NOTE — Progress Notes (Signed)
PROGRESS NOTE    Kelly Rowland  QQV:[id number] DOB: [birth year] DOA: 11/21/2022 PCP: [place] Bells, MD    Brief Narrative:  81 year old who recently had left carotid endarterectomy and was at a skilled nursing rehab started having cough and shortness of breath so sent to the emergency room.  She was diagnosed with COVID 19 about 2 days ago and was started on oxygen. Recently diagnosed with duodenal ulcer and on PPI. On presentation with her shortness of breath, CT angiogram was suggested but patient declined.  Lower extremity duplex with acute DVT.  Currently on heparin infusion.   Assessment & Plan:   Acute hypoxemic respiratory failure secondary to COVID-19 infection.  No evidence of pneumonia on radiograph.  Currently weaning of oxygen. COVID-19 infection:  Continue chest physiotherapy, incentive spirometry, deep breathing exercises, sputum induction, mucolytic's and bronchodilators. Supplemental oxygen to keep saturations more than 90%.  Wean off to room air as possible. Covid directed therapy with , steroids, completed 5 days of prednisone.  Not indicated further. remdesivir, completed 3 days of remdesivir. antibiotics, not indicated.  Suspected pulmonary embolism, DVT left popliteal vein: Declined CTA.  Duplex positive for acute DVT.  On heparin.  Changed to Eliquis for 3 months.  Monitor closely due to recent history of GI bleed.  Recent duodenal ulcer: No active bleeding.  Now on Eliquis.  Continue Protonix.  Changed to oral on discharge.  Type 2 diabetes, well-controlled with hyperglycemia: Expect to worsen with steroid use.  Monitor and keep on insulin.  Essential hypertension low blood pressure stable on carvedilol, amlodipine and Lasix.  Peripheral vascular disease: Iliac stent 2003 Left carotid endarterectomy 11/17/2022 On aspirin.  Currently started on heparin and subsequent Eliquis.  Discontinue aspirin.  Continue statin.  CKD stage IIIb: At about her usual  levels.   DVT prophylaxis:  apixaban (ELIQUIS) tablet 10 mg  apixaban (ELIQUIS) tablet 5 mg   Code Status: Full code Family Communication: None at the bedside.  She is talking to her son. Disposition Plan: Status is: Inpatient Remains inpatient appropriate because: Unsafe discharge disposition plan. Medically stabilizing to transfer to skilled level of care when available. Can transfer from progressive to MedSurg bed.     Consultants:  None  Procedures:  None  Antimicrobials:  Remdesivir, completed   Subjective: Patient was seen and examined.  Denies any complaints.  Occasional cough.  She does not want to go back to same nursing home but she is open to other nursing home referrals.  Objective: Vitals:   11/26/22 0515 11/26/22 0525 11/26/22 0828 11/26/22 1235  BP: (!) 168/62 (!) 158/67 (!) 168/60 (!) 160/59  Pulse: 71  65 76  Resp: 19  20 17   Temp: 97.8 F (36.6 C)  98.9 F (37.2 C) 98.2 F (36.8 C)  TempSrc:   Oral   SpO2: 90% 96% 97% 95%  Weight:  63.4 kg    Height:        Intake/Output Summary (Last 24 hours) at 11/26/2022 1353 Last data filed at 11/26/2022 0833 Gross per 24 hour  Intake 434.48 ml  Output 2550 ml  Net -2115.52 ml   Filed Weights   11/21/22 1854 11/25/22 0333 11/26/22 0525  Weight: 68 kg 64.2 kg 63.4 kg    Examination:  General exam: Appears calm and comfortable, slightly anxious. Respiratory system: Clear to auscultation. Respiratory effort normal.  No added sounds. SpO2: 95 % O2 Flow Rate (L/min): 3 L/min  Cardiovascular system: S1 & S2 heard, RRR. No pedal edema. Gastrointestinal  system: Abdomen is nondistended, soft and nontender. No organomegaly or masses felt. Normal bowel sounds heard. Central nervous system: Alert and oriented. No focal neurological deficits. Extremities: Symmetric 5 x 5 power. Skin: No rashes, lesions or ulcers Psychiatry: Judgement and insight appear normal. Mood & affect appropriate.     Data  Reviewed: I have personally reviewed following labs and imaging studies  CBC: Recent Labs  Lab 11/21/22 1903 11/22/22 0143 11/24/22 0455 11/25/22 0334 11/26/22 0347  WBC 4.6 4.5 6.1 5.7 7.9  NEUTROABS 3.4  --   --   --   --   HGB 7.6* 8.3* 8.8* 8.8* 8.8*  HCT 24.6* 27.1* 28.6* 28.7* 28.2*  MCV 89.1 89.4 86.7 86.4 86.0  PLT 236 241 372 360 338   Basic Metabolic Panel: Recent Labs  Lab 11/21/22 1903 11/22/22 0143 11/24/22 0455 11/25/22 0334  NA 134* 136 136 138  K 3.4* 3.3* 2.9* 3.7  CL 98 99 96* 99  CO2 28 31 30  33*  GLUCOSE 227* 209* 207* 176*  BUN 13 9 19 17   CREATININE 0.84 0.67 0.90 0.84  CALCIUM 8.0* 8.1* 8.0* 8.0*   GFR: Estimated Creatinine Clearance: 47.5 mL/min (by C-G formula based on SCr of 0.84 mg/dL). Liver Function Tests: Recent Labs  Lab 11/21/22 1903 11/22/22 0143  AST 16 16  ALT 13 12  ALKPHOS 80 82  BILITOT 0.6 0.5  PROT 5.2* 5.5*  ALBUMIN 2.3* 2.3*   No results for input(s): "LIPASE", "AMYLASE" in the last 168 hours. No results for input(s): "AMMONIA" in the last 168 hours. Coagulation Profile: Recent Labs  Lab 11/22/22 1540  INR 1.1   Cardiac Enzymes: No results for input(s): "CKTOTAL", "CKMB", "CKMBINDEX", "TROPONINI" in the last 168 hours. BNP (last 3 results) No results for input(s): "PROBNP" in the last 8760 hours. HbA1C: No results for input(s): "HGBA1C" in the last 72 hours. CBG: Recent Labs  Lab 11/25/22 1224 11/25/22 1612 11/25/22 2046 11/26/22 0829 11/26/22 1233  GLUCAP 207* 276* 271* 197* 299*   Lipid Profile: No results for input(s): "CHOL", "HDL", "LDLCALC", "TRIG", "CHOLHDL", "LDLDIRECT" in the last 72 hours. Thyroid Function Tests: No results for input(s): "TSH", "T4TOTAL", "FREET4", "T3FREE", "THYROIDAB" in the last 72 hours. Anemia Panel: No results for input(s): "VITAMINB12", "FOLATE", "FERRITIN", "TIBC", "IRON", "RETICCTPCT" in the last 72 hours. Sepsis Labs: Recent Labs  Lab 11/22/22 0535   PROCALCITON <0.10    Recent Results (from the past 240 hour(s))  MRSA Next Gen by PCR, Nasal     Status: None   Collection Time: 11/16/22  2:32 PM   Specimen: Nasal Mucosa; Nasal Swab  Result Value Ref Range Status   MRSA by PCR Next Gen NOT DETECTED NOT DETECTED Final    Comment: (NOTE) The GeneXpert MRSA Assay (FDA approved for NASAL specimens only), is one component of a comprehensive MRSA colonization surveillance program. It is not intended to diagnose MRSA infection nor to guide or monitor treatment for MRSA infections. Test performance is not FDA approved in patients less than 60 years old. Performed at [facility], [address] [place], [address] [place]   Resp panel by RT-PCR (RSV, Flu A&B, Covid) Anterior Nasal Swab     Status: Abnormal   Collection Time: 11/21/22  7:17 PM   Specimen: Anterior Nasal Swab  Result Value Ref Range Status   SARS Coronavirus 2 by RT PCR POSITIVE (A) NEGATIVE Final    Comment: (NOTE) SARS-CoV-2 target nucleic acids are DETECTED.  The SARS-CoV-2 RNA is  generally detectable in upper respiratory specimens during the acute phase of infection. Positive results are indicative of the presence of the identified virus, but do not rule out bacterial infection or co-infection with other pathogens not detected by the test. Clinical correlation with patient history and other diagnostic information is necessary to determine patient infection status. The expected result is Negative.  Fact Sheet for Patients: BloggerCourse.com  Fact Sheet for Healthcare Providers: SeriousBroker.it  This test is not yet approved or cleared by the [place] FDA and  has been authorized for detection and/or diagnosis of SARS-CoV-2 by FDA under an Emergency Use Authorization (EUA).  This EUA will remain in effect (meaning this test can be used) for the duration of  the COVID-19 declaration under Section  564(b)(1) of the A ct, 21 U.S.C. section 360bbb-3(b)(1), unless the authorization is terminated or revoked sooner.     Influenza A by PCR NEGATIVE NEGATIVE Final   Influenza B by PCR NEGATIVE NEGATIVE Final    Comment: (NOTE) The Xpert Xpress SARS-CoV-2/FLU/RSV plus assay is intended as an aid in the diagnosis of influenza from Nasopharyngeal swab specimens and should not be used as a sole basis for treatment. Nasal washings and aspirates are unacceptable for Xpert Xpress SARS-CoV-2/FLU/RSV testing.  Fact Sheet for Patients: BloggerCourse.com  Fact Sheet for Healthcare Providers: SeriousBroker.it  This test is not yet approved or cleared by the [place] FDA and has been authorized for detection and/or diagnosis of SARS-CoV-2 by FDA under an Emergency Use Authorization (EUA). This EUA will remain in effect (meaning this test can be used) for the duration of the COVID-19 declaration under Section 564(b)(1) of the Act, 21 U.S.C. section 360bbb-3(b)(1), unless the authorization is terminated or revoked.     Resp Syncytial Virus by PCR NEGATIVE NEGATIVE Final    Comment: (NOTE) Fact Sheet for Patients: BloggerCourse.com  Fact Sheet for Healthcare Providers: SeriousBroker.it  This test is not yet approved or cleared by the [place] FDA and has been authorized for detection and/or diagnosis of SARS-CoV-2 by FDA under an Emergency Use Authorization (EUA). This EUA will remain in effect (meaning this test can be used) for the duration of the COVID-19 declaration under Section 564(b)(1) of the Act, 21 U.S.C. section 360bbb-3(b)(1), unless the authorization is terminated or revoked.  Performed at [facility], [address] [place], [place] [address]          Radiology Studies: No results found.      Scheduled Meds:  amLODipine  5 mg Oral Daily    apixaban  10 mg Oral BID   Followed by   Melene Muller ON 12/03/2022] apixaban  5 mg Oral BID   atorvastatin  40 mg Oral QHS   carvedilol  25 mg Oral BID WC   feeding supplement (GLUCERNA SHAKE)  237 mL Oral BID BM   furosemide  40 mg Oral Daily   insulin aspart  0-15 Units Subcutaneous TID WC   insulin glargine-yfgn  12 Units Subcutaneous Daily   mometasone-formoterol  2 puff Inhalation BID   multivitamin with minerals  1 tablet Oral Daily   pantoprazole (PROTONIX) IV  40 mg Intravenous Q12H   polyethylene glycol  17 g Oral Daily   Ensure Max Protein  11 oz Oral QHS   sodium chloride flush  3 mL Intravenous Q12H   Continuous Infusions:   LOS: 5 days    Time spent: 35 minutes    Dorcas Carrow, MD [facility] Pager [phone]

## 2022-11-26 NOTE — Progress Notes (Signed)
ANTICOAGULATION CONSULT NOTE  Pharmacy Consult for Eliquis Indication: VTE Treatment  Allergies  Allergen Reactions   Amoxicillin Anaphylaxis   Lisinopril Anaphylaxis   Tramadol Nausea Only   Colchicine Nausea Only    Patient Measurements: Height: 5\' 2"  (157.5 cm) Weight: 63.4 kg (139 lb 12.4 oz) IBW/kg (Calculated) : 50.1 Heparin Dosing Weight: 68 kg  Vital Signs: Temp: 98.9 F (37.2 C) (12/14 0828) Temp Source: Oral (12/14 0828) BP: 168/60 (12/14 0828) Pulse Rate: 65 (12/14 0828)  Labs: Recent Labs    11/24/22 0455 11/24/22 1332 11/25/22 0334 11/26/22 0347  HGB 8.8*  --  8.8* 8.8*  HCT 28.6*  --  28.7* 28.2*  PLT 372  --  360 338  HEPARINUNFRC 0.64 0.58 0.53 0.65  CREATININE 0.90  --  0.84  --     Estimated Creatinine Clearance: 47.5 mL/min (by C-G formula based on SCr of 0.84 mg/dL).  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 PMH includes extensive history of acute blood loss anemia from GI bleed/ulcer (GI bleed 11/04/2022). Venous ultrasound showing DVT in right peroneal vein. Also with respiratory distress on 12/10, though patient is refusing CTA.  Patient placed on heparin infusion. Pharmacy consulted on 12/14 to transition from heparin infusion to Eliquis fro DVT/PE treatment.  Hgb 7-8 at baseline (though  unclear if GI oozing/hemorrhage contributory to low baseline Hgb). aPTT 30s and INR 1.1  Plan:  Noted hgb stable at 8.8, plt 338, no s/sx of active bleeding Stop heparin infusion Start Eliquis 5mg  BID x 7 days, then Eliquis 5mg  daily  Nattaly Yebra Rodriguez-Guzman PharmD, BCPS 11/26/2022 12:05 PM

## 2022-11-26 NOTE — TOC Progression Note (Signed)
Transition of Care [facility]) - Progression Note    Patient Details  Name: Anwita Mencer MRN: [id number] Date of Birth: 19-Jun-1941  Transition of [facility]) CM/SW Contact  Truddie Hidden, RN Phone Number: 11/26/2022, 1:32 PM  Clinical Narrative:    Spoke with patient to give bed offer for [facility] she would prefer to go to [facility] in [place] or [facility] in [place]. She has been advised she would have to quarantine for 10 days prior to her transfer to a facility as well as be approved for insurance.   Contacted Tammy at [facility] regarding message in HUB from admissions. Tammy was advised patient came from [facility] but does not wish to return. Tammy was advised of patient request for facility and patient's COVID status. She will check remaining medicare days for  patient and update this RNCM.    Expected Discharge Plan: Skilled Nursing Facility Barriers to Discharge: Continued Medical Work up  Expected Discharge Plan and Services Expected Discharge Plan: Skilled Nursing Facility                                               Social Determinants of Health (SDOH) Interventions    Readmission Risk Interventions    11/17/2022   10:57 AM  Readmission Risk Prevention Plan  Transportation Screening Complete  PCP or Specialist Appt within 3-5 Days Complete  HRI or Home Care Consult Complete  Social Work Consult for Recovery Care Planning/Counseling Complete  Palliative Care Screening Not Applicable  Medication Review Oceanographer) Complete

## 2022-11-26 NOTE — TOC Benefit Eligibility Note (Signed)
Patient Product/process development scientist completed.    The patient is currently admitted and upon discharge could be taking Eliquis 5 mg.  The current 30 day co-pay is $156.73 due to a $109.73 deductible remaining.   The patient is insured through [employer] Medicare Part D   Roland Earl, CPHT Pharmacy Patient Advocate Specialist [facility] Patient Advocate Team Direct Number: [phone]  Fax: [phone]

## 2022-11-26 NOTE — Progress Notes (Signed)
Physical Therapy Treatment Patient Details Name: Kelly Rowland MRN: [id number] DOB: 05-Jul-1941 Today's Date: 11/26/2022   History of Present Illness Pt is a 81 year old with past medical history significant for CVA, DM, CKD 3a, hypertension, GI bleed from duodenal ulcer with chronic blood loss anemia, recent [place] endarte[place] 11/17/2022 who was admitted 11/21/2022 with shortness of breath.  MD assessment includes: COVID pneumonia, mild interstitial pulmonary edema small bilateral pleural effusion, moderate emphysema, DVT with pt started on heparin drip, acute hypoxic respiratory failure, and hypokalemia.    PT Comments    Pt resting in bed upon PT arrival; agreeable to PT session.  Pt's O2 sats 91% or greater with activity but 93% at rest (all on 3 L O2 via nasal cannula).  Pt reporting chronic LBP (6/10) and declining pain meds during session.  Bed mobility CGA to min assist; transfers with min to mod assist using RW; and pt able to take steps in place with RW min assist (limited d/t B LE weakness--B knee's flexing with fatigue).  Posterior lean noted in standing requiring intermittent assist and cueing for balance.  Will continue to focus on strengthening, balance, and progressive functional mobility during hospitalization.     Recommendations for follow up therapy are one component of a multi-disciplinary discharge planning process, led by the attending physician.  Recommendations may be updated based on patient status, additional functional criteria and insurance authorization.  Follow Up Recommendations  Skilled nursing-short term rehab (<3 hours/day) Can patient physically be transported by private vehicle: No   Assistance Recommended at Discharge Frequent or constant Supervision/Assistance  Patient can return home with the following A lot of help with walking and/or transfers;A lot of help with bathing/dressing/bathroom;Assistance with cooking/housework;Direct supervision/assist  for medications management;Direct supervision/assist for financial management;Assist for transportation;Help with stairs or ramp for entrance   Equipment Recommendations  Rolling walker (2 wheels);BSC/3in1;Wheelchair (measurements PT);Wheelchair cushion (measurements PT);Hospital bed    Recommendations for Other Services       Precautions / Restrictions Precautions Precautions: Fall Restrictions Weight Bearing Restrictions: No Other Position/Activity Restrictions: Covid +     Mobility  Bed Mobility Overal bed mobility: Needs Assistance Bed Mobility: Supine to Sit, Sit to Supine     Supine to sit: Min guard (CGA for safety/lines) Sit to supine: Min assist (assist for B LE's)   General bed mobility comments: increased effort/time to perform as much as possible on own    Transfers Overall transfer level: Needs assistance Equipment used: Rolling walker (2 wheels) Transfers: Sit to/from Stand Sit to Stand: Min assist, Mod assist           General transfer comment: x2 trials standing; assist to initiate stand and control descent sitting; assist for balance d/t posterior lean    Ambulation/Gait Ambulation/Gait assistance: Min assist Gait Distance (Feet):  (pt took x5 steps in place B LE's, 2 steps in place B LE's (pt then required sitting rest break d/t fatigue/weakness), and then x5 steps in place B LE's; prior to sitting down again pt able to sidestep to L along bed a couple feet) Assistive device: Rolling walker (2 wheels)   Gait velocity: decreased     General Gait Details: assist for balance (posterior lean); B knee flexion noted with fatigue   Stairs             Wheelchair Mobility    Modified Rankin (Stroke Patients Only)       Balance Overall balance assessment: Needs assistance Sitting-balance support: No upper extremity supported, Feet supported  Sitting balance-Leahy Scale: Good Sitting balance - Comments: steady sitting reaching within  BOS Postural control: Posterior lean Standing balance support: Bilateral upper extremity supported, During functional activity, Reliant on assistive device for balance Standing balance-Leahy Scale: Poor Standing balance comment: intermittent assist for balance d/t posterior lean requiring vc's and assist to correct                            Cognition Arousal/Alertness: Awake/alert Behavior During Therapy: WFL for tasks assessed/performed Overall Cognitive Status: Within Functional Limits for tasks assessed                                 General Comments: A&O x4        Exercises      General Comments  Nursing cleared pt for participation in physical therapy.  Pt agreeable to PT session.      Pertinent Vitals/Pain Pain Assessment Pain Assessment: 0-10 Pain Score: 6  Pain Location: chronic LBP Pain Descriptors / Indicators: Aching, Sore, Discomfort Pain Intervention(s): Limited activity within patient's tolerance, Monitored during session, Repositioned (pt declined pain meds) HR WFL during sessions activities.    Home Living                          Prior Function            PT Goals (current goals can now be found in the care plan section) Acute Rehab PT Goals Patient Stated Goal: To get stronger PT Goal Formulation: With patient Time For Goal Achievement: 12/07/22 Potential to Achieve Goals: Good Progress towards PT goals: Progressing toward goals    Frequency    Min 2X/week      PT Plan Current plan remains appropriate    Co-evaluation              AM-PAC PT "6 Clicks" Mobility   Outcome Measure  Help needed turning from your back to your side while in a flat bed without using bedrails?: A Little Help needed moving from lying on your back to sitting on the side of a flat bed without using bedrails?: A Little Help needed moving to and from a bed to a chair (including a wheelchair)?: A Lot Help needed standing  up from a chair using your arms (e.g., wheelchair or bedside chair)?: A Lot Help needed to walk in hospital room?: A Lot Help needed climbing 3-5 steps with a railing? : Total 6 Click Score: 13    End of Session Equipment Utilized During Treatment: Oxygen;Gait belt (3 L via nasal cannula) Activity Tolerance: Patient limited by fatigue Patient left: in bed;with call bell/phone within reach;with bed alarm set;Other (comment) (B heels floating via pillow support) Nurse Communication: Mobility status;Precautions;Other (comment) (pt needing IV pole to allow for progressive mobility (IV machine attached to pt's bed)) PT Visit Diagnosis: Muscle weakness (generalized) (M62.81);Difficulty in walking, not elsewhere classified (R26.2);History of falling (Z91.81);Unsteadiness on feet (R26.81) Pain - Right/Left:  (chronic LBP)     Time: [id number] PT Time Calculation (min) (ACUTE ONLY): 33 min  Charges:  $Gait Training: 8-22 mins $Therapeutic Activity: 8-22 mins                    Leitha Bleak, PT 11/26/22, 11:09 AM

## 2022-11-26 NOTE — Plan of Care (Signed)

## 2022-11-26 NOTE — Evaluation (Signed)
Occupational Therapy Evaluation Patient Details Name: Kelly Rowland MRN: [id number] DOB: 01-14-1941 Today's Date: 11/26/2022   History of Present Illness Pt is a 81 year old with past medical history significant for CVA, DM, CKD 3a, hypertension, GI bleed from duodenal ulcer with chronic blood loss anemia, recent [place]-state endarterectomy 11/17/2022 who was admitted 11/21/2022 with shortness of breath.  MD assessment includes: COVID pneumonia, mild interstitial pulmonary edema small bilateral pleural effusion, moderate emphysema, DVT with pt started on heparin drip, acute hypoxic respiratory failure, and hypokalemia.   Clinical Impression   Patient presenting with decreased Ind in self care,balance, functional mobility/transfers,endurance, and safety awareness. Patient reports being mod I at baseline until recent hospitalization and SNF stay. Pt now with covid from facility and remains motivated to return home.  Patient currently functioning at min A for bed mobility secondary to increased back pain. Pt sitting for grooming tasks with set up A to obtain needed items . Pt stands with mod HHA and ambulates forward to sink and stands for ~ 1 minute. Pt then taking several steps back and side stepping along bed secondary to endorsing feeling very weak. Pt remains on 3Ls O2 via [place].  Patient will benefit from acute OT to increase overall independence in the areas of ADLs, functional mobility, and safety awareness  in order to safely discharge to next venue of care.      Recommendations for follow up therapy are one component of a multi-disciplinary discharge planning process, led by the attending physician.  Recommendations may be updated based on patient status, additional functional criteria and insurance authorization.   Follow Up Recommendations  Skilled nursing-short term rehab (<3 hours/day)     Assistance Recommended at Discharge Frequent or constant Supervision/Assistance  Patient can return  home with the following A lot of help with bathing/dressing/bathroom;A lot of help with walking and/or transfers;Assistance with cooking/housework;Assist for transportation;Help with stairs or ramp for entrance;Assistance with feeding;Direct supervision/assist for medications management;Direct supervision/assist for financial management    Functional Status Assessment  Patient has had a recent decline in their functional status and demonstrates the ability to make significant improvements in function in a reasonable and predictable amount of time.  Equipment Recommendations  Other (comment) (defer to next venue of care)       Precautions / Restrictions Precautions Precautions: Fall Restrictions Weight Bearing Restrictions: No      Mobility Bed Mobility Overal bed mobility: Needs Assistance Bed Mobility: Supine to Sit, Sit to Supine     Supine to sit: Min assist Sit to supine: Min assist        Transfers Overall transfer level: Needs assistance Equipment used: 1 person hand held assist Transfers: Sit to/from Stand Sit to Stand: Mod assist                  Balance Overall balance assessment: Needs assistance Sitting-balance support: No upper extremity supported, Feet supported Sitting balance-Leahy Scale: Good   Postural control: Posterior lean Standing balance support: Bilateral upper extremity supported, During functional activity Standing balance-Leahy Scale: Poor                             ADL either performed or assessed with clinical judgement   ADL Overall ADL's : Needs assistance/impaired     Grooming: Sitting;Min guard;Wash/dry hands;Wash/dry Scientist, research (physical sciences): Moderate assistance Toilet Transfer Details (indicate cue  type and reason): simulated hand held                 Vision Baseline Vision/History: 1 Wears glasses Patient Visual Report: No change from baseline              Pertinent  Vitals/Pain Pain Assessment Pain Assessment: 0-10 Pain Score: 6  Pain Location: chronic LBP Pain Descriptors / Indicators: Aching, Sore, Discomfort Pain Intervention(s): Limited activity within patient's tolerance, Monitored during session, Repositioned, Patient requesting pain meds-RN notified     Hand Dominance Right   Extremity/Trunk Assessment Upper Extremity Assessment Upper Extremity Assessment: Generalized weakness   Lower Extremity Assessment Lower Extremity Assessment: Generalized weakness       Communication Communication Communication: No difficulties   Cognition Arousal/Alertness: Awake/alert Behavior During Therapy: WFL for tasks assessed/performed Overall Cognitive Status: Within Functional Limits for tasks assessed                                 General Comments: A&O x4                Home Living Family/patient expects to be discharged to:: Private residence Living Arrangements: Alone Available Help at Discharge: Family;Available PRN/intermittently Type of Home: Apartment Home Access: Level entry     Home Layout: One level     Bathroom Shower/Tub: Tub/shower unit         Home Equipment: Rollator (4 wheels);Cane - single point;Shower seat;Grab bars - tub/shower;Grab bars - toilet          Prior Functioning/Environment Prior Level of Function : Driving;Independent/Modified Independent             Mobility Comments: Mod Ind amb with a rollator at home and with a SPC in the community, 2-3 falls in the last year, drives ADLs Comments: Ind with ADLs and IADLs        OT Problem List: Decreased strength;Decreased range of motion;Decreased activity tolerance;Impaired UE functional use;Impaired balance (sitting and/or standing);Decreased coordination;Decreased knowledge of use of DME or AE;Cardiopulmonary status limiting activity;Pain;Decreased cognition;Decreased safety awareness      OT Treatment/Interventions:  Self-care/ADL training;Therapeutic exercise;Therapeutic activities;Cognitive remediation/compensation;DME and/or AE instruction;Patient/family education;Balance training;Energy conservation    OT Goals(Current goals can be found in the care plan section) Acute Rehab OT Goals Patient Stated Goal: to get stronger a go to rehab OT Goal Formulation: With patient Time For Goal Achievement: 12/10/22 Potential to Achieve Goals: Good ADL Goals Pt Will Perform Upper Body Bathing: with supervision;sitting Pt Will Perform Lower Body Dressing: with min guard assist;sit to/from stand Pt Will Transfer to Toilet: with min guard assist;ambulating Pt Will Perform Toileting - Clothing Manipulation and hygiene: with min guard assist;sit to/from stand  OT Frequency: Min 2X/week       AM-PAC OT "6 Clicks" Daily Activity     Outcome Measure Help from another person eating meals?: None Help from another person taking care of personal grooming?: A Little Help from another person toileting, which includes using toliet, bedpan, or urinal?: A Lot Help from another person bathing (including washing, rinsing, drying)?: A Lot Help from another person to put on and taking off regular upper body clothing?: A Little Help from another person to put on and taking off regular lower body clothing?: A Lot 6 Click Score: 16   End of Session Equipment Utilized During Treatment: Oxygen (3Ls) Nurse Communication: Mobility status;Patient requests pain meds  Activity Tolerance: Patient limited by pain Patient  left: in bed;with call bell/phone within reach;with bed alarm set  OT Visit Diagnosis: Unsteadiness on feet (R26.81);Repeated falls (R29.6);Muscle weakness (generalized) (M62.81);Pain;Other symptoms and signs involving the nervous system (R29.898) Pain - part of body:  (back)                Time: 4142-3953 OT Time Calculation (min): 22 min Charges:  OT General Charges $OT Visit: 1 Visit OT Evaluation $OT Eval  Moderate Complexity: 1 Mod OT Treatments $Therapeutic Activity: 8-22 mins  Jackquline Denmark, MS, OTR/L , CBIS ascom [phone]  11/26/22, 3:45 PM

## 2022-11-26 NOTE — Progress Notes (Signed)
ANTICOAGULATION CONSULT NOTE  Pharmacy Consult for IV heparin  Indication: VTE Treatment  Allergies  Allergen Reactions   Amoxicillin Anaphylaxis   Lisinopril Anaphylaxis   Tramadol Nausea Only   Colchicine Nausea Only    Patient Measurements: Height: 5\' 2"  (157.5 cm) Weight: 64.2 kg (141 lb 8.6 oz) IBW/kg (Calculated) : 50.1 Heparin Dosing Weight: 68 kg  Vital Signs: Temp: 97.8 F (36.6 C) (12/14 0515) Temp Source: Oral (12/13 2330) BP: 168/62 (12/14 0515) Pulse Rate: 71 (12/14 0515)  Labs: Recent Labs    11/24/22 0455 11/24/22 1332 11/25/22 0334 11/26/22 0347  HGB 8.8*  --  8.8* 8.8*  HCT 28.6*  --  28.7* 28.2*  PLT 372  --  360 338  HEPARINUNFRC 0.64 0.58 0.53 0.65  CREATININE 0.90  --  0.84  --      Estimated Creatinine Clearance: 47.8 mL/min (by C-G formula based on SCr of 0.84 mg/dL).   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 PMH includes extensive history of acute blood loss anemia from GI bleed/ulcer (GI bleed 11/04/2022). Venous ultrasound showing DVT in right peroneal vein. Also with respiratory distress on 12/10, though patient is refusing CTA. Pharmacy has been consulted to manage heparin for new DVT / possible PE.  Hgb 7-8 at baseline (though unclear if GI oozing/hemorrhage contributory to low  baseline Hgb). aPTT 30s and INR 1.1  Goal of Therapy:  Heparin level 0.3-0.7 units/ml Monitor platelets by anticoagulation protocol: Yes **No bolus protocol**  No boluses due to extensive GI bleed history / low hemoglobin present this admission.  Plan: heparin level therapeutic x 4 Continue heparin infusion at 850 units/hr Recheck heparin level 12/15 with AM labs CBC daily. Monitor H&H closely.  14/10, PharmD, [facility] 11/26/2022 5:17 AM

## 2022-11-26 NOTE — Progress Notes (Signed)
PROGRESS NOTE    Kelly Rowland  ZOX:[id number]  DOB: 1941/05/07  DOA: 11/21/2022 PCP: Kelly Bells, MD Outpatient Specialists:   Hospital course:  56 female with history of CVA, HTN, GI bleed from duodenal ulcers and recent carotid endarterectomy on 11/17/2022 was admitted 11/21/2022 with shortness of breath.  Workup was noted COVID PCR positive, mild interstitial pulmonary edema with small bilateral pleural effusions and moderate emphysema.  Patient has no previous diagnosis of COPD.  Patient declined to have CT angiogram however duplex Dopplers were positive for DVT in right popliteal vein and patient was started on heparin drip.  She was treated with 1 dose of Lasix.  Subjective:  Patient states she is feeling better, notes "I can breathe now, I can move my body although I am still weak".    Objective: Vitals:   11/26/22 0515 11/26/22 0525 11/26/22 0828 11/26/22 1235  BP: (!) 168/62 (!) 158/67 (!) 168/60 (!) 160/59  Pulse: 71  65 76  Resp: 19  20 17   Temp: 97.8 F (36.6 C)  98.9 F (37.2 C) 98.2 F (36.8 C)  TempSrc:   Oral   SpO2: 90% 96% 97% 95%  Weight:  63.4 kg    Height:        Intake/Output Summary (Last 24 hours) at 11/26/2022 1305 Last data filed at 11/26/2022 11/28/2022 Gross per 24 hour  Intake 554.48 ml  Output 2550 ml  Net -1995.52 ml    Filed Weights   11/21/22 1854 11/25/22 0333 11/26/22 0525  Weight: 68 kg 64.2 kg 63.4 kg     Exam:  General: Chronically ill-appearing female sitting up in bed with normal respiratory effort, tachypnea resolved Eyes: sclera anicteric, conjuctiva mild injection bilaterally CVS: S1-S2, regular  Respiratory:  decreased air entry bilaterally secondary to decreased inspiratory effort, rales at bases  GI: NABS, soft, NT  LE: Warm and well-perfused Neuro: A/O x 3,  grossly nonfocal.  Psych: patient is logical and coherent, judgement and insight appear normal, mood and affect appropriate to situation.  Data  Reviewed:  Basic Metabolic Panel: Recent Labs  Lab 11/21/22 1903 11/22/22 0143 11/24/22 0455 11/25/22 0334  NA 134* 136 136 138  K 3.4* 3.3* 2.9* 3.7  CL 98 99 96* 99  CO2 28 31 30  33*  GLUCOSE 227* 209* 207* 176*  BUN 13 9 19 17   CREATININE 0.84 0.67 0.90 0.84  CALCIUM 8.0* 8.1* 8.0* 8.0*     CBC: Recent Labs  Lab 11/21/22 1903 11/22/22 0143 11/24/22 0455 11/25/22 0334 11/26/22 0347  WBC 4.6 4.5 6.1 5.7 7.9  NEUTROABS 3.4  --   --   --   --   HGB 7.6* 8.3* 8.8* 8.8* 8.8*  HCT 24.6* 27.1* 28.6* 28.7* 28.2*  MCV 89.1 89.4 86.7 86.4 86.0  PLT 236 241 372 360 338      Scheduled Meds:  amLODipine  5 mg Oral Daily   apixaban  10 mg Oral BID   Followed by   14/12/23 ON 12/03/2022] apixaban  5 mg Oral BID   atorvastatin  40 mg Oral QHS   carvedilol  25 mg Oral BID WC   feeding supplement (GLUCERNA SHAKE)  237 mL Oral BID BM   furosemide  40 mg Oral Daily   insulin aspart  0-15 Units Subcutaneous TID WC   insulin glargine-yfgn  12 Units Subcutaneous Daily   mometasone-formoterol  2 puff Inhalation BID   multivitamin with minerals  1 tablet Oral Daily   pantoprazole (  PROTONIX) IV  40 mg Intravenous Q12H   polyethylene glycol  17 g Oral Daily   Ensure Max Protein  11 oz Oral QHS   sodium chloride flush  3 mL Intravenous Q12H   Continuous Infusions:     Assessment & Plan:   Acute hypoxic respiratory failure  Thought to be secondary to COVID-19 infection and possible PE She is improving with treatment and oxygen has been weaned down  COVID-19 infection Patient is doing well on remdesivir and prednisone  DVT/VTE Declined CTA so we do not know if she had a PE or not However she does have a DVT and popliteal vein Patient is on heparin drip however has history of GI bleed in past--H&H have remained stable since heparin was started  H/o GI bleed in November 2023 Patient was admitted 2 months ago with bleeding all PUD During that admission she received 3 units  PRBC To follow H&H very closely as she is now on heparin drip--H&H have remained stable since heparin was started Continue IV pantoprazole  DM2 Continue present management  HFpEF Echocardiogram without any wall motion abnormalities, normal EF She did get 1 dose of Lasix for possible mild interstitial edema Patient is on Lasix 20 mg daily however this is being held, can be restarted if warranted  HTN Continue carvedilol, amlodipine and Lasix  Copied from Dr. Latina Rowland note Peripheral arterial disease [facility]) Patient being closely followed by vascular team with Kelly Rowland. B/L Iliac stents in 2003, Left SES to SFA via left antegrade stick 08/2017 . Patient recently had left carotid endarterectomy on 11/17/2022 Continue patient on her statin therapy. Holding aspirin as we are giving her a trial with heparin infusion    Coronary artery disease No chest pain. Cont statin therapy. Hold  antiplatelet therapy.    Chronic kidney disease, stage 3a ([place]) Renal function seems stable. -Monitor renal function   Restless legs Secondary to combination of iron deficiency anemia, degenerative disc disease and arthritis, PAD.    Studies: No results found.  Principal Problem:   Acute hypoxic respiratory failure (HCC) Active Problems:   COVID-19 virus infection   DVT of popliteal vein (HCC)   Chronic blood loss anemia   Duodenal ulcer with hemorrhage   Peripheral arterial disease (HCC)   Heart failure, unspecified (HCC)   Essential hypertension   Diabetes mellitus (HCC)   Intermittent claudication (HCC)   Coronary artery disease   Chronic kidney disease, stage 3a ([place])   Restless legs   Acute respiratory failure with hypoxia ([place])     Kelly Rowland [name], [facility]  If 7PM-7AM, please contact night-coverage [email or web address]   LOS: 5 days

## 2022-11-27 DIAGNOSIS — J9601 Acute respiratory failure with hypoxia: Secondary | ICD-10-CM | POA: Diagnosis not present

## 2022-11-27 LAB — GLUCOSE, CAPILLARY
Glucose-Capillary: 176 mg/dL — ABNORMAL HIGH (ref 70–99)
Glucose-Capillary: 237 mg/dL — ABNORMAL HIGH (ref 70–99)
Glucose-Capillary: 125 mg/dL — ABNORMAL HIGH (ref 70–99)
Glucose-Capillary: 127 mg/dL — ABNORMAL HIGH (ref 70–99)

## 2022-11-27 LAB — CBC
HCT: 28.9 % — ABNORMAL LOW (ref 36.0–46.0)
Hemoglobin: 8.9 g/dL — ABNORMAL LOW (ref 12.0–15.0)
MCH: 26.4 pg (ref 26.0–34.0)
MCHC: 30.8 g/dL (ref 30.0–36.0)
MCV: 85.8 fL (ref 80.0–100.0)
Platelets: 336 10*3/uL (ref 150–400)
RBC: 3.37 MIL/uL — ABNORMAL LOW (ref 3.87–5.11)
RDW: 15 % (ref 11.5–15.5)
WBC: 9 10*3/uL (ref 4.0–10.5)
nRBC: 0 % (ref 0.0–0.2)

## 2022-11-27 MED ORDER — PANTOPRAZOLE SODIUM 40 MG PO TBEC
40.0000 mg | DELAYED_RELEASE_TABLET | Freq: Two times a day (BID) | ORAL | Status: DC
  Administered 2022-11-28 – 2022-12-01 (×8): 40 mg via ORAL
  Filled 2022-11-27 (×8): qty 1

## 2022-11-27 NOTE — TOC Progression Note (Addendum)
Transition of Care [facility]) - Progression Note    Patient Details  Name: Kelly Rowland MRN: [id number] Date of Birth: September 21, 1941  [facility] CM/SW Contact  Liliana Cline, LCSW Phone Number: 11/27/2022, 7:57 AM  Clinical Narrative:    CSW reached out to [facility] to inquire if they are able to make a bed offer.   10:55-  [facility] is able to accept patient. Patient notified. Plan for [facility] on Tuesday when COVID quarantine is over.   Expected Discharge Plan: Skilled Nursing Facility Barriers to Discharge: Continued Medical Work up  Expected Discharge Plan and Services Expected Discharge Plan: Skilled Nursing Facility                                               Social Determinants of Health (SDOH) Interventions    Readmission Risk Interventions    11/17/2022   10:57 AM  Readmission Risk Prevention Plan  Transportation Screening Complete  PCP or Specialist Appt within 3-5 Days Complete  HRI or Home Care Consult Complete  Social Work Consult for Recovery Care Planning/Counseling Complete  Palliative Care Screening Not Applicable  Medication Review [employer]) Complete

## 2022-11-27 NOTE — Plan of Care (Signed)
  Problem: Coping: Goal: Ability to adjust to condition or change in health will improve Outcome: Progressing   Problem: Activity: Goal: Risk for activity intolerance will decrease Outcome: Progressing   Problem: Coping: Goal: Level of anxiety will decrease Outcome: Progressing   Problem: Safety: Goal: Ability to remain free from injury will improve Outcome: Progressing

## 2022-11-27 NOTE — Progress Notes (Signed)
PROGRESS NOTE    Kelly Rowland  YPP:[id number] DOB: 07/17/1941 DOA: 11/21/2022 PCP: Lake Bells, MD    Brief Narrative:  81 year old who recently had left carotid endarterectomy and was at a skilled nursing rehab started having cough and shortness of breath so sent to the emergency room.  She was diagnosed with COVID 19 about 2 days ago and was started on oxygen. Recently diagnosed with duodenal ulcer and on PPI. On presentation with her shortness of breath, CT angiogram was suggested but patient declined.  Lower extremity duplex with acute DVT.  Currently on heparin infusion.   Assessment & Plan:   Acute hypoxemic respiratory failure secondary to COVID-19 infection.  No evidence of pneumonia on radiograph.  Currently weaning of oxygen. COVID-19 infection:  Continue chest physiotherapy, incentive spirometry, deep breathing exercises, sputum induction, mucolytic's and bronchodilators. Supplemental oxygen to keep saturations more than 90%.  Wean off to room air as possible. Covid directed therapy with , steroids, completed 5 days of prednisone.  Not indicated further. remdesivir, completed 3 days of remdesivir. antibiotics, not indicated.  Suspected pulmonary embolism, DVT left popliteal vein: Declined CTA.  Duplex positive for acute DVT.  Was On heparin.  Changed to Eliquis for 3 months.  Monitor closely due to recent history of GI bleed.  Recent duodenal ulcer: No active bleeding.  Now on Eliquis.  Continue Protonix.  Change to oral on discharge.  Type 2 diabetes, well-controlled with hyperglycemia: Expect to worsen with steroid use.  Monitor and keep on insulin.  Essential hypertension low blood pressure stable on carvedilol, amlodipine and Lasix.  Peripheral vascular disease: Iliac stent 2003 Left carotid endarterectomy 11/17/2022 On aspirin.  Currently started on heparin and subsequent Eliquis.  Discontinue aspirin.  Continue statin.  CKD stage IIIb: At about her usual  levels.  Medically stable.  Transfer to SNF when bed available.   DVT prophylaxis:  apixaban (ELIQUIS) tablet 10 mg  apixaban (ELIQUIS) tablet 5 mg   Code Status: Full code Family Communication: None at the bedside.  She is talking to her son. Disposition Plan: Status is: Inpatient Remains inpatient appropriate because: Unsafe discharge disposition plan. Medically stabilizing to transfer to skilled level of care when available.     Consultants:  None  Procedures:  None  Antimicrobials:  Remdesivir, completed   Subjective: Seen and examined.  No overnight events.  Denies any chest pain shortness of breath.  Waiting to go to SNF.  Objective: Vitals:   11/27/22 0414 11/27/22 0815 11/27/22 0859 11/27/22 1217  BP: (!) 144/81 (!) 146/60  (!) 123/51  Pulse: 70 63  74  Resp: 19 18  20   Temp: 98.3 F (36.8 C) 98 F (36.7 C)  98.5 F (36.9 C)  TempSrc: Oral Oral  Oral  SpO2: 98% 97% 92% 96%  Weight: 60.8 kg     Height:        Intake/Output Summary (Last 24 hours) at 11/27/2022 1259 Last data filed at 11/27/2022 0846 Gross per 24 hour  Intake 243 ml  Output 1200 ml  Net -957 ml    Filed Weights   11/25/22 0333 11/26/22 0525 11/27/22 0414  Weight: 64.2 kg 63.4 kg 60.8 kg    Examination:  Looks comfortable.  No acute findings.  On 1 L oxygen.    Data Reviewed: I have personally reviewed following labs and imaging studies  CBC: Recent Labs  Lab 11/21/22 1903 11/22/22 0143 11/24/22 0455 11/25/22 0334 11/26/22 0347 11/27/22 0547  WBC 4.6 4.5 6.1 5.7  7.9 9.0  NEUTROABS 3.4  --   --   --   --   --   HGB 7.6* 8.3* 8.8* 8.8* 8.8* 8.9*  HCT 24.6* 27.1* 28.6* 28.7* 28.2* 28.9*  MCV 89.1 89.4 86.7 86.4 86.0 85.8  PLT 236 241 372 360 338 336    Basic Metabolic Panel: Recent Labs  Lab 11/21/22 1903 11/22/22 0143 11/24/22 0455 11/25/22 0334  NA 134* 136 136 138  K 3.4* 3.3* 2.9* 3.7  CL 98 99 96* 99  CO2 28 31 30  33*  GLUCOSE 227* 209* 207* 176*   BUN 13 9 19 17   CREATININE 0.84 0.67 0.90 0.84  CALCIUM 8.0* 8.1* 8.0* 8.0*    GFR: Estimated Creatinine Clearance: 46.6 mL/min (by C-G formula based on SCr of 0.84 mg/dL). Liver Function Tests: Recent Labs  Lab 11/21/22 1903 11/22/22 0143  AST 16 16  ALT 13 12  ALKPHOS 80 82  BILITOT 0.6 0.5  PROT 5.2* 5.5*  ALBUMIN 2.3* 2.3*    No results for input(s): "LIPASE", "AMYLASE" in the last 168 hours. No results for input(s): "AMMONIA" in the last 168 hours. Coagulation Profile: Recent Labs  Lab 11/22/22 1540  INR 1.1    Cardiac Enzymes: No results for input(s): "CKTOTAL", "CKMB", "CKMBINDEX", "TROPONINI" in the last 168 hours. BNP (last 3 results) No results for input(s): "PROBNP" in the last 8760 hours. HbA1C: No results for input(s): "HGBA1C" in the last 72 hours. CBG: Recent Labs  Lab 11/26/22 1233 11/26/22 1654 11/26/22 2042 11/27/22 0814 11/27/22 1214  GLUCAP 299* 279* 293* 176* 237*    Lipid Profile: No results for input(s): "CHOL", "HDL", "LDLCALC", "TRIG", "CHOLHDL", "LDLDIRECT" in the last 72 hours. Thyroid Function Tests: No results for input(s): "TSH", "T4TOTAL", "FREET4", "T3FREE", "THYROIDAB" in the last 72 hours. Anemia Panel: No results for input(s): "VITAMINB12", "FOLATE", "FERRITIN", "TIBC", "IRON", "RETICCTPCT" in the last 72 hours. Sepsis Labs: Recent Labs  Lab 11/22/22 0535  PROCALCITON <0.10     Recent Results (from the past 240 hour(s))  Resp panel by RT-PCR (RSV, Flu A&B, Covid) Anterior Nasal Swab     Status: Abnormal   Collection Time: 11/21/22  7:17 PM   Specimen: Anterior Nasal Swab  Result Value Ref Range Status   SARS Coronavirus 2 by RT PCR POSITIVE (A) NEGATIVE Final    Comment: (NOTE) SARS-CoV-2 target nucleic acids are DETECTED.  The SARS-CoV-2 RNA is generally detectable in upper respiratory specimens during the acute phase of infection. Positive results are indicative of the presence of the identified virus, but  do not rule out bacterial infection or co-infection with other pathogens not detected by the test. Clinical correlation with patient history and other diagnostic information is necessary to determine patient infection status. The expected result is Negative.  Fact Sheet for Patients: 14/10/23  Fact Sheet for Healthcare Providers: 14/09/23  This test is not yet approved or cleared by the [employer] FDA and  has been authorized for detection and/or diagnosis of SARS-CoV-2 by FDA under an Emergency Use Authorization (EUA).  This EUA will remain in effect (meaning this test can be used) for the duration of  the COVID-19 declaration under Section 564(b)(1) of the A ct, 21 U.S.C. section 360bbb-3(b)(1), unless the authorization is terminated or revoked sooner.     Influenza A by PCR NEGATIVE NEGATIVE Final   Influenza B by PCR NEGATIVE NEGATIVE Final    Comment: (NOTE) The Xpert Xpress SARS-CoV-2/FLU/RSV plus assay is intended as an aid in the  diagnosis of influenza from Nasopharyngeal swab specimens and should not be used as a sole basis for treatment. Nasal washings and aspirates are unacceptable for Xpert Xpress SARS-CoV-2/FLU/RSV testing.  Fact Sheet for Patients: BloggerCourse.com  Fact Sheet for Healthcare Providers: SeriousBroker.it  This test is not yet approved or cleared by the [place] FDA and has been authorized for detection and/or diagnosis of SARS-CoV-2 by FDA under an Emergency Use Authorization (EUA). This EUA will remain in effect (meaning this test can be used) for the duration of the COVID-19 declaration under Section 564(b)(1) of the Act, 21 U.S.C. section 360bbb-3(b)(1), unless the authorization is terminated or revoked.     Resp Syncytial Virus by PCR NEGATIVE NEGATIVE Final    Comment: (NOTE) Fact Sheet for  Patients: BloggerCourse.com  Fact Sheet for Healthcare Providers: SeriousBroker.it  This test is not yet approved or cleared by the [place] FDA and has been authorized for detection and/or diagnosis of SARS-CoV-2 by FDA under an Emergency Use Authorization (EUA). This EUA will remain in effect (meaning this test can be used) for the duration of the COVID-19 declaration under Section 564(b)(1) of the Act, 21 U.S.C. section 360bbb-3(b)(1), unless the authorization is terminated or revoked.  Performed at [facility], [address], [place], [place] [address]          Radiology Studies: No results found.      Scheduled Meds:  amLODipine  5 mg Oral Daily   apixaban  10 mg Oral BID   Followed by   Melene Muller ON 12/03/2022] apixaban  5 mg Oral BID   atorvastatin  40 mg Oral QHS   carvedilol  25 mg Oral BID WC   feeding supplement (GLUCERNA SHAKE)  237 mL Oral BID BM   furosemide  40 mg Oral Daily   insulin aspart  0-15 Units Subcutaneous TID WC   insulin glargine-yfgn  12 Units Subcutaneous Daily   mometasone-formoterol  2 puff Inhalation BID   multivitamin with minerals  1 tablet Oral Daily   pantoprazole (PROTONIX) IV  40 mg Intravenous Q12H   polyethylene glycol  17 g Oral Daily   Ensure Max Protein  11 oz Oral QHS   sodium chloride flush  3 mL Intravenous Q12H   Continuous Infusions:   LOS: 6 days    Time spent: 25 minutes    Dorcas Carrow, MD [facility] Pager [phone]

## 2022-11-28 DIAGNOSIS — J9601 Acute respiratory failure with hypoxia: Secondary | ICD-10-CM | POA: Diagnosis not present

## 2022-11-28 LAB — GLUCOSE, CAPILLARY
Glucose-Capillary: 154 mg/dL — ABNORMAL HIGH (ref 70–99)
Glucose-Capillary: 162 mg/dL — ABNORMAL HIGH (ref 70–99)
Glucose-Capillary: 184 mg/dL — ABNORMAL HIGH (ref 70–99)
Glucose-Capillary: 136 mg/dL — ABNORMAL HIGH (ref 70–99)

## 2022-11-28 NOTE — Progress Notes (Signed)
PROGRESS NOTE    Kelly Rowland  [id number] DOB: Jul 22, 1941 DOA: 11/21/2022 PCP: Volanda Napoleon, MD    Brief Narrative:  81 year old who recently had left carotid endarterectomy and was at a skilled nursing rehab started having cough and shortness of breath so sent to the emergency room.  She was diagnosed with COVID 19 about 2 days ago and was started on oxygen. Recently diagnosed with duodenal ulcer and on PPI. On presentation with her shortness of breath, CT angiogram was suggested but patient declined.  Lower extremity duplex with acute DVT.  Currently on heparin infusion.   Assessment & Plan:   Acute hypoxemic respiratory failure secondary to COVID-19 infection.  No evidence of pneumonia on radiograph.  Currently weaning of oxygen. COVID-19 infection:  Continue chest physiotherapy, incentive spirometry, deep breathing exercises, sputum induction, mucolytic's and bronchodilators. Supplemental oxygen to keep saturations more than 90%.  Wean off to room air as possible. Covid directed therapy with , steroids, completed 5 days of prednisone.  Not indicated further. remdesivir, completed 3 days of remdesivir. antibiotics, not indicated.  Suspected pulmonary embolism, DVT left popliteal vein: Declined CTA.  Duplex positive for acute DVT.  Was On heparin.  Changed to Eliquis for 3 months.  Monitor closely due to recent history of GI bleed.  Recent duodenal ulcer: No active bleeding.  Now on Eliquis.  Continue Protonix.  Change to oral on discharge.  Type 2 diabetes, well-controlled with hyperglycemia: Expect to worsen with steroid use.  Monitor and keep on insulin.  Essential hypertension low blood pressure stable on carvedilol, amlodipine and Lasix.  Peripheral vascular disease: Iliac stent 2003 Left carotid endarterectomy 11/17/2022 On aspirin.  Currently started on heparin and subsequent Eliquis.  Discontinue aspirin.  Continue statin.  CKD stage IIIb: At about her usual  levels.  Medically stable.  Transfer to SNF when bed available.   DVT prophylaxis:  apixaban (ELIQUIS) tablet 10 mg  apixaban (ELIQUIS) tablet 5 mg   Code Status: Full code Family Communication: None today. Disposition Plan: Status is: Inpatient Remains inpatient appropriate because: Unsafe discharge disposition plan. Medically stabilizing to transfer to skilled level of care when available.     Consultants:  None  Procedures:  None  Antimicrobials:  Remdesivir, completed   Subjective: No new events.  Objective: Vitals:   11/27/22 2346 11/28/22 0402 11/28/22 0444 11/28/22 0828  BP: (!) 157/59 (!) 156/54  (!) 148/53  Pulse: 62 60  65  Resp: 17 20  19   Temp: 98.1 F (36.7 C) 97.9 F (36.6 C)  97.9 F (36.6 C)  TempSrc: Oral Oral    SpO2: 93% 91%  93%  Weight:   56.3 kg   Height:        Intake/Output Summary (Last 24 hours) at 11/28/2022 1055 Last data filed at 11/28/2022 1000 Gross per 24 hour  Intake --  Output 800 ml  Net -800 ml    Filed Weights   11/26/22 0525 11/27/22 0414 11/28/22 0444  Weight: 63.4 kg 60.8 kg 56.3 kg    Examination:  Looks comfortable.  On 1 L oxygen.    Data Reviewed: I have personally reviewed following labs and imaging studies  CBC: Recent Labs  Lab 11/21/22 1903 11/22/22 0143 11/24/22 0455 11/25/22 0334 11/26/22 0347 11/27/22 0547  WBC 4.6 4.5 6.1 5.7 7.9 9.0  NEUTROABS 3.4  --   --   --   --   --   HGB 7.6* 8.3* 8.8* 8.8* 8.8* 8.9*  HCT 24.6* 27.1*  28.6* 28.7* 28.2* 28.9*  MCV 89.1 89.4 86.7 86.4 86.0 85.8  PLT 236 241 372 360 338 336    Basic Metabolic Panel: Recent Labs  Lab 11/21/22 1903 11/22/22 0143 11/24/22 0455 11/25/22 0334  NA 134* 136 136 138  K 3.4* 3.3* 2.9* 3.7  CL 98 99 96* 99  CO2 28 31 30  33*  GLUCOSE 227* 209* 207* 176*  BUN 13 9 19 17   CREATININE 0.84 0.67 0.90 0.84  CALCIUM 8.0* 8.1* 8.0* 8.0*    GFR: Estimated Creatinine Clearance: 43 mL/min (by C-G formula based on SCr  of 0.84 mg/dL). Liver Function Tests: Recent Labs  Lab 11/21/22 1903 11/22/22 0143  AST 16 16  ALT 13 12  ALKPHOS 80 82  BILITOT 0.6 0.5  PROT 5.2* 5.5*  ALBUMIN 2.3* 2.3*    No results for input(s): "LIPASE", "AMYLASE" in the last 168 hours. No results for input(s): "AMMONIA" in the last 168 hours. Coagulation Profile: Recent Labs  Lab 11/22/22 1540  INR 1.1    Cardiac Enzymes: No results for input(s): "CKTOTAL", "CKMB", "CKMBINDEX", "TROPONINI" in the last 168 hours. BNP (last 3 results) No results for input(s): "PROBNP" in the last 8760 hours. HbA1C: No results for input(s): "HGBA1C" in the last 72 hours. CBG: Recent Labs  Lab 11/27/22 0814 11/27/22 1214 11/27/22 1629 11/27/22 2208 11/28/22 0826  GLUCAP 176* 237* 125* 127* 154*    Lipid Profile: No results for input(s): "CHOL", "HDL", "LDLCALC", "TRIG", "CHOLHDL", "LDLDIRECT" in the last 72 hours. Thyroid Function Tests: No results for input(s): "TSH", "T4TOTAL", "FREET4", "T3FREE", "THYROIDAB" in the last 72 hours. Anemia Panel: No results for input(s): "VITAMINB12", "FOLATE", "FERRITIN", "TIBC", "IRON", "RETICCTPCT" in the last 72 hours. Sepsis Labs: Recent Labs  Lab 11/22/22 0535  PROCALCITON <0.10     Recent Results (from the past 240 hour(s))  Resp panel by RT-PCR (RSV, Flu A&B, Covid) Anterior Nasal Swab     Status: Abnormal   Collection Time: 11/21/22  7:17 PM   Specimen: Anterior Nasal Swab  Result Value Ref Range Status   SARS Coronavirus 2 by RT PCR POSITIVE (A) NEGATIVE Final    Comment: (NOTE) SARS-CoV-2 target nucleic acids are DETECTED.  The SARS-CoV-2 RNA is generally detectable in upper respiratory specimens during the acute phase of infection. Positive results are indicative of the presence of the identified virus, but do not rule out bacterial infection or co-infection with other pathogens not detected by the test. Clinical correlation with patient history and other diagnostic  information is necessary to determine patient infection status. The expected result is Negative.  Fact Sheet for Patients: 14/10/23  Fact Sheet for Healthcare Providers: 14/09/23  This test is not yet approved or cleared by the [employer] FDA and  has been authorized for detection and/or diagnosis of SARS-CoV-2 by FDA under an Emergency Use Authorization (EUA).  This EUA will remain in effect (meaning this test can be used) for the duration of  the COVID-19 declaration under Section 564(b)(1) of the A ct, 21 U.S.C. section 360bbb-3(b)(1), unless the authorization is terminated or revoked sooner.     Influenza A by PCR NEGATIVE NEGATIVE Final   Influenza B by PCR NEGATIVE NEGATIVE Final    Comment: (NOTE) The Xpert Xpress SARS-CoV-2/FLU/RSV plus assay is intended as an aid in the diagnosis of influenza from Nasopharyngeal swab specimens and should not be used as a sole basis for treatment. Nasal washings and aspirates are unacceptable for Xpert Xpress SARS-CoV-2/FLU/RSV testing.  Fact Sheet  for Patients: EntrepreneurPulse.com.au  Fact Sheet for Healthcare Providers: IncredibleEmployment.be  This test is not yet approved or cleared by the [place] FDA and has been authorized for detection and/or diagnosis of SARS-CoV-2 by FDA under an Emergency Use Authorization (EUA). This EUA will remain in effect (meaning this test can be used) for the duration of the COVID-19 declaration under Section 564(b)(1) of the Act, 21 U.S.C. section 360bbb-3(b)(1), unless the authorization is terminated or revoked.     Resp Syncytial Virus by PCR NEGATIVE NEGATIVE Final    Comment: (NOTE) Fact Sheet for Patients: EntrepreneurPulse.com.au  Fact Sheet for Healthcare Providers: IncredibleEmployment.be  This test is not yet approved or cleared by the  [place] FDA and has been authorized for detection and/or diagnosis of SARS-CoV-2 by FDA under an Emergency Use Authorization (EUA). This EUA will remain in effect (meaning this test can be used) for the duration of the COVID-19 declaration under Section 564(b)(1) of the Act, 21 U.S.C. section 360bbb-3(b)(1), unless the authorization is terminated or revoked.  Performed at [facility], [address], [place], [place] [address]          Radiology Studies: No results found.      Scheduled Meds:  amLODipine  5 mg Oral Daily   apixaban  10 mg Oral BID   Followed by   Derrill Memo ON 12/03/2022] apixaban  5 mg Oral BID   atorvastatin  40 mg Oral QHS   carvedilol  25 mg Oral BID WC   feeding supplement (GLUCERNA SHAKE)  237 mL Oral BID BM   furosemide  40 mg Oral Daily   insulin aspart  0-15 Units Subcutaneous TID WC   insulin glargine-yfgn  12 Units Subcutaneous Daily   mometasone-formoterol  2 puff Inhalation BID   multivitamin with minerals  1 tablet Oral Daily   pantoprazole  40 mg Oral BID   polyethylene glycol  17 g Oral Daily   Ensure Max Protein  11 oz Oral QHS   sodium chloride flush  3 mL Intravenous Q12H   Continuous Infusions:   LOS: 7 days    Time spent: 25 minutes    Barb Merino, MD [facility] Pager [phone]

## 2022-11-29 DIAGNOSIS — J9601 Acute respiratory failure with hypoxia: Secondary | ICD-10-CM | POA: Diagnosis not present

## 2022-11-29 LAB — GLUCOSE, CAPILLARY
Glucose-Capillary: 144 mg/dL — ABNORMAL HIGH (ref 70–99)
Glucose-Capillary: 120 mg/dL — ABNORMAL HIGH (ref 70–99)
Glucose-Capillary: 187 mg/dL — ABNORMAL HIGH (ref 70–99)
Glucose-Capillary: 152 mg/dL — ABNORMAL HIGH (ref 70–99)

## 2022-11-29 MED ORDER — GABAPENTIN 100 MG PO CAPS
100.0000 mg | ORAL_CAPSULE | Freq: Every day | ORAL | Status: DC
  Administered 2022-11-29 – 2022-11-30 (×2): 100 mg via ORAL
  Filled 2022-11-29 (×2): qty 1

## 2022-11-29 NOTE — Plan of Care (Signed)

## 2022-11-29 NOTE — Progress Notes (Signed)
PROGRESS NOTE    Kelly Rowland  GLO:[id number] DOB: 24-May-1941 DOA: 11/21/2022 PCP: Lake Bells, MD    Brief Narrative:  81 year old who recently had left carotid endarterectomy and was at a skilled nursing rehab started having cough and shortness of breath so sent to the emergency room.  She was diagnosed with COVID 19 about 2 days ago and was started on oxygen. Recently diagnosed with duodenal ulcer and on PPI. On presentation with her shortness of breath, CT angiogram was suggested but patient declined.  Lower extremity duplex with acute DVT.  Currently on heparin infusion.   Assessment & Plan:   Acute hypoxemic respiratory failure secondary to COVID-19 infection.  No evidence of pneumonia on radiograph.  Currently weaning of oxygen. COVID-19 infection:  Continue chest physiotherapy, incentive spirometry, deep breathing exercises, sputum induction, mucolytic's and bronchodilators. Supplemental oxygen to keep saturations more than 90%.  On room air today. Covid directed therapy with , steroids, completed 5 days of prednisone.  Not indicated further. remdesivir, completed 3 days of remdesivir. antibiotics, not indicated.  Suspected pulmonary embolism, DVT left popliteal vein: Declined CTA.  Duplex positive for acute DVT.  Was On heparin.  Changed to Eliquis for 3 months.  Monitor closely due to recent history of GI bleed.  Recent duodenal ulcer: No active bleeding.  Now on Eliquis.  Continue Protonix.  Change to oral on discharge.  Type 2 diabetes, well-controlled with hyperglycemia: Expect to worsen with steroid use.  Monitor and keep on insulin.  Essential hypertension low blood pressure stable on carvedilol, amlodipine and Lasix.  Peripheral vascular disease: Iliac stent 2003 Left carotid endarterectomy 11/17/2022 On aspirin.  Currently started on heparin and subsequent Eliquis.  Discontinue aspirin.  Continue statin.  CKD stage IIIb: At about her usual  levels.  Medically stable.  Transfer to SNF when bed available.   DVT prophylaxis:  apixaban (ELIQUIS) tablet 10 mg  apixaban (ELIQUIS) tablet 5 mg   Code Status: Full code Family Communication: None today. Disposition Plan: Status is: Inpatient Remains inpatient appropriate because: Unsafe discharge disposition plan. Medically stabilizing to transfer to skilled level of care when available.     Consultants:  None  Procedures:  None  Antimicrobials:  Remdesivir, completed   Subjective: Seen and examined.  No new events.  On room air.  Objective: Vitals:   11/28/22 2100 11/29/22 0000 11/29/22 0400 11/29/22 0849  BP: 134/76 135/74 (!) 142/68 (!) 154/63  Pulse: 70 72 70 75  Resp: 18 19 18 14   Temp: 98.6 F (37 C) 98 F (36.7 C) 98.4 F (36.9 C) 98.5 F (36.9 C)  TempSrc: Oral Oral Oral Oral  SpO2: 94% 93% 94% 91%  Weight:   51.1 kg   Height:       No intake or output data in the 24 hours ending 11/29/22 1029  Filed Weights   11/27/22 0414 11/28/22 0444 11/29/22 0400  Weight: 60.8 kg 56.3 kg 51.1 kg    Examination:  Looks comfortable.  Pleasant to conversation.  On room air.   Lungs clear bilateral.    Data Reviewed: I have personally reviewed following labs and imaging studies  CBC: Recent Labs  Lab 11/24/22 0455 11/25/22 0334 11/26/22 0347 11/27/22 0547  WBC 6.1 5.7 7.9 9.0  HGB 8.8* 8.8* 8.8* 8.9*  HCT 28.6* 28.7* 28.2* 28.9*  MCV 86.7 86.4 86.0 85.8  PLT 372 360 338 336   Basic Metabolic Panel: Recent Labs  Lab 11/24/22 0455 11/25/22 0334  NA 136 138  K 2.9* 3.7  CL 96* 99  CO2 30 33*  GLUCOSE 207* 176*  BUN 19 17  CREATININE 0.90 0.84  CALCIUM 8.0* 8.0*   GFR: Estimated Creatinine Clearance: 43 mL/min (by C-G formula based on SCr of 0.84 mg/dL). Liver Function Tests: No results for input(s): "AST", "ALT", "ALKPHOS", "BILITOT", "PROT", "ALBUMIN" in the last 168 hours.  No results for input(s): "LIPASE", "AMYLASE" in the  last 168 hours. No results for input(s): "AMMONIA" in the last 168 hours. Coagulation Profile: Recent Labs  Lab 11/22/22 1540  INR 1.1   Cardiac Enzymes: No results for input(s): "CKTOTAL", "CKMB", "CKMBINDEX", "TROPONINI" in the last 168 hours. BNP (last 3 results) No results for input(s): "PROBNP" in the last 8760 hours. HbA1C: No results for input(s): "HGBA1C" in the last 72 hours. CBG: Recent Labs  Lab 11/28/22 0826 11/28/22 1158 11/28/22 1728 11/28/22 2122 11/29/22 0835  GLUCAP 154* 162* 184* 136* 144*   Lipid Profile: No results for input(s): "CHOL", "HDL", "LDLCALC", "TRIG", "CHOLHDL", "LDLDIRECT" in the last 72 hours. Thyroid Function Tests: No results for input(s): "TSH", "T4TOTAL", "FREET4", "T3FREE", "THYROIDAB" in the last 72 hours. Anemia Panel: No results for input(s): "VITAMINB12", "FOLATE", "FERRITIN", "TIBC", "IRON", "RETICCTPCT" in the last 72 hours. Sepsis Labs: No results for input(s): "PROCALCITON", "LATICACIDVEN" in the last 168 hours.   Recent Results (from the past 240 hour(s))  Resp panel by RT-PCR (RSV, Flu A&B, Covid) Anterior Nasal Swab     Status: Abnormal   Collection Time: 11/21/22  7:17 PM   Specimen: Anterior Nasal Swab  Result Value Ref Range Status   SARS Coronavirus 2 by RT PCR POSITIVE (A) NEGATIVE Final    Comment: (NOTE) SARS-CoV-2 target nucleic acids are DETECTED.  The SARS-CoV-2 RNA is generally detectable in upper respiratory specimens during the acute phase of infection. Positive results are indicative of the presence of the identified virus, but do not rule out bacterial infection or co-infection with other pathogens not detected by the test. Clinical correlation with patient history and other diagnostic information is necessary to determine patient infection status. The expected result is Negative.  Fact Sheet for Patients: BloggerCourse.com  Fact Sheet for Healthcare  Providers: SeriousBroker.it  This test is not yet approved or cleared by the [place] FDA and  has been authorized for detection and/or diagnosis of SARS-CoV-2 by FDA under an Emergency Use Authorization (EUA).  This EUA will remain in effect (meaning this test can be used) for the duration of  the COVID-19 declaration under Section 564(b)(1) of the A ct, 21 U.S.C. section 360bbb-3(b)(1), unless the authorization is terminated or revoked sooner.     Influenza A by PCR NEGATIVE NEGATIVE Final   Influenza B by PCR NEGATIVE NEGATIVE Final    Comment: (NOTE) The Xpert Xpress SARS-CoV-2/FLU/RSV plus assay is intended as an aid in the diagnosis of influenza from Nasopharyngeal swab specimens and should not be used as a sole basis for treatment. Nasal washings and aspirates are unacceptable for Xpert Xpress SARS-CoV-2/FLU/RSV testing.  Fact Sheet for Patients: BloggerCourse.com  Fact Sheet for Healthcare Providers: SeriousBroker.it  This test is not yet approved or cleared by the [place] FDA and has been authorized for detection and/or diagnosis of SARS-CoV-2 by FDA under an Emergency Use Authorization (EUA). This EUA will remain in effect (meaning this test can be used) for the duration of the COVID-19 declaration under Section 564(b)(1) of the Act, 21 U.S.C. section 360bbb-3(b)(1), unless the authorization is terminated or revoked.  Resp Syncytial Virus by PCR NEGATIVE NEGATIVE Final    Comment: (NOTE) Fact Sheet for Patients: BloggerCourse.com  Fact Sheet for Healthcare Providers: SeriousBroker.it  This test is not yet approved or cleared by the [place] FDA and has been authorized for detection and/or diagnosis of SARS-CoV-2 by FDA under an Emergency Use Authorization (EUA). This EUA will remain in effect (meaning this test can be  used) for the duration of the COVID-19 declaration under Section 564(b)(1) of the Act, 21 U.S.C. section 360bbb-3(b)(1), unless the authorization is terminated or revoked.  Performed at [facility], [address] [place], [place] [address]          Radiology Studies: No results found.      Scheduled Meds:  amLODipine  5 mg Oral Daily   apixaban  10 mg Oral BID   Followed by   Melene Muller ON 12/03/2022] apixaban  5 mg Oral BID   atorvastatin  40 mg Oral QHS   carvedilol  25 mg Oral BID WC   feeding supplement (GLUCERNA SHAKE)  237 mL Oral BID BM   furosemide  40 mg Oral Daily   insulin aspart  0-15 Units Subcutaneous TID WC   insulin glargine-yfgn  12 Units Subcutaneous Daily   mometasone-formoterol  2 puff Inhalation BID   multivitamin with minerals  1 tablet Oral Daily   pantoprazole  40 mg Oral BID   polyethylene glycol  17 g Oral Daily   Ensure Max Protein  11 oz Oral QHS   sodium chloride flush  3 mL Intravenous Q12H   Continuous Infusions:   LOS: 8 days    Time spent: 25 minutes    Dorcas Carrow, MD [facility] Pager [phone]

## 2022-11-29 NOTE — Progress Notes (Addendum)
Physical Therapy Treatment Patient Details Name: Kelly Rowland MRN: [id number] DOB: 1941/11/06 Today's Date: 11/29/2022   History of Present Illness Pt is a 81 year old with past medical history significant for CVA, DM, CKD 3a, hypertension, GI bleed from duodenal ulcer with chronic blood loss anemia, [place] 11[place] who was admitted 11/21/2022 with shortness of breath.  MD assessment includes: COVID pneumonia, mild interstitial pulmonary edema small bilateral pleural effusion, moderate emphysema, DVT with pt started on heparin drip, acute hypoxic respiratory failure, and hypokalemia.    PT Comments    Pt in bed.  Reports she feels generally weaker over the past few days but continues to get up to [facility] with staff.  Supine AROM x 10 with frequent rest breaks due to fatigue.  She agrees to sitting EOB but refuses to get to recliner and stated she has not sat up for several days "I don't like it."  She is generally steady in sitting.  Stood with min a x 1 with close guarding.  She does endorse dizziness and does "sway" in standing.  Requests to sit and dizziness does clear quickly.  BP in sitting 131/69 P 75 Map 81.  Stood again but is unable to stand for complete reading due to dizziness.  BP 121/65 P 74 Map 80.  She may be well served with formal orthostatics next session.  Stated she does get dizzy going to [facility] but sits quickly enough that she feels safe doing so.  She remains sitting EOB for seated AROM.  Encouraged sitting in chair but she continued to decline.  Stressed importance of daily OOB with staff.  She returns to supine with min a x 1 and stated she would try to get up tomorrow.  Encouraged her to ask nursing to get her up when she is ready in the morning.  Encouraged 3x day HEP as she stated she has not been doing any ex on her own since admission.   Recommendations for follow up therapy are one component of a multi-disciplinary discharge planning process, led by  the attending physician.  Recommendations may be updated based on patient status, additional functional criteria and insurance authorization.  Follow Up Recommendations  Skilled nursing-short term rehab (<3 hours/day)     Assistance Recommended at Discharge Frequent or constant Supervision/Assistance  Patient can return home with the following A lot of help with walking and/or transfers;A lot of help with bathing/dressing/bathroom;Assistance with cooking/housework;Direct supervision/assist for medications management;Direct supervision/assist for financial management;Assist for transportation;Help with stairs or ramp for entrance   Equipment Recommendations  Rolling walker (2 wheels);BSC/3in1;Wheelchair (measurements PT);Wheelchair cushion (measurements PT);Hospital bed    Recommendations for Other Services       Precautions / Restrictions Precautions Precautions: Fall Restrictions Weight Bearing Restrictions: No Other Position/Activity Restrictions: Covid +     Mobility  Bed Mobility Overal bed mobility: Needs Assistance Bed Mobility: Supine to Sit, Sit to Supine     Supine to sit: Min assist Sit to supine: Min assist   General bed mobility comments: increased effort/time to perform as much as possible on own    Transfers Overall transfer level: Needs assistance Equipment used: Rolling walker (2 wheels) Transfers: Sit to/from Stand Sit to Stand: Min assist           General transfer comment: stood x 2 at EOB lmited by dizziness    Ambulation/Gait               General Gait Details: deferred due to dizziness  Stairs             Wheelchair Mobility    Modified Rankin (Stroke Patients Only)       Balance Overall balance assessment: Needs assistance Sitting-balance support: No upper extremity supported, Feet supported Sitting balance-Leahy Scale: Good     Standing balance support: Bilateral upper extremity supported, During functional  activity Standing balance-Leahy Scale: Poor Standing balance comment: hand on assist at all times                            Cognition Arousal/Alertness: Awake/alert Behavior During Therapy: WFL for tasks assessed/performed Overall Cognitive Status: Within Functional Limits for tasks assessed                                          Exercises Other Exercises Other Exercises: supine and seated AROM x 10    General Comments        Pertinent Vitals/Pain Pain Assessment Pain Assessment: Faces Faces Pain Scale: Hurts even more Pain Location: abdomen with leaning too far forward Pain Descriptors / Indicators: Aching, Sore, Discomfort Pain Intervention(s): Limited activity within patient's tolerance, Monitored during session, Repositioned    Home Living                          Prior Function            PT Goals (current goals can now be found in the care plan section) Progress towards PT goals: Progressing toward goals    Frequency    Min 2X/week      PT Plan Current plan remains appropriate    Co-evaluation              AM-PAC PT "6 Clicks" Mobility   Outcome Measure  Help needed turning from your back to your side while in a flat bed without using bedrails?: A Little Help needed moving from lying on your back to sitting on the side of a flat bed without using bedrails?: A Little Help needed moving to and from a bed to a chair (including a wheelchair)?: A Lot Help needed standing up from a chair using your arms (e.g., wheelchair or bedside chair)?: A Lot Help needed to walk in hospital room?: A Lot Help needed climbing 3-5 steps with a railing? : Total 6 Click Score: 13    End of Session Equipment Utilized During Treatment: Gait belt;Oxygen Activity Tolerance: Patient limited by fatigue Patient left: in bed;with call bell/phone within reach;with bed alarm set;Other (comment) Nurse Communication: Mobility  status;Precautions PT Visit Diagnosis: Muscle weakness (generalized) (M62.81);Difficulty in walking, not elsewhere classified (R26.2);History of falling (Z91.81);Unsteadiness on feet (R26.81)     Time: 1400-1431 PT Time Calculation (min) (ACUTE ONLY): 31 min  Charges:  $Therapeutic Exercise: 8-22 mins $Therapeutic Activity: 8-22 mins                     Danielle Dess, PTA 11/29/22, 3:00 PM

## 2022-11-30 DIAGNOSIS — J9601 Acute respiratory failure with hypoxia: Secondary | ICD-10-CM | POA: Diagnosis not present

## 2022-11-30 LAB — GLUCOSE, CAPILLARY
Glucose-Capillary: 166 mg/dL — ABNORMAL HIGH (ref 70–99)
Glucose-Capillary: 146 mg/dL — ABNORMAL HIGH (ref 70–99)
Glucose-Capillary: 128 mg/dL — ABNORMAL HIGH (ref 70–99)
Glucose-Capillary: 69 mg/dL — ABNORMAL LOW (ref 70–99)

## 2022-11-30 NOTE — TOC Progression Note (Signed)
Transition of Care [facility]) - Progression Note    Patient Details  Name: Kelly Rowland MRN: [id number] Date of Birth: 1941-04-07  [facility]) CM/SW Contact  Margarito Liner, LCSW Phone Number: 11/30/2022, 2:07 PM  Clinical Narrative:   [facility] confirmed they can accept patient tomorrow.  Expected Discharge Plan: Skilled Nursing Facility Barriers to Discharge: Continued Medical Work up  Expected Discharge Plan and Services Expected Discharge Plan: Skilled Nursing Facility                                               Social Determinants of Health (SDOH) Interventions    Readmission Risk Interventions    11/17/2022   10:57 AM  Readmission Risk Prevention Plan  Transportation Screening Complete  PCP or Specialist Appt within 3-5 Days Complete  HRI or Home Care Consult Complete  Social Work Consult for Recovery Care Planning/Counseling Complete  Palliative Care Screening Not Applicable  Medication Review [employer]) Complete

## 2022-11-30 NOTE — Assessment & Plan Note (Signed)
Patient with no baseline oxygen use came with hypoxia and shortness of breath secondary to COVID infection.  Required nonrebreather transiently and now on wean back to 3 L. Refused CTA as she did not want contrast. Lower extremity venous Doppler positive for right popliteal vein DVT, high risk for PE. -Started on Eliquis -Continue supplemental oxygen and wean as tolerated

## 2022-11-30 NOTE — Assessment & Plan Note (Signed)
Initial chest x-ray with findings concerning for interstitial edema and was given IV Lasix.  This was probably inflammatory change from acute COVID Recent echocardiogram November 2023 within normal limits no evidence of diastolic dysfunction or valvular dysfunction

## 2022-11-30 NOTE — Progress Notes (Signed)
Progress Note   Patient: Kelly Rowland HAL:[id number] DOB: 1941/08/27 DOA: 11/21/2022     9 DOS: the patient was seen and examined on 11/30/2022   Brief hospital course: Taken from prior notes.  Kelly Rowland is a 81 y.o. 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  Patient has been wearing 3 L oxygen by nasal cannula over the last 3 days due to the shortness of breath.  Patient states that she has never needed oxygen before her admission to the hospital approximately 1 week prior to arrival.  Of note, patient was diagnosed with COVID 2 days prior to arrival.  Per chart review patient has extensive history of acute blood loss anemia secondary to GI bleed from duodenal ulcers.  She also has left carotid endarterectomy recently on 11/17/2022 with vascular surgery and after the procedure she was discharged back to rehab.  Patient refused CTA for VTE evaluation, stating that she does not want any contrast as it can hurt her kidneys.  ED course.  On arrival to ED patient was hypoxic and transiently required nonrebreather.  Labs pertinent for hemoglobin of 7.6 which improved to 8.3 on repeat, no blood transfusion given.  Also found to have hyperglycemia and mild hypokalemia at 3.3 which was thought to be secondary to HCTZ use.  Procalcitonin negative.  COVID PCR positive CT chest without contrast with extensive atherosclerotic disease, mild interstitial pulmonary edema and small bilateral pleural effusion.  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 She was started on remdesivir and steroid.  12/10: Patient was weaned back to 3 L of oxygen.  Significant right lower extremity edema with no calf tenderness.  Venous Doppler studies were obtained and they shows DVT in right popliteal vein.  Will start her on heparin infusion, if remained stable with no more GI bleed then we can transition  to Eliquis.  Need to monitor hemoglobin very closely. Anticoagulating her might be a challenge.  Vascular surgery was also consulted to see if she is a candidate for IVC filter placement. Also giving 1 dose of IV Lasix     Assessment and Plan: * Acute hypoxic respiratory failure (HCC) Patient with no baseline oxygen use came with hypoxia and shortness of breath secondary to COVID infection.  Required nonrebreather transiently and now on wean back to 3 L. Refused CTA as she did not want contrast. Lower extremity venous Doppler positive for right popliteal vein DVT, high risk for PE. -Started on Eliquis -Continue supplemental oxygen and wean as tolerated  DVT of popliteal vein (HCC) Patient with significant right leg swelling and venous Doppler studies positive for DVT in popliteal vein.  Patient high risk for GI bleed due to her history of peptic ulcer disease, most recent a month ago. Refusing CTA as she does not want contrast, renal functions normal. -Starting her on heparin infusion -Vascular surgery consult to see if she is a candidate for IVC filter placement -Continue to monitor  COVID-19 virus infection Patient was tested +2 days ago at her facility, repeat COVID PCR remained positive.  Due to acute hypoxic respiratory failure she was started on remdesivir and prednisone.  She has subsequently completed remdesivir and prednisone -Continue supplemental oxygen -Patient continues to report excessive fatigue which is common after COVID infection -She will have completed her isolation and can return to her nursing facility on 12/19 -Continue Dulera and DuoNebs if possible at facility  Chronic kidney  disease, stage 3a (HCC) Renal function seems stable. -Monitor renal function  Chronic blood loss anemia Hemoglobin currently stable, with some improvement to 8.3 this morning. History of peptic ulcer disease, most recent admission earlier in November where she was treated for bleeding  duodenal ulcer.  No active bleeding at this time.  During that admission she received 3 unit of PRBC. Patient need anticoagulation for acute DVT and a possible PE. -Monitor hemoglobin closely -Reconsult GI if concern of any further bleeding   Duodenal ulcer with hemorrhage Pt is s/p egd showing duodenal ulcer. Pt's hematochezia and melena has resolved and  no longer has black stools.  -Monitor closely as patient is being started on heparin infusion  Restless legs Secondary to combination of iron deficiency anemia, degenerative disc disease and arthritis, PAD.  Peripheral arterial disease (HCC) Patient being closely followed by vascular team with Dr. Wyn Quaker. B/L Iliac stents in 2003, Left SES to SFA via left antegrade stick 08/2017 . Patient recently had left carotid endarterectomy on 11/17/2022 Continue patient on her statin therapy. Holding aspirin as we are giving her a trial with heparin infusion  Diabetes mellitus (HCC) CBG mildly elevated. -SSI  Coronary artery disease No chest pain. Cont statin therapy. Hold  antiplatelet therapy.   Essential hypertension Blood pressure elevated. -Continue Coreg -Giving 1 dose of IV Lasix -Can resume home diuretic if remains stable   Heart failure, unspecified (HCC) Initial chest x-ray with findings concerning for interstitial edema and was given IV Lasix.  This was probably inflammatory change from acute COVID Recent echocardiogram November 2023 within normal limits no evidence of diastolic dysfunction or valvular dysfunction   Intermittent claudication (HCC) -Being followed up by vascular surgery         Subjective: Primarily complaining of excessive fatigue and a desire to eat nonhospital food  Physical Exam: Vitals:   11/30/22 0502 11/30/22 0818 11/30/22 1239 11/30/22 1658  BP:  (!) 156/57 (!) 143/51 125/89  Pulse:  77 81 92  Resp:  20 18 20   Temp:  99.6 F (37.6 C) 98.9 F (37.2 C) 100.3 F (37.9 C)  TempSrc:  Oral   Oral  SpO2:  92% 95% 96%  Weight: 52.2 kg     Height:       Constitutional: NAD, calm, comfortable-she does appear mildly toxic and consistent with person in the recovery phase of COVID Respiratory: clear to auscultation bilaterally, no wheezing, no crackles. Normal respiratory effort. No accessory muscle use. 2 Liters oxygen Cardiovascular: Regular rate and rhythm, no murmurs / rubs / gallops. No extremity edema. 2+ pedal pulses.   Abdomen: no tenderness, no masses palpated. Bowel sounds positive.  Musculoskeletal: no clubbing / cyanosis. No joint deformity upper and lower extremities. Good ROM, no contractures. Normal muscle tone.  Skin: no rashes, lesions, ulcers. No induration Neurologic: CN 2-12 grossly intact. Sensation intact, DTR normal. Strength 5/5 x all 4 extremities.  Psychiatric: Normal judgment and insight. Alert and oriented x 3. Normal mood.   Data Reviewed:    Family Communication: Patient only  Disposition: Status is: Inpatient Remains inpatient appropriate because: Patient has complete her isolation.  Before returning to her facility  Planned Discharge Destination: Skilled nursing facility    Time spent: 35 minutes  Author: , NP 11/30/2022 5:57 PM  For on call review [email or web address].

## 2022-11-30 NOTE — Assessment & Plan Note (Signed)
Patient was tested +2 days ago at her facility, repeat COVID PCR remained positive.  Due to acute hypoxic respiratory failure she was started on remdesivir and prednisone.  She has subsequently completed remdesivir and prednisone -Continue supplemental oxygen -Patient continues to report excessive fatigue which is common after COVID infection -She will have completed her isolation and can return to her nursing facility on 12/19 -Continue Dulera and DuoNebs if possible at facility

## 2022-11-30 NOTE — Progress Notes (Signed)
Occupational Therapy Treatment Patient Details Name: Kelly Rowland MRN: [id number] DOB: Oct 15, 1941 Today's Date: 11/30/2022   History of present illness Pt is a 81 year old with past medical history significant for CVA, DM, CKD 3a, hypertension, GI bleed from duodenal ulcer with chronic blood loss anemia, recent [place] endarterectomy 11/17/2022 who was admitted 11/21/2022 with shortness of breath.  MD assessment includes: COVID pneumonia, mild interstitial pulmonary edema small bilateral pleural effusion, moderate emphysema, DVT with pt started on heparin drip, acute hypoxic respiratory failure, and hypokalemia.   OT comments  Upon entering the room, pt supine in bed and agreeable to OT intervention. Pt needing mod A for bed mobility and supervision for static sitting balance. Pt requesting to use bathroom and needing mod A to stand from bed and max A for stand pivot transfer to [facility]. Pt able to void and perform hygiene while seated on [facility] for safety. While seated, pt performing hand and face hygiene with set up A.  Pt needing assistance with balance and clothing management before returning to sit on EOB. Pt reporting 7/10 chronic back pain from "sitting up" and declining to sit up further. Sit >supine with mod A and bed adjusted with feature for pain management. Call bell and all needed items within reach. Pt continues to benefit from OT intervention and with continues recommendation for SNF prior to discharge home.    Recommendations for follow up therapy are one component of a multi-disciplinary discharge planning process, led by the attending physician.  Recommendations may be updated based on patient status, additional functional criteria and insurance authorization.    Follow Up Recommendations  Skilled nursing-short term rehab (<3 hours/day)     Assistance Recommended at Discharge Frequent or constant Supervision/Assistance  Patient can return home with the following  A lot of help with  bathing/dressing/bathroom;A lot of help with walking and/or transfers;Assistance with cooking/housework;Assist for transportation;Help with stairs or ramp for entrance;Assistance with feeding;Direct supervision/assist for medications management;Direct supervision/assist for financial management   Equipment Recommendations  Other (comment) (defer to the next venue of care)       Precautions / Restrictions Precautions Precautions: Fall       Mobility Bed Mobility Overal bed mobility: Needs Assistance Bed Mobility: Supine to Sit, Sit to Supine     Supine to sit: Min assist Sit to supine: Min assist   General bed mobility comments: increased effort/time to perform as much as possible on own    Transfers Overall transfer level: Needs assistance Equipment used: 1 person hand held assist Transfers: Sit to/from Stand, Bed to chair/wheelchair/BSC Sit to Stand: Mod assist     Step pivot transfers: Max assist           Balance Overall balance assessment: Needs assistance Sitting-balance support: No upper extremity supported, Feet supported Sitting balance-Leahy Scale: Good Sitting balance - Comments: steady sitting reaching within BOS   Standing balance support: Bilateral upper extremity supported, During functional activity Standing balance-Leahy Scale: Poor                             ADL either performed or assessed with clinical judgement   ADL Overall ADL's : Needs assistance/impaired                         Toilet Transfer: Moderate assistance   Toileting- Clothing Manipulation and Hygiene: Maximal assistance       Functional mobility during ADLs: Moderate assistance  Extremity/Trunk Assessment Upper Extremity Assessment Upper Extremity Assessment: Generalized weakness   Lower Extremity Assessment Lower Extremity Assessment: Generalized weakness        Vision Patient Visual Report: No change from baseline             Cognition Arousal/Alertness: Awake/alert Behavior During Therapy: WFL for tasks assessed/performed Overall Cognitive Status: Within Functional Limits for tasks assessed                                 General Comments: A&O x4                   Pertinent Vitals/ Pain       Pain Assessment Pain Assessment: 0-10 Pain Score: 7  Pain Location: back Pain Descriptors / Indicators: Aching, Discomfort Pain Intervention(s): Limited activity within patient's tolerance, Repositioned, Monitored during session  Home Living Family/patient expects to be discharged to:: Private residence                                            Frequency  Min 2X/week        Progress Toward Goals  OT Goals(current goals can now be found in the care plan section)  Progress towards OT goals: Progressing toward goals  Acute Rehab OT Goals Patient Stated Goal: to get stronger and go to rehab OT Goal Formulation: With patient Time For Goal Achievement: 12/10/22 Potential to Achieve Goals: Good  Plan Discharge plan remains appropriate;Frequency remains appropriate       AM-PAC OT "6 Clicks" Daily Activity     Outcome Measure   Help from another person eating meals?: None Help from another person taking care of personal grooming?: A Little Help from another person toileting, which includes using toliet, bedpan, or urinal?: A Lot Help from another person bathing (including washing, rinsing, drying)?: A Lot Help from another person to put on and taking off regular upper body clothing?: A Little Help from another person to put on and taking off regular lower body clothing?: A Lot 6 Click Score: 16    End of Session Equipment Utilized During Treatment: Oxygen  OT Visit Diagnosis: Unsteadiness on feet (R26.81);Repeated falls (R29.6);Muscle weakness (generalized) (M62.81);Pain;Other symptoms and signs involving the nervous system (R29.898)   Activity Tolerance  Patient limited by pain   Patient Left in bed;with call bell/phone within reach;with bed alarm set   Nurse Communication Mobility status;Patient requests pain meds        Time: 0626-9485 OT Time Calculation (min): 21 min  Charges: OT General Charges $OT Visit: 1 Visit OT Treatments $Self Care/Home Management : 8-22 mins  Jackquline Denmark, MS, OTR/L , CBIS ascom [phone]  11/30/22, 3:22 PM

## 2022-12-01 ENCOUNTER — Inpatient Hospital Stay: Payer: Medicare Other

## 2022-12-01 DIAGNOSIS — J9601 Acute respiratory failure with hypoxia: Secondary | ICD-10-CM | POA: Diagnosis not present

## 2022-12-01 LAB — CBC
HCT: 31.2 % — ABNORMAL LOW (ref 36.0–46.0)
Hemoglobin: 9.6 g/dL — ABNORMAL LOW (ref 12.0–15.0)
MCH: 26.4 pg (ref 26.0–34.0)
MCHC: 30.8 g/dL (ref 30.0–36.0)
MCV: 85.7 fL (ref 80.0–100.0)
Platelets: 279 10*3/uL (ref 150–400)
RBC: 3.64 MIL/uL — ABNORMAL LOW (ref 3.87–5.11)
RDW: 15.3 % (ref 11.5–15.5)
WBC: 18.5 10*3/uL — ABNORMAL HIGH (ref 4.0–10.5)
nRBC: 0 % (ref 0.0–0.2)

## 2022-12-01 LAB — BASIC METABOLIC PANEL
Anion gap: 12 (ref 5–15)
BUN: 17 mg/dL (ref 8–23)
CO2: 31 mmol/L (ref 22–32)
Calcium: 8.2 mg/dL — ABNORMAL LOW (ref 8.9–10.3)
Chloride: 90 mmol/L — ABNORMAL LOW (ref 98–111)
Creatinine, Ser: 0.97 mg/dL (ref 0.44–1.00)
GFR, Estimated: 59 mL/min — ABNORMAL LOW (ref >60)
Glucose, Bld: 170 mg/dL — ABNORMAL HIGH (ref 70–99)
Potassium: 3.7 mmol/L (ref 3.5–5.1)
Sodium: 133 mmol/L — ABNORMAL LOW (ref 135–145)

## 2022-12-01 LAB — MAGNESIUM: Magnesium: 2.1 mg/dL (ref 1.7–2.4)

## 2022-12-01 LAB — GLUCOSE, CAPILLARY
Glucose-Capillary: 122 mg/dL — ABNORMAL HIGH (ref 70–99)
Glucose-Capillary: 129 mg/dL — ABNORMAL HIGH (ref 70–99)
Glucose-Capillary: 129 mg/dL — ABNORMAL HIGH (ref 70–99)

## 2022-12-01 LAB — BRAIN NATRIURETIC PEPTIDE: B Natriuretic Peptide: 146.4 pg/mL — ABNORMAL HIGH (ref 0.0–100.0)

## 2022-12-01 MED ORDER — APIXABAN 5 MG PO TABS: ORAL_TABLET | ORAL | 0 refills | Status: AC

## 2022-12-01 MED ORDER — OXYCODONE-ACETAMINOPHEN 5-325 MG PO TABS: 1.0000 | ORAL_TABLET | Freq: Four times a day (QID) | ORAL | 0 refills | Status: AC | PRN

## 2022-12-01 MED ORDER — IPRATROPIUM-ALBUTEROL 20-100 MCG/ACT IN AERS
1.0000 | INHALATION_SPRAY | Freq: Four times a day (QID) | RESPIRATORY_TRACT | Status: DC
  Filled 2022-12-01: qty 4

## 2022-12-01 MED ORDER — GUAIFENESIN 100 MG/5ML PO LIQD: 5.0000 mL | ORAL | Status: DC | PRN

## 2022-12-01 MED ORDER — SENNOSIDES-DOCUSATE SODIUM 8.6-50 MG PO TABS: 1.0000 | ORAL_TABLET | Freq: Every evening | ORAL | Status: DC | PRN

## 2022-12-01 MED ORDER — TRAZODONE HCL 50 MG PO TABS: 50.0000 mg | ORAL_TABLET | Freq: Every evening | ORAL | Status: DC | PRN

## 2022-12-01 MED ORDER — FUROSEMIDE 10 MG/ML IJ SOLN
40.0000 mg | Freq: Every day | INTRAMUSCULAR | Status: DC
  Administered 2022-12-01: 40 mg via INTRAVENOUS
  Filled 2022-12-01: qty 4

## 2022-12-01 NOTE — Discharge Summary (Signed)
Physician Discharge Summary  Katherine MantleShirley Treese WGN:[id number] DOB: 17-Jan-1941 DOA: 11/21/2022  PCP: Lake BellsJacokes, Dennis N, MD  Admit date: 11/21/2022 Discharge date: 12/01/2022  Admitted From: SNF Disposition:  [facility]  Recommendations for Outpatient Follow-up:  Follow up with PCP in 1-2 weeks Please obtain BMP/CBC in one week your next doctors visit.  Eliquis 10mg  bid for 2 more days thereafter 5mg  po bid Daily bowel regimen Prn Oxycodone for pain.  Adjust Lasix depending on her Volume status.  4L [place], wean off as appropriate.    Discharge Condition: Stable CODE STATUS: Full  Diet recommendation: 2g Na  Brief/Interim Summary: 81 y.o. 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  Patient has been wearing 3 L oxygen by nasal cannula over the last 3 days due to the shortness of breath.  Patient states that she has never needed oxygen before her admission to the hospital approximately 1 week prior to arrival.  Of note, patient was diagnosed with COVID 2 days prior to arrival. Per chart review patient has extensive history of acute blood loss anemia secondary to GI bleed from duodenal ulcers. She also has left carotid endarterectomy recently on 11/17/2022 with vascular surgery and after the procedure she was discharged back to rehab. Patient refused CTA for VTE evaluation, stating that she does not want any contrast as it can hurt her kidneys.   Today her isolation ended and is stable for dc to SNF.    * Acute hypoxic respiratory failure (HCC);  Patient with no baseline oxygen use came with hypoxia and shortness of breath secondary to COVID infection.  She refuses CTA chest but now already on Eliquis.  Can get prn O2 if needed. Monitor her vol status.  Refused CTA as she did not want contrast. Lower extremity venous Doppler positive for right popliteal vein DVT,  high risk for PE.   DVT of popliteal vein (HCC) Patient with significant right leg swelling and venous Doppler studies positive for DVT in popliteal vein.  Patient high risk for GI bleed due to her history of peptic ulcer disease, most recent a month ago. Refusing CTA as she does not want contrast, renal functions normal. -Starting her on heparin infusion -Vascular surgery consult to see if she is a candidate for IVC filter placement -Continue to monitor   COVID-19 virus infection Completed remdesivir and prednisone -Continue supplemental oxygen -Patient continues to report excessive fatigue which is common after COVID infection -She will have completed her isolation and can return to her nursing facility on 12/19 -Continue Dulera and DuoNebs if possible at facility   Acute congestive heart failure with preserved ejection fraction, class III One time lasix today prior to her dc thereafter resume PO. Monitor her vol status at the facility. Adjust lasix as needed.  Recent echocardiogram November 2023 within normal limits no evidence of diastolic dysfunction or valvular dysfunction     Chronic kidney disease, stage 3a (HCC) Renal function seems stable. -Monitor renal function   Chronic blood loss anemia Hemoglobin currently stable History of peptic ulcer disease, most recent admission earlier in November where she was treated for bleeding duodenal ulcer.  No active bleeding at this time.  During that admission she received 3 unit of PRBC. For now due to acute DVT and concerns of PE, she will need AC.    Duodenal ulcer with hemorrhage Pt is s/p egd showing duodenal ulcer. Pt's  hematochezia and melena has resolved and  no longer has black stools.  -Monitor closely as patient is being started on heparin infusion   Restless legs Secondary to combination of iron deficiency anemia, degenerative disc disease and arthritis, PAD.   Peripheral arterial disease (HCC) Patient being closely  followed by vascular team with Dr. Wyn Quaker. B/L Iliac stents in 2003, Left SES to SFA via left antegrade stick 08/2017 . Patient recently had left carotid endarterectomy on 11/17/2022 Continue patient on her statin therapy. ASA    Diabetes mellitus (HCC) CBG mildly elevated. -SSI   Coronary artery disease No chest pain. Cont statin therapy. Hold  antiplatelet therapy.    Essential hypertension Blood pressure elevated. -Continue Coreg       Intermittent claudication (HCC) -Being followed up by vascular surgery         Discharge Diagnoses:  Principal Problem:   Acute hypoxic respiratory failure (HCC) Active Problems:   COVID-19 virus infection   DVT of popliteal vein (HCC)   Chronic blood loss anemia   Duodenal ulcer with hemorrhage   Peripheral arterial disease (HCC)   Heart failure, unspecified (HCC)   Essential hypertension   Diabetes mellitus (HCC)   Intermittent claudication (HCC)   Coronary artery disease   Chronic kidney disease, stage 3a (HCC)   Restless legs   Malnutrition of moderate degree      Consultations: Vascular.   Subjective: Sitting up in the chair, feels ok.   Discharge Exam: Vitals:   12/01/22 0616 12/01/22 0829  BP: (!) 139/54 (!) 142/52  Pulse: 71 73  Resp: 20 16  Temp: 99.1 F (37.3 C) 98.9 F (37.2 C)  SpO2: 91% 97%   Vitals:   12/01/22 0336 12/01/22 0341 12/01/22 0616 12/01/22 0829  BP: (!) 149/53  (!) 139/54 (!) 142/52  Pulse: 70  71 73  Resp: 20  20 16   Temp: 99.1 F (37.3 C)  99.1 F (37.3 C) 98.9 F (37.2 C)  TempSrc: Oral   Oral  SpO2: 92%  91% 97%  Weight:  58.6 kg    Height:        General: Pt is alert, awake, not in acute distress Cardiovascular: RRR, S1/S2 +, no rubs, no gallops Respiratory: minimal crackles at the bases.  Abdominal: Soft, NT, ND, bowel sounds + Extremities: no edema, no cyanosis  Discharge Instructions   Allergies as of 12/01/2022       Reactions   Amoxicillin Anaphylaxis    Lisinopril Anaphylaxis   Tramadol Nausea Only   Colchicine Nausea Only        Medication List     STOP taking these medications    nirmatrelvir/ritonavir EUA 20 x 150 MG & 10 x 100MG  Tabs Commonly known as: PAXLOVID       TAKE these medications    amLODipine 5 MG tablet Commonly known as: NORVASC Take 5 mg by mouth daily.   apixaban 5 MG Tabs tablet Commonly known as: ELIQUIS Take 2 tablets (10 mg total) by mouth 2 (two) times daily for 2 days, THEN 1 tablet (5 mg total) 2 (two) times daily for 28 days. Start taking on: December 03, 2022   aspirin EC 81 MG tablet Take 81 mg by mouth daily.   atorvastatin 40 MG tablet Commonly known as: LIPITOR Take 1 tablet (40 mg total) by mouth at bedtime.   carvedilol 25 MG tablet Commonly known as: COREG Take 25 mg by mouth 2 (two) times daily.   Cholecalciferol 50 MCG (2000  UT) Caps Take 1 capsule by mouth daily.   cyanocobalamin 500 MCG tablet Commonly known as: VITAMIN B12 Take 1 tablet by mouth daily.   DAILY VITE PO Take 1 tablet by mouth daily.   fluticasone-salmeterol 250-50 MCG/ACT Aepb Commonly known as: ADVAIR Inhale 1 puff into the lungs 2 (two) times daily.   folic acid 400 MCG tablet Commonly known as: FOLVITE Take 1 tablet by mouth daily.   furosemide 20 MG tablet Commonly known as: LASIX Take 1 tablet (20 mg total) by mouth daily. What changed: how much to take   gabapentin 100 MG capsule Commonly known as: NEURONTIN Take 100 mg by mouth at bedtime. Patients stats her orthopedic just prescribed this medication this past Thursday. She takes is every night at 0800PM   insulin glargine-yfgn 100 UNIT/ML injection Commonly known as: SEMGLEE Inject 0.12 mLs (12 Units total) into the skin daily. What changed: additional instructions   Klor-Con M20 20 MEQ tablet Generic drug: potassium chloride SA Take 20 mEq by mouth daily.   lidocaine 5 % Commonly known as: LIDODERM Place 1 patch onto the skin  daily. Remove & Discard patch within 12 hours or as directed by MD   NovoLOG FlexPen 100 UNIT/ML FlexPen Generic drug: insulin aspart Inject subcutaneously 3 times daily before meals and at bedtime per sliding scale   oxyCODONE-acetaminophen 5-325 MG tablet Commonly known as: PERCOCET/ROXICET Take 1-2 tablets by mouth every 6 (six) hours as needed. What changed:  how much to take reasons to take this   pantoprazole 40 MG tablet Commonly known as: PROTONIX Take 1 tablet (40 mg total) by mouth 2 (two) times daily.   senna 8.6 MG Tabs tablet Commonly known as: SENOKOT Take 1 tablet (8.6 mg total) by mouth 2 (two) times daily.   sucralfate 1 g tablet Commonly known as: CARAFATE Take 1 g by mouth 3 (three) times daily before meals.   Ventolin HFA 108 (90 Base) MCG/ACT inhaler Generic drug: albuterol Inhale 2 puffs into the lungs every 4 (four) hours as needed for shortness of breath, wheezing or cough.        Contact information for follow-up providers     Lake Bells, MD Follow up in 1 week(s).   Contact information: [address] [place] [address] [phone]              Contact information for after-discharge care     Destination     [facility] Preferred SNF .   Service: Skilled Nursing Contact information: [address] [place] [address] [phone]                    Allergies  Allergen Reactions   Amoxicillin Anaphylaxis   Lisinopril Anaphylaxis   Tramadol Nausea Only   Colchicine Nausea Only    You were cared for by a hospitalist during your hospital stay. If you have any questions about your discharge medications or the care you received while you were in the hospital after you are discharged, you can call the unit and asked to speak with the hospitalist on call if the hospitalist that took care of you is not available. Once you are discharged, your primary care physician will handle any  further medical issues. Please note that no refills for any discharge medications will be authorized once you are discharged, as it is imperative that you return to your primary care physician (or establish a relationship with a primary care physician if  you do not have one) for your aftercare needs so that they can reassess your need for medications and monitor your lab values.   Procedures/Studies: DG Chest Port 1 View  Result Date: 12/01/2022 CLINICAL DATA:  Dyspnea EXAM: PORTABLE CHEST 1 VIEW COMPARISON:  Radiograph 11/21/2022, CT 11/21/2022 FINDINGS: Unchanged cardiomediastinal silhouette. Decreased pleural effusions. Emphysema with unchanged mild interstitial opacities. No evidence of pneumothorax. Bones are unchanged. IMPRESSION: Decreased pleural effusions with persistent interstitial edema and bibasilar opacities compatible with atelectasis. Electronically Signed   By: Caprice Renshaw M.D.   On: 12/01/2022 08:54   US Venous Img Lower Bilateral (DVT)  Result Date: 11/22/2022 CLINICAL DATA:  Lower extremity edema EXAM: BILATERAL LOWER EXTREMITY VENOUS DOPPLER ULTRASOUND TECHNIQUE: Gray-scale sonography with compression, as well as color and duplex ultrasound, were performed to evaluate the deep venous system(s) from the level of the common femoral vein through the popliteal and proximal calf veins. COMPARISON:  None Available. FINDINGS: VENOUS Normal compressibility and Doppler waveforms of the common femoral, superficial femoral, and popliteal veins bilaterally. Occlusive thrombus noted in the right peroneal vein. The left peroneal vein was not well seen due to difficulties related to patient movement. OTHER None. Limitations: Patient movement IMPRESSION: 1. Positive for calf vein DVT in the right peroneal vein. Otherwise negative exam. Electronically Signed   By: Gaylyn Rong M.D.   On: 11/22/2022 13:20   CT CHEST WO CONTRAST  Result Date: 11/21/2022 CLINICAL DATA:  Pulmonary embolism (PE)  suspected, high prob. Dyspnea, cough EXAM: CT CHEST WITHOUT CONTRAST TECHNIQUE: Multidetector CT imaging of the chest was performed following the standard protocol without IV contrast. Intravenous contrast was refused by the patient. RADIATION DOSE REDUCTION: This exam was performed according to the departmental dose-optimization program which includes automated exposure control, adjustment of the mA and/or kV according to patient size and/or use of iterative reconstruction technique. COMPARISON:  None Available. FINDINGS: Cardiovascular: Extensive multi-vessel coronary artery calcification. Global cardiac size is within normal limits. Hypoattenuation of the cardiac blood pool is in keeping with at least mild anemia. No pericardial effusion. Central pulmonary arteries are enlarged in keeping with changes of pulmonary arterial hypertension. The examination is nondiagnostic for the detection of pulmonary embolism given lack of contrast administration. Extensive atherosclerotic calcification within the thoracic aorta and proximal arch vasculature. No aortic aneurysm. Particularly prominent atherosclerotic calcification within the proximal left common carotid artery and left subclavian artery origin may result in hemodynamically significant stenosis, however, this is not well assessed on this non arteriographic study. Mediastinum/Nodes: No enlarged mediastinal or axillary lymph nodes. Thyroid gland, trachea, and esophagus demonstrate no significant findings. Lungs/Pleura: Mild to moderate emphysema. Superimposed interlobular septal thickening and thickening of the peribronchovascular interstitium in keeping with mild interstitial pulmonary edema. Small bilateral pleural effusions are present with associated bibasilar compressive atelectasis. No pneumothorax. No central obstructing lesion. Upper Abdomen: No acute abnormality. Musculoskeletal: Degenerative changes are seen within the thoracic spine. No acute bone  abnormality. No lytic or blastic bone lesion. IMPRESSION: 1. Extensive multi-vessel coronary artery calcification. 2. Morphologic changes in keeping with pulmonary arterial hypertension. 3. Mild interstitial pulmonary edema and small bilateral pleural effusions in keeping with mild cardiogenic failure. 4. 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 5. Extensive atherosclerotic calcification within the proximal left common carotid artery and left subclavian artery origin. This may result in hemodynamically significant stenosis, however, this is not well assessed on this non arteriographic study. If there is clinical evidence of hemodynamically significant stenosis, this could be better assessed with CT arteriography. Aortic  Atherosclerosis (ICD10-I70.0) and Emphysema (ICD10-J43.9). Electronically Signed   By: Helyn Numbers M.D.   On: 11/21/2022 22:44   DG Chest Port 1 View  Result Date: 11/21/2022 CLINICAL DATA:  Shortness of breath COVID diagnosis 2 days ago. History of COPD. EXAM: PORTABLE CHEST 1 VIEW COMPARISON:  Radiograph 10/24/2022 FINDINGS: Stable cardiomegaly. Unchanged mediastinal contours with aortic tortuosity and atherosclerosis. Chronic bronchial thickening. Possible small bilateral pleural effusions. Patchy bibasilar opacities. No pneumothorax. IMPRESSION: 1. Patchy bibasilar opacities, may represent atelectasis or pneumonia in the setting of COVID-19. 2. Stable cardiomegaly.  Chronic bronchial thickening. Electronically Signed   By: Narda Rutherford M.D.   On: 11/21/2022 19:44   PERIPHERAL VASCULAR CATHETERIZATION  Result Date: 11/02/2022 See surgical note for result.    The results of significant diagnostics from this hospitalization (including imaging, microbiology, ancillary and laboratory) are listed below for reference.     Microbiology: Recent Results (from the past 240 hour(s))  Resp panel by RT-PCR (RSV, Flu A&B, Covid) Anterior Nasal Swab     Status: Abnormal   Collection Time: 11/21/22   7:17 PM   Specimen: Anterior Nasal Swab  Result Value Ref Range Status   SARS Coronavirus 2 by RT PCR POSITIVE (A) NEGATIVE Final    Comment: (NOTE) SARS-CoV-2 target nucleic acids are DETECTED.  The SARS-CoV-2 RNA is generally detectable in upper respiratory specimens during the acute phase of infection. Positive results are indicative of the presence of the identified virus, but do not rule out bacterial infection or co-infection with other pathogens not detected by the test. Clinical correlation with patient history and other diagnostic information is necessary to determine patient infection status. The expected result is Negative.  Fact Sheet for Patients: BloggerCourse.com  Fact Sheet for Healthcare Providers: SeriousBroker.it  This test is not yet approved or cleared by the [place] FDA and  has been authorized for detection and/or diagnosis of SARS-CoV-2 by FDA under an Emergency Use Authorization (EUA).  This EUA will remain in effect (meaning this test can be used) for the duration of  the COVID-19 declaration under Section 564(b)(1) of the A ct, 21 U.S.C. section 360bbb-3(b)(1), unless the authorization is terminated or revoked sooner.     Influenza A by PCR NEGATIVE NEGATIVE Final   Influenza B by PCR NEGATIVE NEGATIVE Final    Comment: (NOTE) The Xpert Xpress SARS-CoV-2/FLU/RSV plus assay is intended as an aid in the diagnosis of influenza from Nasopharyngeal swab specimens and should not be used as a sole basis for treatment. Nasal washings and aspirates are unacceptable for Xpert Xpress SARS-CoV-2/FLU/RSV testing.  Fact Sheet for Patients: BloggerCourse.com  Fact Sheet for Healthcare Providers: SeriousBroker.it  This test is not yet approved or cleared by the [place] FDA and has been authorized for detection and/or diagnosis of SARS-CoV-2 by FDA  under an Emergency Use Authorization (EUA). This EUA will remain in effect (meaning this test can be used) for the duration of the COVID-19 declaration under Section 564(b)(1) of the Act, 21 U.S.C. section 360bbb-3(b)(1), unless the authorization is terminated or revoked.     Resp Syncytial Virus by PCR NEGATIVE NEGATIVE Final    Comment: (NOTE) Fact Sheet for Patients: BloggerCourse.com  Fact Sheet for Healthcare Providers: SeriousBroker.it  This test is not yet approved or cleared by the [place] FDA and has been authorized for detection and/or diagnosis of SARS-CoV-2 by FDA under an Emergency Use Authorization (EUA). This EUA will remain in effect (meaning this test can be used) for the duration  of the COVID-19 declaration under Section 564(b)(1) of the Act, 21 U.S.C. section 360bbb-3(b)(1), unless the authorization is terminated or revoked.  Performed at [facility], [address] [place], [place] [address]      Labs: BNP (last 3 results) Recent Labs    10/19/22 0822 11/21/22 1903 12/01/22 0926  BNP 534.0* 445.8* 146.4*   Basic Metabolic Panel: Recent Labs  Lab 11/25/22 0334 12/01/22 0926  NA 138 133*  K 3.7 3.7  CL 99 90*  CO2 33* 31  GLUCOSE 176* 170*  BUN 17 17  CREATININE 0.84 0.97  CALCIUM 8.0* 8.2*  MG  --  2.1   Liver Function Tests: No results for input(s): "AST", "ALT", "ALKPHOS", "BILITOT", "PROT", "ALBUMIN" in the last 168 hours. No results for input(s): "LIPASE", "AMYLASE" in the last 168 hours. No results for input(s): "AMMONIA" in the last 168 hours. CBC: Recent Labs  Lab 11/25/22 0334 11/26/22 0347 11/27/22 0547 12/01/22 0926  WBC 5.7 7.9 9.0 18.5*  HGB 8.8* 8.8* 8.9* 9.6*  HCT 28.7* 28.2* 28.9* 31.2*  MCV 86.4 86.0 85.8 85.7  PLT 360 338 336 279   Cardiac Enzymes: No results for input(s): "CKTOTAL", "CKMB", "CKMBINDEX", "TROPONINI" in the last 168  hours. BNP: Invalid input(s): "POCBNP" CBG: Recent Labs  Lab 11/30/22 1234 11/30/22 1656 11/30/22 2029 12/01/22 0144 12/01/22 0829  GLUCAP 146* 128* 69* 122* 129*   D-Dimer No results for input(s): "DDIMER" in the last 72 hours. Hgb A1c No results for input(s): "HGBA1C" in the last 72 hours. Lipid Profile No results for input(s): "CHOL", "HDL", "LDLCALC", "TRIG", "CHOLHDL", "LDLDIRECT" in the last 72 hours. Thyroid function studies No results for input(s): "TSH", "T4TOTAL", "T3FREE", "THYROIDAB" in the last 72 hours.  Invalid input(s): "FREET3" Anemia work up No results for input(s): "VITAMINB12", "FOLATE", "FERRITIN", "TIBC", "IRON", "RETICCTPCT" in the last 72 hours. Urinalysis    Component Value Date/Time   COLORURINE YELLOW (A) 10/21/2022 1300   APPEARANCEUR HAZY (A) 10/21/2022 1300   LABSPEC 1.017 10/21/2022 1300   PHURINE 5.0 10/21/2022 1300   GLUCOSEU NEGATIVE 10/21/2022 1300   HGBUR NEGATIVE 10/21/2022 1300   BILIRUBINUR NEGATIVE 10/21/2022 1300   KETONESUR NEGATIVE 10/21/2022 1300   PROTEINUR NEGATIVE 10/21/2022 1300   NITRITE NEGATIVE 10/21/2022 1300   LEUKOCYTESUR MODERATE (A) 10/21/2022 1300   Sepsis Labs Recent Labs  Lab 11/25/22 0334 11/26/22 0347 11/27/22 0547 12/01/22 0926  WBC 5.7 7.9 9.0 18.5*   Microbiology Recent Results (from the past 240 hour(s))  Resp panel by RT-PCR (RSV, Flu A&B, Covid) Anterior Nasal Swab     Status: Abnormal   Collection Time: 11/21/22  7:17 PM   Specimen: Anterior Nasal Swab  Result Value Ref Range Status   SARS Coronavirus 2 by RT PCR POSITIVE (A) NEGATIVE Final    Comment: (NOTE) SARS-CoV-2 target nucleic acids are DETECTED.  The SARS-CoV-2 RNA is generally detectable in upper respiratory specimens during the acute phase of infection. Positive results are indicative of the presence of the identified virus, but do not rule out bacterial infection or co-infection with other pathogens not detected by the test.  Clinical correlation with patient history and other diagnostic information is necessary to determine patient infection status. The expected result is Negative.  Fact Sheet for Patients: BloggerCourse.com  Fact Sheet for Healthcare Providers: SeriousBroker.it  This test is not yet approved or cleared by the [place] FDA and  has been authorized for detection and/or diagnosis of SARS-CoV-2 by FDA under an Emergency Use Authorization (  EUA).  This EUA will remain in effect (meaning this test can be used) for the duration of  the COVID-19 declaration under Section 564(b)(1) of the A ct, 21 U.S.C. section 360bbb-3(b)(1), unless the authorization is terminated or revoked sooner.     Influenza A by PCR NEGATIVE NEGATIVE Final   Influenza B by PCR NEGATIVE NEGATIVE Final    Comment: (NOTE) The Xpert Xpress SARS-CoV-2/FLU/RSV plus assay is intended as an aid in the diagnosis of influenza from Nasopharyngeal swab specimens and should not be used as a sole basis for treatment. Nasal washings and aspirates are unacceptable for Xpert Xpress SARS-CoV-2/FLU/RSV testing.  Fact Sheet for Patients: BloggerCourse.com  Fact Sheet for Healthcare Providers: SeriousBroker.it  This test is not yet approved or cleared by the [place] FDA and has been authorized for detection and/or diagnosis of SARS-CoV-2 by FDA under an Emergency Use Authorization (EUA). This EUA will remain in effect (meaning this test can be used) for the duration of the COVID-19 declaration under Section 564(b)(1) of the Act, 21 U.S.C. section 360bbb-3(b)(1), unless the authorization is terminated or revoked.     Resp Syncytial Virus by PCR NEGATIVE NEGATIVE Final    Comment: (NOTE) Fact Sheet for Patients: BloggerCourse.com  Fact Sheet for Healthcare  Providers: SeriousBroker.it  This test is not yet approved or cleared by the [place] FDA and has been authorized for detection and/or diagnosis of SARS-CoV-2 by FDA under an Emergency Use Authorization (EUA). This EUA will remain in effect (meaning this test can be used) for the duration of the COVID-19 declaration under Section 564(b)(1) of the Act, 21 U.S.C. section 360bbb-3(b)(1), unless the authorization is terminated or revoked.  Performed at [facility], [address] [place], [place] [address]      Time coordinating discharge:  I have spent 35 minutes face to face with the patient and on the ward discussing the patients care, assessment, plan and disposition with other care givers. >50% of the time was devoted counseling the patient about the risks and benefits of treatment/Discharge disposition and coordinating care.   SIGNED:   Dimple Nanas, MD  [facility] 12/01/2022, 12:14 PM   If 7PM-7AM, please contact night-coverage

## 2022-12-01 NOTE — Inpatient Diabetes Management (Signed)
Inpatient Diabetes Program Recommendations  AACE/ADA: New Consensus Statement on Inpatient Glycemic Control   Target Ranges:  Prepandial:   less than 140 mg/dL      Peak postprandial:   less than 180 mg/dL (1-2 hours)      Critically ill patients:  140 - 180 mg/dL    Latest Reference Range & Units 11/30/22 08:24 11/30/22 12:34 11/30/22 16:56 11/30/22 20:29 12/01/22 01:44 12/01/22 08:29  Glucose-Capillary 70 - 99 mg/dL 350 (H) 093 (H) 818 (H) 69 (L) 122 (H) 129 (H)   Review of Glycemic Control  Diabetes history: DM2 Outpatient Diabetes medications: Semglee 12 units daily, Novolog TID Current orders for Inpatient glycemic control: Semglee 12 units daily, Novolog 0-15 units TID with meals  Inpatient Diabetes Program Recommendations:    Insulin: Please consider decreasing Novolog correction to 0-9 units TID with meals.  Thanks, Orlando Penner, RN, MSN, CDCES Diabetes Coordinator Inpatient Diabetes Program [phone] (Team Pager from 8am to 5pm)

## 2022-12-01 NOTE — TOC Progression Note (Signed)
Transition of Care [facility]) - Progression Note    Patient Details  Name: Kelly Rowland MRN: [id number] Date of Birth: 02-Aug-1941  Transition of Care [facility]) CM/SW Contact  Truddie Hidden, RN Phone Number: 12/01/2022, 11:45 AM  Clinical Narrative:    Per Tammy in admissions at [facility]. Patient can discharge today.    Expected Discharge Plan: Skilled Nursing Facility Barriers to Discharge: Continued Medical Work up  Expected Discharge Plan and Services Expected Discharge Plan: Skilled Nursing Facility                                               Social Determinants of Health (SDOH) Interventions    Readmission Risk Interventions    11/17/2022   10:57 AM  Readmission Risk Prevention Plan  Transportation Screening Complete  PCP or Specialist Appt within 3-5 Days Complete  HRI or Home Care Consult Complete  Social Work Consult for Recovery Care Planning/Counseling Complete  Palliative Care Screening Not Applicable  Medication Review Oceanographer) Complete

## 2022-12-01 NOTE — Care Management Important Message (Signed)
Important Message  Patient Details  Name: Kelly Rowland MRN: [id number] Date of Birth: 02-20-1941   Medicare Important Message Given:  Yes  Reviewed Medicare IM with patient via room phone [phone]) due to isolation status.  Copy of Medicare IM to be delivered to patient's room by nursing staff.    [name] 12/01/2022, 12:00 PM

## 2022-12-01 NOTE — Progress Notes (Signed)
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  MD assessment includes: COVID pneumonia, mild interstitial pulmonary edema small bilateral pleural effusion, moderate emphysema, DVT with pt started on heparin drip, acute hypoxic respiratory failure, and hypokalemia.    PT Comments    Pt in bed, ready for session.  She is able to get to EOB with supervision today and good effort.  Steady in sitting.  She is able to stand to walker with mod a x 1 and take several generally unsteady steps to recliner at bedside.  Remains high fall risk.  Stated chair felt good today.  She does transfer to/from [facility] with stand pivot transfer and mod a x 1.  Stated she does not like our walkers and prefers her rollator at home.  She does transfer back to recliner and remains up in chair with encouragement to remain up for lunch.  She voices understanding.  Does c/o B foot pain which she stated impacts ability to transfer.  No c/o dizziness today.  Overall seems more engaged and tolerates better but does exhibit some cognitive deficits "sometimes I think I do things but I really didn't"  Sats do drop to 87% with mobility but with cues for deep breathing return to high 90's.   Recommendations for follow up therapy are one component of a multi-disciplinary discharge planning process, led by the attending physician.  Recommendations may be updated based on patient status, additional functional criteria and insurance authorization.  Follow Up Recommendations  Skilled nursing-short term rehab (<3 hours/day)     Assistance Recommended at Discharge Frequent or constant  Supervision/Assistance  Patient can return home with the following A lot of help with walking and/or transfers;A lot of help with bathing/dressing/bathroom;Assistance with cooking/housework;Direct supervision/assist for medications management;Direct supervision/assist for financial management;Assist for transportation;Help with stairs or ramp for entrance   Equipment Recommendations       Recommendations for Other Services       Precautions / Restrictions Precautions Precautions: Fall Restrictions Weight Bearing Restrictions: No Other Position/Activity Restrictions: Covid +     Mobility  Bed Mobility Overal bed mobility: Needs Assistance Bed Mobility: Supine to Sit     Supine to sit: Supervision          Transfers Overall transfer level: Needs assistance Equipment used: Rolling walker (2 wheels), 1 person hand held assist Transfers: Sit to/from Stand, Bed to chair/wheelchair/BSC Sit to Stand: Mod assist Stand pivot transfers: Mod assist Step pivot transfers: Mod assist       General transfer comment: x1 with walker then pt opts to transfer to/from [facility] with no AD as she does not like or trust out walkers stating hers is better. Seems to be rollator.    Ambulation/Gait                   Stairs             Wheelchair Mobility    Modified Rankin (Stroke Patients Only)       Balance Overall balance assessment: Needs assistance Sitting-balance support: No upper extremity supported, Feet supported Sitting balance-Leahy Scale: Good     Standing balance support: Bilateral upper extremity supported, During functional activity Standing balance-Leahy Scale: Poor  Standing balance comment: hand on assist at all times - very unsteady                            Cognition Arousal/Alertness: Awake/alert Behavior During Therapy: WFL for tasks assessed/performed Overall Cognitive Status: Within Functional Limits for tasks assessed                                  General Comments: does seem to have some cognitive deficits but baseline unkown.        Exercises      General Comments        Pertinent Vitals/Pain Pain Assessment Pain Assessment: Faces Faces Pain Scale: Hurts little more Pain Location: B feet- "doctor says it's gout but I don't think so" Pain Descriptors / Indicators: Aching, Discomfort, Sore Pain Intervention(s): Limited activity within patient's tolerance, Repositioned, Monitored during session    Home Living                          Prior Function            PT Goals (current goals can now be found in the care plan section) Progress towards PT goals: Progressing toward goals    Frequency    Min 2X/week      PT Plan Current plan remains appropriate    Co-evaluation              AM-PAC PT "6 Clicks" Mobility   Outcome Measure  Help needed turning from your back to your side while in a flat bed without using bedrails?: A Little Help needed moving from lying on your back to sitting on the side of a flat bed without using bedrails?: A Little Help needed moving to and from a bed to a chair (including a wheelchair)?: A Lot Help needed standing up from a chair using your arms (e.g., wheelchair or bedside chair)?: A Lot Help needed to walk in hospital room?: Total Help needed climbing 3-5 steps with a railing? : Total 6 Click Score: 12    End of Session Equipment Utilized During Treatment: Gait belt;Oxygen Activity Tolerance: Patient limited by fatigue Patient left: in chair;with call bell/phone within reach;with chair alarm set Nurse Communication: Mobility status;Precautions PT Visit Diagnosis: Muscle weakness (generalized) (M62.81);Difficulty in walking, not elsewhere classified (R26.2);History of falling (Z91.81);Unsteadiness on feet (R26.81)     Time: 6606-3016 PT Time Calculation (min) (ACUTE ONLY): 23 min  Charges:  $Therapeutic Activity: 23-37  mins                   Danielle Dess, PTA 12/01/22, 10:51 AM

## 2022-12-01 NOTE — TOC Transition Note (Signed)
Transition of Care [facility]) - CM/SW Discharge Note   Patient Details  Name: Tameisha Covell MRN: [id number] Date of Birth: [birth year]  Transition of Care [facility]) CM/SW Contact:  Truddie Hidden, RN Phone Number: 12/01/2022, 1:13 PM   Clinical Narrative:    Spoke with Tammy at [facility]. Patient will be accepted today pending medical clearance. MD notified. Discharge order received.   Discharge summary and transfer summary sent in HUB.  Gena in admissions notified. Per Gena  patient assigned room [id number] Nurse will call report to [phone] Face sheet an medical necessity form printed to the floor to be added by nurse to the EMS packet.  EMS scheduled for 2pm Patient notified.  Called and left a message for patient's Ulice Dash regarding discharge plan for today. Requested return call to this RNCM  TOC signing off.     Final next level of care: Skilled Nursing Facility Barriers to Discharge: Barriers Resolved   Patient Goals and CMS Choice Patient states their goals for this hospitalization and ongoing recovery are:: To return to [facility] CMS Medicare.gov Compare Post Acute Care list provided to:: Patient Choice offered to / list presented to : Patient  Discharge Placement                Patient to be transferred to facility by: [facility] Name of family member notified: Riley Lam, [phone] Patient and family notified of of transfer: 12/01/22  Discharge Plan and Services                                     Social Determinants of Health (SDOH) Interventions     Readmission Risk Interventions    11/17/2022   10:57 AM  Readmission Risk Prevention Plan  Transportation Screening Complete  PCP or Specialist Appt within 3-5 Days Complete  HRI or Home Care Consult Complete  Social Work Consult for Recovery Care Planning/Counseling Complete  Palliative Care Screening Not Applicable  Medication Review Oceanographer) Complete

## 2022-12-02 ENCOUNTER — Other Ambulatory Visit: Payer: Self-pay

## 2022-12-02 ENCOUNTER — Ambulatory Visit: Payer: Medicare Other | Admitting: Gastroenterology

## 2022-12-21 ENCOUNTER — Other Ambulatory Visit (INDEPENDENT_AMBULATORY_CARE_PROVIDER_SITE_OTHER): Payer: Self-pay | Admitting: Vascular Surgery

## 2022-12-21 DIAGNOSIS — I6521 Occlusion and stenosis of right carotid artery: Secondary | ICD-10-CM

## 2022-12-22 ENCOUNTER — Encounter (INDEPENDENT_AMBULATORY_CARE_PROVIDER_SITE_OTHER): Payer: Medicare Other

## 2022-12-22 ENCOUNTER — Ambulatory Visit (INDEPENDENT_AMBULATORY_CARE_PROVIDER_SITE_OTHER): Payer: Medicare Other | Admitting: Nurse Practitioner

## 2023-02-04 ENCOUNTER — Emergency Department
Admission: EM | Admit: 2023-02-04 | Discharge: 2023-02-04 | Disposition: A | Discharge status: Home / Self Care | Payer: Medicare Other | Attending: Student in an Organized Health Care Education/Training Program | Admitting: Student in an Organized Health Care Education/Training Program

## 2023-02-04 ENCOUNTER — Emergency Department: Payer: Medicare Other

## 2023-02-04 DIAGNOSIS — R0602 Shortness of breath: Secondary | ICD-10-CM | POA: Diagnosis not present

## 2023-02-04 DIAGNOSIS — D649 Anemia, unspecified: Secondary | ICD-10-CM | POA: Insufficient documentation

## 2023-02-04 LAB — CBC WITH DIFFERENTIAL/PLATELET
Abs Immature Granulocytes: 0.02 10*3/uL (ref 0.00–0.07)
Basophils Absolute: 0 10*3/uL (ref 0.0–0.1)
Basophils Relative: 1 %
Eosinophils Absolute: 0.2 10*3/uL (ref 0.0–0.5)
Eosinophils Relative: 3 %
HCT: 26.7 % — ABNORMAL LOW (ref 36.0–46.0)
Hemoglobin: 8 g/dL — ABNORMAL LOW (ref 12.0–15.0)
Immature Granulocytes: 0 %
Lymphocytes Relative: 14 %
Lymphs Abs: 1 10*3/uL (ref 0.7–4.0)
MCH: 25.3 pg — ABNORMAL LOW (ref 26.0–34.0)
MCHC: 30 g/dL (ref 30.0–36.0)
MCV: 84.5 fL (ref 80.0–100.0)
Monocytes Absolute: 0.7 10*3/uL (ref 0.1–1.0)
Monocytes Relative: 10 %
Neutro Abs: 5.2 10*3/uL (ref 1.7–7.7)
Neutrophils Relative %: 72 %
Platelets: 286 10*3/uL (ref 150–400)
RBC: 3.16 MIL/uL — ABNORMAL LOW (ref 3.87–5.11)
RDW: 16.5 % — ABNORMAL HIGH (ref 11.5–15.5)
WBC: 7.2 10*3/uL (ref 4.0–10.5)
nRBC: 0 % (ref 0.0–0.2)

## 2023-02-04 LAB — COMPREHENSIVE METABOLIC PANEL
ALT: 12 U/L (ref 0–44)
AST: 17 U/L (ref 15–41)
Albumin: 3.5 g/dL (ref 3.5–5.0)
Alkaline Phosphatase: 69 U/L (ref 38–126)
Anion gap: 12 (ref 5–15)
BUN: 45 mg/dL — ABNORMAL HIGH (ref 8–23)
CO2: 28 mmol/L (ref 22–32)
Calcium: 8.9 mg/dL (ref 8.9–10.3)
Chloride: 96 mmol/L — ABNORMAL LOW (ref 98–111)
Creatinine, Ser: 1.75 mg/dL — ABNORMAL HIGH (ref 0.44–1.00)
GFR, Estimated: 29 mL/min — ABNORMAL LOW (ref >60)
Glucose, Bld: 209 mg/dL — ABNORMAL HIGH (ref 70–99)
Potassium: 3.6 mmol/L (ref 3.5–5.1)
Sodium: 136 mmol/L (ref 135–145)
Total Bilirubin: 0.7 mg/dL (ref 0.3–1.2)
Total Protein: 6.8 g/dL (ref 6.5–8.1)

## 2023-02-04 LAB — PROTIME-INR
INR: 1.8 — ABNORMAL HIGH (ref 0.8–1.2)
Prothrombin Time: 20.3 seconds — ABNORMAL HIGH (ref 11.4–15.2)

## 2023-02-04 LAB — TROPONIN I (HIGH SENSITIVITY): Troponin I (High Sensitivity): 8 ng/L (ref <18)

## 2023-02-04 MED ORDER — SODIUM CHLORIDE 0.9 % IV BOLUS: 500.0000 mL | Freq: Once | INTRAVENOUS | Status: DC

## 2023-02-04 NOTE — ED Triage Notes (Signed)
Patient arrives via EMS with c/o SOB and abnormal labs. Patient states Dr. Lorayne Bender called and told her that her hbg is very low and she needs to be seen to receive blood. Patient states that she was d/c 01/04/23 for covid/blood clots. Patient wear 2L [place] at home.

## 2023-02-04 NOTE — ED Provider Notes (Signed)
[facility] Provider Note    Event Date/Time   First MD Initiated Contact with Patient 02/04/23 1458     (approximate)   History   Abnormal Lab and Shortness of Breath   HPI  Kelly Rowland is a 82 y.o. female history of chronic anemia as well as chronic dyspnea on chronic home oxygen presents to the ER because reportedly her hemoglobin was below the reference range at outpatient clinic on Tuesday.  She denies any new shortness of breath.  Does not feel any new weakness.  She feels like she is at her baseline.  Denies any hematochezia no hematemesis no melena.  No nosebleeds.  Denies any chest pain or pressure.     Physical Exam   Triage Vital Signs: ED Triage Vitals  Enc Vitals Group     BP 02/04/23 1501 (!) 134/50     Pulse --      Resp --      Temp 02/04/23 1501 99.1 F (37.3 C)     Temp src --      SpO2 02/04/23 1501 94 %     Weight --      Height --      Head Circumference --      Peak Flow --      Pain Score 02/04/23 1505 0     Pain Loc --      Pain Edu? --      Excl. in [place]? --     Most recent vital signs: Vitals:   02/04/23 1501  BP: (!) 134/50  Temp: 99.1 F (37.3 C)  SpO2: 94%     Constitutional: Alert  Eyes: Conjunctivae are normal.  Head: Atraumatic. Nose: No congestion/rhinnorhea. Mouth/Throat: Mucous membranes are moist.   Neck: Painless ROM.  Cardiovascular:   Good peripheral circulation. Respiratory: Normal respiratory effort.  No retractions.  Gastrointestinal: Soft and nontender.  Musculoskeletal:  no deformity Neurologic:  MAE spontaneously. No gross focal neurologic deficits are appreciated.  Skin:  Skin is warm, dry and intact. No rash noted. Psychiatric: Mood and affect are normal. Speech and behavior are normal.    ED Results / Procedures / Treatments   Labs (all labs ordered are listed, but only abnormal results are displayed) Labs Reviewed  CBC WITH DIFFERENTIAL/PLATELET - Abnormal; Notable for  the following components:      Result Value   RBC 3.16 (*)    Hemoglobin 8.0 (*)    HCT 26.7 (*)    MCH 25.3 (*)    RDW 16.5 (*)    All other components within normal limits  COMPREHENSIVE METABOLIC PANEL - Abnormal; Notable for the following components:   Chloride 96 (*)    Glucose, Bld 209 (*)    BUN 45 (*)    Creatinine, Ser 1.75 (*)    GFR, Estimated 29 (*)    All other components within normal limits  PROTIME-INR - Abnormal; Notable for the following components:   Prothrombin Time 20.3 (*)    INR 1.8 (*)    All other components within normal limits  TYPE AND SCREEN  TROPONIN I (HIGH SENSITIVITY)  TROPONIN I (HIGH SENSITIVITY)     EKG  ED ECG REPORT I, Merlyn Lot, the attending physician, personally viewed and interpreted this ECG.   Date: 02/04/2023  EKG Time: 15:06  Rate:70  Rhythm: sinus  Axis: normal  Intervals: normal  ST&T Change: no stemi, no depressions    RADIOLOGY Please see ED Course for my  review and interpretation.  I personally reviewed all radiographic images ordered to evaluate for the above acute complaints and reviewed radiology reports and findings.  These findings were personally discussed with the patient.  Please see medical record for radiology report.    PROCEDURES:  Critical Care performed: No  Procedures   MEDICATIONS ORDERED IN ED: Medications  sodium chloride 0.9 % bolus 500 mL (500 mLs Intravenous Not Given 02/04/23 1550)     IMPRESSION / MDM / ASSESSMENT AND PLAN / ED COURSE  I reviewed the triage vital signs and the nursing notes.                              Differential diagnosis includes, but is not limited to, chronic anemia, acute blood loss anemia, GIB, ACS, COPD, CHF.   Patient presenting to the ER for evaluation of symptoms as described above.  Based on symptoms, risk factors and considered above differential, this presenting complaint could reflect a potentially life-threatening illness therefore the  patient will be placed on continuous pulse oximetry and telemetry for monitoring.  Laboratory evaluation will be sent to evaluate for the above complaints.  Her blood work is at her baseline.  She is not having any signs or symptoms of acute blood loss anemia or symptomatic anemia at this time.  Her vital signs are stable.  Troponin negative.  Chest x-ray on my review and interpretation without evidence of consolidation or effusion.  Patient is otherwise completely asymptomatic and at her baseline.  No indication for further diagnostic testing or workup.  Does appear stable and appropriate for outpatient follow-up.   Clinical Course as of 02/04/23 1657  Thu Feb 04, 2023  1542 Hemoglobin(!): 8.0 [HH]    Clinical Course User Index [HH] Prince Rome, Student-PA    FINAL CLINICAL IMPRESSION(S) / ED DIAGNOSES   Final diagnoses:  Chronic anemia     Rx / DC Orders   ED Discharge Orders     None        Note:  This document was prepared using Dragon voice recognition software and may include unintentional dictation errors.    Merlyn Lot, MD 02/04/23 [phone]

## 2023-02-04 NOTE — ED Notes (Signed)
Patient reports being so weak and tired she fell asleep yesterday eating pancakes and again this morning while brushing her teeth.

## 2023-03-15 ENCOUNTER — Emergency Department: Payer: Medicare Other

## 2023-03-15 ENCOUNTER — Other Ambulatory Visit: Payer: Self-pay

## 2023-03-15 ENCOUNTER — Inpatient Hospital Stay
Admission: EM | Admit: 2023-03-15 | Discharge: 2023-03-19 | DRG: 291 | Disposition: A | Discharge status: Home Health | Payer: Medicare Other | Attending: Internal Medicine | Admitting: Internal Medicine

## 2023-03-15 DIAGNOSIS — I5033 Acute on chronic diastolic (congestive) heart failure: Secondary | ICD-10-CM | POA: Insufficient documentation

## 2023-03-15 DIAGNOSIS — E876 Hypokalemia: Secondary | ICD-10-CM | POA: Diagnosis present

## 2023-03-15 DIAGNOSIS — Z87891 Personal history of nicotine dependence: Secondary | ICD-10-CM

## 2023-03-15 DIAGNOSIS — Z7982 Long term (current) use of aspirin: Secondary | ICD-10-CM | POA: Diagnosis not present

## 2023-03-15 DIAGNOSIS — G2581 Restless legs syndrome: Secondary | ICD-10-CM | POA: Diagnosis present

## 2023-03-15 DIAGNOSIS — J9621 Acute and chronic respiratory failure with hypoxia: Secondary | ICD-10-CM

## 2023-03-15 DIAGNOSIS — Z7901 Long term (current) use of anticoagulants: Secondary | ICD-10-CM

## 2023-03-15 DIAGNOSIS — J441 Chronic obstructive pulmonary disease with (acute) exacerbation: Secondary | ICD-10-CM | POA: Diagnosis present

## 2023-03-15 DIAGNOSIS — Z1152 Encounter for screening for COVID-19: Secondary | ICD-10-CM

## 2023-03-15 DIAGNOSIS — Z885 Allergy status to narcotic agent status: Secondary | ICD-10-CM

## 2023-03-15 DIAGNOSIS — M109 Gout, unspecified: Secondary | ICD-10-CM | POA: Diagnosis present

## 2023-03-15 DIAGNOSIS — E1165 Type 2 diabetes mellitus with hyperglycemia: Secondary | ICD-10-CM | POA: Diagnosis present

## 2023-03-15 DIAGNOSIS — E1122 Type 2 diabetes mellitus with diabetic chronic kidney disease: Secondary | ICD-10-CM | POA: Diagnosis present

## 2023-03-15 DIAGNOSIS — Z7985 Long-term (current) use of injectable non-insulin antidiabetic drugs: Secondary | ICD-10-CM

## 2023-03-15 DIAGNOSIS — J449 Chronic obstructive pulmonary disease, unspecified: Secondary | ICD-10-CM

## 2023-03-15 DIAGNOSIS — Z888 Allergy status to other drugs, medicaments and biological substances status: Secondary | ICD-10-CM

## 2023-03-15 DIAGNOSIS — Z7951 Long term (current) use of inhaled steroids: Secondary | ICD-10-CM

## 2023-03-15 DIAGNOSIS — J9601 Acute respiratory failure with hypoxia: Secondary | ICD-10-CM | POA: Diagnosis present

## 2023-03-15 DIAGNOSIS — Z86718 Personal history of other venous thrombosis and embolism: Secondary | ICD-10-CM

## 2023-03-15 DIAGNOSIS — Z7984 Long term (current) use of oral hypoglycemic drugs: Secondary | ICD-10-CM | POA: Diagnosis not present

## 2023-03-15 DIAGNOSIS — I2510 Atherosclerotic heart disease of native coronary artery without angina pectoris: Secondary | ICD-10-CM | POA: Diagnosis present

## 2023-03-15 DIAGNOSIS — Z79899 Other long term (current) drug therapy: Secondary | ICD-10-CM | POA: Diagnosis not present

## 2023-03-15 DIAGNOSIS — Z8249 Family history of ischemic heart disease and other diseases of the circulatory system: Secondary | ICD-10-CM

## 2023-03-15 DIAGNOSIS — Z794 Long term (current) use of insulin: Secondary | ICD-10-CM | POA: Diagnosis not present

## 2023-03-15 DIAGNOSIS — Z833 Family history of diabetes mellitus: Secondary | ICD-10-CM | POA: Diagnosis not present

## 2023-03-15 DIAGNOSIS — D509 Iron deficiency anemia, unspecified: Secondary | ICD-10-CM | POA: Diagnosis present

## 2023-03-15 DIAGNOSIS — I130 Hypertensive heart and chronic kidney disease with heart failure and stage 1 through stage 4 chronic kidney disease, or unspecified chronic kidney disease: Principal | ICD-10-CM | POA: Diagnosis present

## 2023-03-15 DIAGNOSIS — Z880 Allergy status to penicillin: Secondary | ICD-10-CM

## 2023-03-15 DIAGNOSIS — I10 Essential (primary) hypertension: Secondary | ICD-10-CM | POA: Diagnosis present

## 2023-03-15 DIAGNOSIS — E782 Mixed hyperlipidemia: Secondary | ICD-10-CM | POA: Diagnosis present

## 2023-03-15 DIAGNOSIS — Z8673 Personal history of transient ischemic attack (TIA), and cerebral infarction without residual deficits: Secondary | ICD-10-CM

## 2023-03-15 DIAGNOSIS — I509 Heart failure, unspecified: Principal | ICD-10-CM

## 2023-03-15 DIAGNOSIS — N1831 Chronic kidney disease, stage 3a: Secondary | ICD-10-CM | POA: Diagnosis present

## 2023-03-15 DIAGNOSIS — Z8711 Personal history of peptic ulcer disease: Secondary | ICD-10-CM

## 2023-03-15 HISTORY — DX: Acute and chronic respiratory failure with hypoxia: J96.21

## 2023-03-15 LAB — COMPREHENSIVE METABOLIC PANEL WITH GFR
ALT: 14 U/L (ref 0–44)
AST: 17 U/L (ref 15–41)
Albumin: 3.3 g/dL — ABNORMAL LOW (ref 3.5–5.0)
Alkaline Phosphatase: 60 U/L (ref 38–126)
Anion gap: 11 (ref 5–15)
BUN: 34 mg/dL — ABNORMAL HIGH (ref 8–23)
CO2: 24 mmol/L (ref 22–32)
Calcium: 8.6 mg/dL — ABNORMAL LOW (ref 8.9–10.3)
Chloride: 101 mmol/L (ref 98–111)
Creatinine, Ser: 1.35 mg/dL — ABNORMAL HIGH (ref 0.44–1.00)
GFR, Estimated: 40 mL/min — ABNORMAL LOW
Glucose, Bld: 144 mg/dL — ABNORMAL HIGH (ref 70–99)
Potassium: 3.3 mmol/L — ABNORMAL LOW (ref 3.5–5.1)
Sodium: 136 mmol/L (ref 135–145)
Total Bilirubin: 0.5 mg/dL (ref 0.3–1.2)
Total Protein: 6.1 g/dL — ABNORMAL LOW (ref 6.5–8.1)

## 2023-03-15 LAB — CBC
HCT: 24.1 % — ABNORMAL LOW (ref 36.0–46.0)
Hemoglobin: 7 g/dL — ABNORMAL LOW (ref 12.0–15.0)
MCH: 23.3 pg — ABNORMAL LOW (ref 26.0–34.0)
MCHC: 29 g/dL — ABNORMAL LOW (ref 30.0–36.0)
MCV: 80.1 fL (ref 80.0–100.0)
Platelets: 238 K/uL (ref 150–400)
RBC: 3.01 MIL/uL — ABNORMAL LOW (ref 3.87–5.11)
RDW: 15.9 % — ABNORMAL HIGH (ref 11.5–15.5)
WBC: 6.2 K/uL (ref 4.0–10.5)
nRBC: 0 % (ref 0.0–0.2)

## 2023-03-15 LAB — RESP PANEL BY RT-PCR (RSV, FLU A&B, COVID)  RVPGX2
Influenza A by PCR: NEGATIVE
Influenza B by PCR: NEGATIVE
Resp Syncytial Virus by PCR: NEGATIVE
SARS Coronavirus 2 by RT PCR: NEGATIVE

## 2023-03-15 LAB — GLUCOSE, CAPILLARY
Glucose-Capillary: 176 mg/dL — ABNORMAL HIGH (ref 70–99)
Glucose-Capillary: 202 mg/dL — ABNORMAL HIGH (ref 70–99)

## 2023-03-15 LAB — BLOOD GAS, VENOUS
Acid-Base Excess: 3.8 mmol/L — ABNORMAL HIGH (ref 0.0–2.0)
Bicarbonate: 29.2 mmol/L — ABNORMAL HIGH (ref 20.0–28.0)
O2 Saturation: 41.2 %
Patient temperature: 37
pCO2, Ven: 46 mmHg (ref 44–60)
pH, Ven: 7.41 (ref 7.25–7.43)
pO2, Ven: 32 mmHg (ref 32–45)

## 2023-03-15 LAB — TROPONIN I (HIGH SENSITIVITY)
Troponin I (High Sensitivity): 6 ng/L (ref <18)
Troponin I (High Sensitivity): 8 ng/L (ref <18)

## 2023-03-15 LAB — BRAIN NATRIURETIC PEPTIDE: B Natriuretic Peptide: 619 pg/mL — ABNORMAL HIGH (ref 0.0–100.0)

## 2023-03-15 MED ORDER — INSULIN GLARGINE-YFGN 100 UNIT/ML ~~LOC~~ SOLN
9.0000 [IU] | Freq: Every day | SUBCUTANEOUS | Status: DC
  Administered 2023-03-16 – 2023-03-19 (×4): 9 [IU] via SUBCUTANEOUS
  Filled 2023-03-15 (×4): qty 0.09

## 2023-03-15 MED ORDER — IPRATROPIUM-ALBUTEROL 0.5-2.5 (3) MG/3ML IN SOLN
3.0000 mL | Freq: Once | RESPIRATORY_TRACT | Status: AC
  Administered 2023-03-15: 3 mL via RESPIRATORY_TRACT
  Filled 2023-03-15: qty 3

## 2023-03-15 MED ORDER — DOCUSATE SODIUM 100 MG PO CAPS: 100.0000 mg | ORAL_CAPSULE | Freq: Two times a day (BID) | ORAL | Status: DC | PRN

## 2023-03-15 MED ORDER — OXYCODONE-ACETAMINOPHEN 5-325 MG PO TABS
1.0000 | ORAL_TABLET | Freq: Four times a day (QID) | ORAL | Status: DC | PRN
  Administered 2023-03-16 – 2023-03-17 (×2): 2 via ORAL
  Administered 2023-03-17: 1 via ORAL
  Administered 2023-03-18 (×3): 2 via ORAL
  Filled 2023-03-15 (×2): qty 2
  Filled 2023-03-15: qty 1
  Filled 2023-03-15: qty 2
  Filled 2023-03-15: qty 1
  Filled 2023-03-15: qty 2
  Filled 2023-03-15: qty 1

## 2023-03-15 MED ORDER — HYDRALAZINE HCL 20 MG/ML IJ SOLN: 10.0000 mg | Freq: Four times a day (QID) | INTRAMUSCULAR | Status: DC | PRN

## 2023-03-15 MED ORDER — PREDNISONE 20 MG PO TABS
40.0000 mg | ORAL_TABLET | Freq: Every day | ORAL | Status: DC
  Administered 2023-03-16 – 2023-03-18 (×3): 40 mg via ORAL
  Filled 2023-03-15 (×3): qty 2

## 2023-03-15 MED ORDER — ASPIRIN 81 MG PO TBEC
81.0000 mg | DELAYED_RELEASE_TABLET | Freq: Every day | ORAL | Status: DC
  Administered 2023-03-16 – 2023-03-19 (×4): 81 mg via ORAL
  Filled 2023-03-15 (×4): qty 1

## 2023-03-15 MED ORDER — POTASSIUM CHLORIDE CRYS ER 20 MEQ PO TBCR
40.0000 meq | EXTENDED_RELEASE_TABLET | Freq: Once | ORAL | Status: AC
  Administered 2023-03-15: 40 meq via ORAL
  Filled 2023-03-15: qty 2

## 2023-03-15 MED ORDER — FUROSEMIDE 10 MG/ML IJ SOLN
40.0000 mg | Freq: Once | INTRAMUSCULAR | Status: AC
  Administered 2023-03-15: 40 mg via INTRAVENOUS
  Filled 2023-03-15: qty 4

## 2023-03-15 MED ORDER — ONDANSETRON 4 MG PO TBDP: 4.0000 mg | ORAL_TABLET | Freq: Three times a day (TID) | ORAL | Status: DC | PRN

## 2023-03-15 MED ORDER — ONDANSETRON HCL 4 MG/2ML IJ SOLN: 4.0000 mg | Freq: Four times a day (QID) | INTRAMUSCULAR | Status: DC | PRN

## 2023-03-15 MED ORDER — METHYLPREDNISOLONE SODIUM SUCC 125 MG IJ SOLR
125.0000 mg | Freq: Once | INTRAMUSCULAR | Status: AC
  Administered 2023-03-15: 125 mg via INTRAVENOUS
  Filled 2023-03-15: qty 2

## 2023-03-15 MED ORDER — GABAPENTIN 100 MG PO CAPS
200.0000 mg | ORAL_CAPSULE | Freq: Every evening | ORAL | Status: DC | PRN
  Administered 2023-03-16: 200 mg via ORAL
  Filled 2023-03-15: qty 2

## 2023-03-15 MED ORDER — INSULIN ASPART 100 UNIT/ML IJ SOLN
0.0000 [IU] | Freq: Three times a day (TID) | INTRAMUSCULAR | Status: DC
  Administered 2023-03-15: 3 [IU] via SUBCUTANEOUS
  Administered 2023-03-16: 15 [IU] via SUBCUTANEOUS
  Administered 2023-03-16: 5 [IU] via SUBCUTANEOUS
  Administered 2023-03-16: 3 [IU] via SUBCUTANEOUS
  Administered 2023-03-17: 2 [IU] via SUBCUTANEOUS
  Administered 2023-03-17 – 2023-03-18 (×4): 5 [IU] via SUBCUTANEOUS
  Administered 2023-03-18 – 2023-03-19 (×2): 2 [IU] via SUBCUTANEOUS
  Filled 2023-03-15 (×11): qty 1

## 2023-03-15 MED ORDER — IPRATROPIUM-ALBUTEROL 0.5-2.5 (3) MG/3ML IN SOLN
3.0000 mL | Freq: Four times a day (QID) | RESPIRATORY_TRACT | Status: DC
  Administered 2023-03-15 – 2023-03-16 (×2): 3 mL via RESPIRATORY_TRACT
  Filled 2023-03-15 (×2): qty 3

## 2023-03-15 MED ORDER — CARVEDILOL 25 MG PO TABS
25.0000 mg | ORAL_TABLET | Freq: Two times a day (BID) | ORAL | Status: DC
  Administered 2023-03-15 – 2023-03-19 (×8): 25 mg via ORAL
  Filled 2023-03-15 (×8): qty 1

## 2023-03-15 MED ORDER — ALLOPURINOL 100 MG PO TABS
100.0000 mg | ORAL_TABLET | Freq: Every day | ORAL | Status: DC
  Administered 2023-03-16 – 2023-03-19 (×4): 100 mg via ORAL
  Filled 2023-03-15 (×4): qty 1

## 2023-03-15 MED ORDER — AMLODIPINE BESYLATE 5 MG PO TABS
2.5000 mg | ORAL_TABLET | Freq: Every day | ORAL | Status: DC
  Administered 2023-03-16 – 2023-03-19 (×4): 2.5 mg via ORAL
  Filled 2023-03-15 (×4): qty 1

## 2023-03-15 MED ORDER — APIXABAN 5 MG PO TABS
5.0000 mg | ORAL_TABLET | Freq: Two times a day (BID) | ORAL | Status: DC
  Administered 2023-03-15 – 2023-03-19 (×8): 5 mg via ORAL
  Filled 2023-03-15 (×8): qty 1

## 2023-03-15 MED ORDER — POLYETHYLENE GLYCOL 3350 17 G PO PACK: 17.0000 g | PACK | Freq: Two times a day (BID) | ORAL | Status: DC | PRN

## 2023-03-15 MED ORDER — CARBAMAZEPINE 100 MG PO CHEW: 100.0000 mg | CHEWABLE_TABLET | Freq: Two times a day (BID) | ORAL | Status: DC

## 2023-03-15 NOTE — ED Notes (Signed)
Pt decreased to 4L [place]

## 2023-03-15 NOTE — H&P (Addendum)
History and Physical    Kelly Rowland [id number] DOB: [birth year] DOA: 03/15/2023  PCP: Volanda Napoleon, MD  Patient coming from: home  I have personally briefly reviewed patient's old medical records in [place]  Chief Complaint: dyspnea  HPI: Kelly Rowland is a 82 y.o. 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   Pt reported acute onset dyspnea on Sat night (3/30), feeling pressure around her chest making breathing difficult.  Lying down didn't make a difference.  No cough, no recent URI.  Pt reported BLE which is chronic and for which she takes lasix at home.  No fever, abdominal pain, N/V/D, dysuria.  Pt has hx of DVTs and missed only 1 dose of Eliquis recently.    ED Course: afebrile, pulse 68, BP 169/59, RR 15, sating 78% on 3L O2, so increased to 6L O2.  Labs notable for normal WBC, Hgb 7.0, Cr 1.35, BNP 619, trop neg x2.  COVID and Flu neg.  CXR showed "Small bilateral pleural effusions appear minimally increased from 02/04/2023".  Pt received DuoNeb x3, and IV solumedrol 125 mg in the ED, and by the time of admission, dyspnea was already improved.   Assessment/Plan  # Acute on chronic hypoxemic respiratory failure # 3L O2 at baseline --pt was sating 78% on 3L O2, with respiratory distress.  Needed 6L O2 to maintain sats around 92%.  Pt reported subjective improvement in dyspnea after DuoNeb and Solumedrol, so likely has COPD exacerbation.  Increased pleural effusion, increased BNP and BLE swelling also suggest CHF exacerbation.  Not as likely to be PE since pt is on home Eliquis. --treat both --Continue supplemental O2 to keep sats >=90%, wean as tolerated  # COPD exacerbation --pt didn't have cough or sputum production, but dyspnea improved quickly with DuoNeb and Solumedrol, so will treat as COPD exacerbation. --cont steroid as  prednisone 40 mg daily --DuoNeb QID --will provide Neb machine at discharge.  # Acute congestive heart failure with preserved ejection fraction  --Small bilateral pleural effusions on CXR, BNP 619 increased from 146 about 3 months ago.  BLE edema present. 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  Trop neg x2. --cont home ASA --resume statin after discharge  Chronic anemia --History of peptic ulcer disease, most recent admission in November 2023 where she was treated for bleeding duodenal ulcer.  Anemia workup in Nov 2023 didn't show def.  No active GI bleeding at this time, though pt did report several episode of nose bleed in the past week. --Hgb 7.0 on presentation.  Pt wants to hold off blood transfusion --Monitor Hgb  Hx of DVT --cont home Eliquis  CKD 3a --Cr at baseline  DM2 --cont glargine 9u daily --ACHS and SSI  HTN --cont home amlodipine, coreg   DVT prophylaxis: [id number] Code Status: Full code  Family Communication:   Disposition Plan: home  Consults called: none Level of care: Med-Surg   Review of Systems: As per HPI otherwise complete review of systems negative.   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   Stroke     Past Surgical History:  Procedure Laterality Date   ABDOMINAL HYSTERECTOMY     APPENDECTOMY     CAROTID ANGIOGRAPHY Bilateral 11/02/2022   Procedure:  CAROTID ANGIOGRAPHY;  Surgeon: Algernon Huxley, MD;  Location: [place] [facility];  Service: Cardiovascular;  Laterality: Bilateral;   CATARACT EXTRACTION Bilateral 2019   COLONOSCOPY N/A 10/22/2022   Procedure: COLONOSCOPY;  Surgeon: Lucilla Lame, MD;  Location: [facility];  Service: Endoscopy;  Laterality: N/A;   ENDARTERECTOMY Left 11/16/2022   Procedure: ENDARTERECTOMY CAROTID;  Surgeon: Algernon Huxley, MD;  Location: [facility];  Service: Vascular;  Laterality: Left;   ESOPHAGOGASTRODUODENOSCOPY (EGD) WITH PROPOFOL N/A 10/18/2022   Procedure: ESOPHAGOGASTRODUODENOSCOPY (EGD) WITH PROPOFOL;  Surgeon: Lin Landsman, MD;  Location: [facility];  Service: Gastroenterology;  Laterality: N/A;   ESOPHAGOGASTRODUODENOSCOPY (EGD) WITH PROPOFOL N/A 10/22/2022   Procedure: ESOPHAGOGASTRODUODENOSCOPY (EGD) WITH PROPOFOL;  Surgeon: Lucilla Lame, MD;  Location: [facility];  Service: Endoscopy;  Laterality: N/A;   ESOPHAGOGASTRODUODENOSCOPY (EGD) WITH PROPOFOL N/A 11/04/2022   Procedure: ESOPHAGOGASTRODUODENOSCOPY (EGD) WITH PROPOFOL;  Surgeon: Lin Landsman, MD;  Location: [facility];  Service: Gastroenterology;  Laterality: N/A;   EYE SURGERY     TONSILLECTOMY       reports that she quit smoking about 5 years ago. Her smoking use included cigarettes. She has a 50.00 pack-year smoking history. She has never used smokeless tobacco. She reports that she does not drink alcohol and does not use drugs.  Allergies  Allergen Reactions   Amoxicillin Anaphylaxis   Lisinopril Anaphylaxis   Tramadol Nausea Only   Colchicine Nausea Only    Family History  Problem Relation Age of Onset   Hypertension Mother    Heart disease Mother    Diabetes Mother    Hypertension Father    Heart disease Father     Prior to Admission medications   Medication Sig Start Date End Date Taking? Authorizing Provider  allopurinol (ZYLOPRIM) 100 MG tablet Take 100 mg by mouth daily. 05/26/22  Yes  [provider]  amLODipine (NORVASC) 2.5 MG tablet Take 2.5 mg by mouth daily.   Yes [provider]  apixaban (ELIQUIS) 5 MG TABS tablet Take 2 tablets (10 mg total) by mouth 2 (two) times daily for 2 days, THEN 1 tablet (5 mg total) 2 (two) times daily for 28 days. 12/03/22 03/15/23 Yes Amin, Jeanella Flattery, MD  aspirin 81 MG EC tablet Take 81 mg by mouth daily.   Yes [provider]  atorvastatin (LIPITOR) 40 MG tablet Take 1 tablet (40 mg total) by mouth at bedtime. 11/04/22  Yes Sharen Hones, MD  carvedilol (COREG) 25 MG tablet Take 25 mg by mouth 2 (two) times daily. 11/13/21  Yes [provider]  Cholecalciferol 50 MCG (2000 UT) CAPS Take 1 capsule by mouth daily.   Yes [provider]  fluticasone-salmeterol (ADVAIR) 250-50 MCG/ACT AEPB Inhale 1 puff into the lungs 2 (two) times daily. 11/03/21  Yes [provider]  folic acid (FOLVITE) A999333 MCG tablet Take 1 tablet by mouth daily.   Yes [provider]  furosemide (LASIX) 40 MG tablet Take 40 mg by mouth daily.   Yes [provider]  gabapentin (NEURONTIN) 100 MG capsule Take 100 mg by mouth at bedtime. Patients stats her orthopedic just prescribed this medication this past Thursday. She takes is at night at 0800PM as needed   Yes [provider]  insulin glargine-yfgn (SEMGLEE) 100 UNIT/ML injection Inject 0.12 mLs (12 Units total) into the skin daily. Patient taking differently: Inject 9 Units into the skin daily. 11/04/22  Yes Sharen Hones, MD  insulin NPH Human (NOVOLIN N) 100 UNIT/ML injection Inject into the skin. 11/08/20  Yes [provider]  KLOR-CON M20 20 MEQ tablet Take 20 mEq by mouth daily. 09/25/21  Yes [provider]  NOVOLOG FLEXPEN 100 UNIT/ML FlexPen Inject subcutaneously 3 times daily before meals and at bedtime per sliding scale 11/13/22  Yes [provider]  ondansetron (ZOFRAN) 4 MG tablet Take 4 mg by mouth every 8  (eight) hours as needed.   Yes [provider]  oxyCODONE-acetaminophen (PERCOCET/ROXICET) 5-325 MG tablet Take 1-2 tablets by mouth every 6 (six) hours as needed. 12/01/22  Yes Amin, Ankit Chirag, MD  TRESIBA FLEXTOUCH 100 UNIT/ML FlexTouch Pen Inject 9 Units into the skin daily. 02/02/23  Yes [provider]  VENTOLIN HFA 108 (90 Base) MCG/ACT inhaler Inhale 2 puffs into the lungs every 4 (four) hours as needed for shortness of breath, wheezing or cough. 01/11/17  Yes [provider]  vitamin B-12 (CYANOCOBALAMIN) 500 MCG tablet Take 1 tablet by mouth daily.   Yes [provider]  amLODipine (NORVASC) 5 MG tablet Take 5 mg by mouth daily. Patient not taking: Reported on 03/15/2023 03/01/17   [provider]  carbamazepine (TEGRETOL) 100 MG chewable tablet Chew 100 mg by mouth in the morning and at bedtime. Patient not taking: Reported on 03/15/2023 09/09/22   [provider]  clopidogrel (PLAVIX) 75 MG tablet Take 75 mg by mouth daily. Patient not taking: Reported on 02/04/2023    [provider]  cyclobenzaprine (FLEXERIL) 10 MG tablet Take 10 mg by mouth 3 (three) times daily as needed for muscle spasms. Patient not taking: Reported on 02/04/2023 06/14/12   [provider]  fluticasone (FLOVENT HFA) 110 MCG/ACT inhaler Inhale 2 puffs into the lungs. Patient not taking: Reported on 03/15/2023    [provider]  gabapentin (NEURONTIN) 300 MG capsule Take 300 mg by mouth 3 (three) times daily. Patient not taking: Reported on 03/15/2023 10/15/22   [provider]  glimepiride (AMARYL) 2 MG tablet Take 2 mg by mouth daily. Patient not taking: Reported on 03/15/2023    [provider]  lidocaine (LIDODERM) 5 % Place 1 patch onto the skin daily. Remove & Discard patch within 12 hours or as directed by MD Patient not taking: Reported on 03/15/2023 11/04/22   Sharen Hones, MD  lisinopril-hydrochlorothiazide (ZESTORETIC)  20-12.5 MG tablet Take 1 tablet by mouth daily. Patient not taking: Reported on 03/15/2023    [provider]  metFORMIN (GLUCOPHAGE-XR) 500 MG 24 hr tablet 500 mg daily with breakfast. Patient not taking: Reported on 03/15/2023    [provider]  metoprolol succinate (TOPROL-XL) 100 MG 24 hr tablet 100 mg. Patient not taking: Reported on 02/04/2023    [provider]  Multiple Vitamin (DAILY VITE PO) Take 1 tablet by mouth daily. Patient not taking: Reported on 03/15/2023    [provider]  pantoprazole (PROTONIX) 40 MG tablet Take 1 tablet (40  mg total) by mouth 2 (two) times daily. Patient not taking: Reported on 03/15/2023 11/03/22   Sharen Hones, MD  pioglitazone (ACTOS) 15 MG tablet Take 1 tablet by mouth daily. Patient not taking: Reported on 02/04/2023 07/17/20   [provider]  senna (SENOKOT) 8.6 MG TABS tablet Take 1 tablet (8.6 mg total) by mouth 2 (two) times daily. Patient not taking: Reported on 03/15/2023 11/03/22   Sharen Hones, MD  simvastatin (ZOCOR) 20 MG tablet Take 20 mg by mouth. Patient not taking: Reported on 03/15/2023    [provider]  sucralfate (CARAFATE) 1 g tablet Take 1 g by mouth 3 (three) times daily before meals. Patient not taking: Reported on 03/15/2023 11/19/22   [provider]  tiZANidine (ZANAFLEX) 4 MG tablet Take 4 mg by mouth every 6 (six) hours as needed. Patient not taking: Reported on 02/04/2023    [provider]  triamterene-hydrochlorothiazide (MAXZIDE-25) 37.5-25 MG tablet Take 1 tablet by mouth daily. Patient not taking: Reported on 03/15/2023 11/25/22   [provider]    Physical Exam: Vitals:   03/15/23 1231 03/15/23 1400 03/15/23 1530 03/15/23 1707  BP: (!) 169/59 (!) 155/48 (!) 150/55 (!) 176/60  Pulse:  64 63 74  Resp:  14 17 19   Temp:    (!) 97.4 F (36.3 C)  TempSrc:      SpO2:  96% 96% 94%    Constitutional: NAD, AAOx3 HEENT: conjunctivae and lids normal,  EOMI CV: No cyanosis.   RESP: no wheezing, on 6L sating 92% Extremities: edema in BLE SKIN: warm, dry Neuro: II - XII grossly intact.   Psych: Normal mood and affect.  Appropriate judgement and reason  Labs on Admission: I have personally reviewed labs and imaging studies  Time spent: 75 minutes  Enzo Bi MD [facility]  If 7PM-7AM, please contact night-coverage 03/15/2023, 7:57 PM

## 2023-03-15 NOTE — ED Provider Notes (Signed)
[facility] Provider Note    Event Date/Time   First MD Initiated Contact with Patient 03/15/23 1226     (approximate)   History   Shortness of Breath   HPI  Kelly Rowland is a 82 y.o. female  CVAs without COPD, hypertension, recent endarterectomy who comes in with shortness of breath.  Patient reports that she is typically on 3 L of nasal cannula for her COPD.  She reports that she has noticed since Saturday increasing shortness of breath worse with exertion.  She notes her oxygen levels have been low in the 80s which is atypical for her.  She does report history of DVT back in December.  I reviewed this note where patient refused CTA as she did not want contrast but she reports being on Eliquis not missing any doses.  She does report some bilateral swelling in her legs that is baseline for her.  She does report being compliant with her Lasix.  She does report also a history of anemia related to peptic ulcer disease requiring blood transfusions.  Patient denies any headaches, confusions, denies any falls in the last few days.   Physical Exam   Triage Vital Signs: ED Triage Vitals  Enc Vitals Group     BP 03/15/23 1231 (!) 169/59     Pulse Rate 03/15/23 1229 68     Resp 03/15/23 1229 15     Temp 03/15/23 1229 98 F (36.7 C)     Temp Source 03/15/23 1229 Oral     SpO2 03/15/23 1229 (!) 78 %     Weight --      Height --      Head Circumference --      Peak Flow --      Pain Score 03/15/23 1229 0     Pain Loc --      Pain Edu? --      Excl. in [place]? --     Most recent vital signs: Vitals:   03/15/23 1229 03/15/23 1231  BP:  (!) 169/59  Pulse: 68   Resp: 15   Temp: 98 F (36.7 C)   SpO2: (!) 78%      General: Awake, no distress.  CV:  Good peripheral perfusion.  Resp:  Normal effort.  No obvious wheezing Abd:  No distention.  Soft nontender Other:  Edema noted bilaterally without any calf tenderness.   ED Results / Procedures / Treatments    Labs (all labs ordered are listed, but only abnormal results are displayed) Labs Reviewed  RESP PANEL BY RT-PCR (RSV, FLU A&B, COVID)  [id number]  BASIC METABOLIC PANEL  CBC  BLOOD GAS, VENOUS  TROPONIN I (HIGH SENSITIVITY)     EKG  My interpretation of EKG:  Sinus rate of 68 without any ST elevation or T wave inversions, normal intervals  RADIOLOGY I have reviewed the xray personally and interpreted   PROCEDURES:  Critical Care performed: Yes, see critical care procedure note(s)  .1-3 Lead EKG Interpretation  Performed by: Vanessa [place], MD Authorized by: Vanessa [place], MD     Interpretation: abnormal     ECG rate:  60   ECG rate assessment: normal     Rhythm: sinus rhythm     Ectopy: none     Conduction: normal   .Critical Care  Performed by: Vanessa [place], MD Authorized by: Vanessa [place], MD   Critical care provider statement:    Critical care time (minutes):  30   Critical care was necessary to treat or prevent imminent or life-threatening deterioration of the following conditions:  Respiratory failure   Critical care was time spent personally by me on the following activities:  Development of treatment plan with patient or surrogate, discussions with consultants, evaluation of patient's response to treatment, examination of patient, ordering and review of laboratory studies, ordering and review of radiographic studies, ordering and performing treatments and interventions, pulse oximetry, re-evaluation of patient's condition and review of old [place] ED: Medications  ipratropium-albuterol (DUONEB) 0.5-2.5 (3) MG/3ML nebulizer solution 3 mL (has no administration in time range)  ipratropium-albuterol (DUONEB) 0.5-2.5 (3) MG/3ML nebulizer solution 3 mL (has no administration in time range)  methylPREDNISolone sodium succinate (SOLU-MEDROL) 125 mg/2 mL injection 125 mg (has no administration in time range)  ipratropium-albuterol (DUONEB)  0.5-2.5 (3) MG/3ML nebulizer solution 3 mL (3 mLs Nebulization Given 03/15/23 1318)     IMPRESSION / MDM / [place] / ED COURSE  I reviewed the triage vital signs and the nursing notes.   Patient's presentation is most consistent with severe exacerbation of chronic illness.   Differential includes CHF, anemia, COPD, ACS.  Doubt PE given patient reports being compliant with Eliquis.  Patient hypoxic to 78% on her baseline 3 L had to be turned up to 6 L and was able to gradually decrease to 4 L.  This also happened with EMS and remained hypoxic with [place] here therefore patient is having new oxygen requirement.  Will trial a DuoNeb although I do not hear any obvious wheezing.  CBC shows low hemoglobin of 7 but she denies any black tarry stools or blood in her stool.  Did do a rectal exam it was Hemoccult negative. Troponin was negative. VBG without any evidence of hypercapnia COVID, flu were negative CMP reassuring will give some oral potassium.  IMPRESSION: 1. Small bilateral pleural effusions appear minimally increased from 02/04/2023. Associated mild bibasilar atelectasis. 2. Stable cardiomegaly. 3. Emphysema.  Discussed with patient given the swelling her legs is suspect this is most likely CHF I have also treated her for possible COPD although no significant wheezing on examination.  Given her recurrent episodes of low oxygen levels from her baseline she feels comfortable with admission for IV diuresis and monitoring.  The patient is on the cardiac monitor to evaluate for evidence of arrhythmia and/or significant heart rate changes.      FINAL CLINICAL IMPRESSION(S) / ED DIAGNOSES   Final diagnoses:  Acute on chronic congestive heart failure, unspecified heart failure type  Chronic obstructive pulmonary disease, unspecified COPD type  Acute respiratory failure with hypoxia     Rx / DC Orders   ED Discharge Orders     None        Note:  This document was  prepared using Dragon voice recognition software and may include unintentional dictation errors.   Vanessa [place], MD 03/15/23 [phone]

## 2023-03-15 NOTE — ED Triage Notes (Addendum)
Pt to Ed via POV from home. Pt reports increased SOB and CP with exertion that started Saturday. Pt reports hx DVTs and is on Eliquis. EMS states on FD arrival pt sats in the 70s and placed on NRB. EMS placed pt on 4L [place] with sats 93-94%. Pt wears 3L [place] chronically. Pt sats 78% on 3L [place] - pt taken to room 8. Pt placed on 6L [place] and MD at bedside.

## 2023-03-16 DIAGNOSIS — J9621 Acute and chronic respiratory failure with hypoxia: Secondary | ICD-10-CM | POA: Diagnosis not present

## 2023-03-16 LAB — GLUCOSE, CAPILLARY
Glucose-Capillary: 225 mg/dL — ABNORMAL HIGH (ref 70–99)
Glucose-Capillary: 388 mg/dL — ABNORMAL HIGH (ref 70–99)
Glucose-Capillary: 173 mg/dL — ABNORMAL HIGH (ref 70–99)
Glucose-Capillary: 110 mg/dL — ABNORMAL HIGH (ref 70–99)

## 2023-03-16 LAB — CBC
HCT: 23.2 % — ABNORMAL LOW (ref 36.0–46.0)
Hemoglobin: 6.8 g/dL — ABNORMAL LOW (ref 12.0–15.0)
MCH: 23.1 pg — ABNORMAL LOW (ref 26.0–34.0)
MCHC: 29.3 g/dL — ABNORMAL LOW (ref 30.0–36.0)
MCV: 78.6 fL — ABNORMAL LOW (ref 80.0–100.0)
Platelets: 237 10*3/uL (ref 150–400)
RBC: 2.95 MIL/uL — ABNORMAL LOW (ref 3.87–5.11)
RDW: 15.7 % — ABNORMAL HIGH (ref 11.5–15.5)
WBC: 4.2 10*3/uL (ref 4.0–10.5)
nRBC: 0 % (ref 0.0–0.2)

## 2023-03-16 LAB — BASIC METABOLIC PANEL
Anion gap: 11 (ref 5–15)
BUN: 35 mg/dL — ABNORMAL HIGH (ref 8–23)
CO2: 23 mmol/L (ref 22–32)
Calcium: 8.8 mg/dL — ABNORMAL LOW (ref 8.9–10.3)
Chloride: 100 mmol/L (ref 98–111)
Creatinine, Ser: 1.36 mg/dL — ABNORMAL HIGH (ref 0.44–1.00)
GFR, Estimated: 40 mL/min — ABNORMAL LOW (ref >60)
Glucose, Bld: 263 mg/dL — ABNORMAL HIGH (ref 70–99)
Potassium: 3.6 mmol/L (ref 3.5–5.1)
Sodium: 134 mmol/L — ABNORMAL LOW (ref 135–145)

## 2023-03-16 LAB — MAGNESIUM: Magnesium: 1.8 mg/dL (ref 1.7–2.4)

## 2023-03-16 LAB — PREPARE RBC (CROSSMATCH)

## 2023-03-16 MED ORDER — METHOCARBAMOL 500 MG PO TABS
750.0000 mg | ORAL_TABLET | Freq: Every day | ORAL | Status: DC
  Administered 2023-03-16 – 2023-03-18 (×3): 750 mg via ORAL
  Filled 2023-03-16 (×3): qty 2

## 2023-03-16 MED ORDER — ROPINIROLE HCL 1 MG PO TABS
1.0000 mg | ORAL_TABLET | Freq: Every day | ORAL | Status: DC
  Administered 2023-03-16 – 2023-03-18 (×3): 1 mg via ORAL
  Filled 2023-03-16 (×4): qty 1

## 2023-03-16 MED ORDER — METHOCARBAMOL 1000 MG/10ML IJ SOLN
500.0000 mg | Freq: Once | INTRAVENOUS | Status: AC
  Administered 2023-03-16: 500 mg via INTRAVENOUS
  Filled 2023-03-16: qty 500

## 2023-03-16 MED ORDER — FUROSEMIDE 10 MG/ML IJ SOLN
40.0000 mg | Freq: Once | INTRAMUSCULAR | Status: AC
  Administered 2023-03-16: 40 mg via INTRAVENOUS
  Filled 2023-03-16: qty 4

## 2023-03-16 MED ORDER — INSULIN ASPART 100 UNIT/ML IJ SOLN
3.0000 [IU] | Freq: Three times a day (TID) | INTRAMUSCULAR | Status: DC
  Administered 2023-03-16 – 2023-03-19 (×9): 3 [IU] via SUBCUTANEOUS
  Filled 2023-03-16 (×9): qty 1

## 2023-03-16 MED ORDER — IPRATROPIUM-ALBUTEROL 0.5-2.5 (3) MG/3ML IN SOLN
3.0000 mL | Freq: Three times a day (TID) | RESPIRATORY_TRACT | Status: DC
  Administered 2023-03-16 – 2023-03-17 (×3): 3 mL via RESPIRATORY_TRACT
  Filled 2023-03-16 (×3): qty 3

## 2023-03-16 MED ORDER — SODIUM CHLORIDE 0.9% IV SOLUTION: Freq: Once | INTRAVENOUS | Status: AC

## 2023-03-16 MED ORDER — ROPINIROLE HCL 1 MG PO TABS
1.0000 mg | ORAL_TABLET | Freq: Once | ORAL | Status: AC
  Administered 2023-03-16: 1 mg via ORAL
  Filled 2023-03-16: qty 1

## 2023-03-16 NOTE — Plan of Care (Signed)

## 2023-03-16 NOTE — TOC Initial Note (Signed)
Transition of Care [facility]) - Initial/Assessment Note    Patient Details  Name: Kelly Rowland MRN: [id number] Date of Birth: 1941/07/08  Transition of Care [facility]) CM/SW Contact:    Candie Chroman, LCSW Phone Number: 03/16/2023, 12:23 PM  Clinical Narrative:  Readmission prevention screen complete. CSW met with patient. No supports at bedside. CSW introduced role and explained that discharge planning would be discussed. PCP is Laray Anger, MD. Sister transports her to appointments. [facility] [place]. No issues obtaining medications. Patient confirmed she is active with [facility] Per liaison she is receiving PT and OT. Patient is on 3 L oxygen at home ([employer]). She also has a rollator and shower chair. Patient is requesting a nebulizer machine at discharge. PT evaluation is pending. Patient is agreeable to SNF if recommended. Her first preference is [facility] as she has been there in the past. Received phone call from son this morning. He voiced concern about patient returning home and potentially needing LTC. Son's first preference SNF is [facility] in [place] because patient's sister lives nearby and they have friends/family that have had positive experiences there. Patient is AOx4. She is agreeable to rehab if recommending by PT but does not want LTC. Will follow up once PT recommendations are made.  Expected Discharge Plan:  (TBD) Barriers to Discharge: Continued Medical Work up   Patient Goals and CMS Choice            Expected Discharge Plan and Services     Post Acute Care Choice:  (TBD) Living arrangements for the past 2 months: Single Family Home                                      Prior Living Arrangements/Services Living arrangements for the past 2 months: Single Family Home Lives with:: Self Patient language and need for interpreter reviewed:: Yes Do you feel safe going back to the place where you live?: Yes      Need for  Family Participation in Patient Care: Yes (Comment)   Current home services: DME Criminal Activity/Legal Involvement Pertinent to Current Situation/Hospitalization: No - Comment as needed  Activities of Daily Living Home Assistive Devices/Equipment: Dentures (specify type) ADL Screening (condition at time of admission) Patient's cognitive ability adequate to safely complete daily activities?: Yes Is the patient deaf or have difficulty hearing?: No Does the patient have difficulty seeing, even when wearing glasses/contacts?: No Does the patient have difficulty concentrating, remembering, or making decisions?: No Patient able to express need for assistance with ADLs?: Yes Does the patient have difficulty dressing or bathing?: No Independently performs ADLs?: No Communication: Independent Dressing (OT): Needs assistance Is this a change from baseline?: Pre-admission baseline Grooming: Independent Feeding: Independent Bathing: Needs assistance Is this a change from baseline?: Pre-admission baseline Toileting: Needs assistance Is this a change from baseline?: Pre-admission baseline In/Out Bed: Needs assistance Is this a change from baseline?: Pre-admission baseline Walks in Home: Needs assistance Is this a change from baseline?: Pre-admission baseline Does the patient have difficulty walking or climbing stairs?: No Weakness of Legs: Both Weakness of Arms/Hands: None  Permission Sought/Granted Permission sought to share information with : Facility Art therapist granted to share information with : Yes, Verbal Permission Granted  Share Information with NAME: Laurence Pirkey  Permission granted to share info w AGENCY: SNF's, [place] granted to share info  w Relationship: Son  Permission granted to share info w Contact Information: [phone]  Emotional Assessment Appearance:: Appears stated age Attitude/Demeanor/Rapport: Engaged,  Gracious Affect (typically observed): Accepting, Appropriate, Calm, Pleasant Orientation: : Oriented to Self, Oriented to Place, Oriented to  Time, Oriented to Situation Alcohol / Substance Use: Not Applicable Psych Involvement: No (comment)  Admission diagnosis:  Acute respiratory failure with hypoxia [J96.01] Acute on chronic respiratory failure with hypoxia [J96.21] Chronic obstructive pulmonary disease, unspecified COPD type [J44.9] Acute on chronic congestive heart failure, unspecified heart failure type [I50.9] Patient Active Problem List   Diagnosis Date Noted   Acute on chronic respiratory failure with hypoxia 03/15/2023   Malnutrition of moderate degree 11/26/2022   Acute respiratory failure with hypoxia 11/23/2022   COVID-19 virus infection 11/22/2022   DVT of popliteal vein 11/22/2022   Acute hypoxic respiratory failure 11/21/2022   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 10/30/2022   Chronic blood loss anemia 10/28/2022   Chronic kidney disease, stage 3a 10/28/2022   Carotid stenosis, symptomatic, with infarction 10/28/2022   Duodenal ulcer with hemorrhage 10/22/2022   Lumbosacral spondylosis without myelopathy 09/16/2022   Hyperparathyroidism due to renal insufficiency 02/19/2021   Intermittent claudication 02/13/2020   Essential hypertension 12/05/2019   Bilateral carotid artery stenosis 12/26/2018   Diabetes mellitus 11/22/2018   Moderate chronic obstructive pulmonary disease 07/26/2018   Peripheral arterial disease 11/09/2017   Restless legs 11/09/2017   Coronary artery disease 10/06/2017   Mixed hyperlipidemia 08/23/2017   Heart failure, unspecified 08/23/2017   Peripheral edema 08/19/2017   Osteopenia 06/26/2015   PCP:  Volanda Napoleon, MD [id number] [place] [address] [place] [address] Phone: [phone] Fax: [phone]     Social Determinants of Health (SDOH) Social  History: SDOH Screenings   Food Insecurity: No Food Insecurity (03/16/2023)  Housing: Low Risk  (03/16/2023)  Transportation Needs: No Transportation Needs (03/16/2023)  Utilities: Not At Risk (03/16/2023)  Depression (PHQ2-9): Low Risk  (09/16/2022)  Tobacco Use: Medium Risk (03/15/2023)   SDOH Interventions:     Readmission Risk Interventions    03/16/2023   12:19 PM 11/17/2022   10:57 AM  Readmission Risk Prevention Plan  Transportation Screening Complete Complete  PCP or Specialist Appt within 3-5 Days  Complete  HRI or Home Care Consult  Complete  Social Work Consult for [place] Planning/Counseling  Complete  Palliative Care Screening  Not Applicable  Medication Review Press photographer) Complete Complete  PCP or Specialist appointment within 3-5 days of discharge Complete   HRI or Home Care Consult Complete   SW Recovery Care/Counseling Consult Complete   Palliative Care Screening Not Applicable   Skilled Nursing Facility Complete

## 2023-03-16 NOTE — Progress Notes (Signed)
PT Cancellation Note  Patient Details Name: Kelly Rowland MRN: [id number] DOB: 12/11/1941   Cancelled Treatment:    Reason Eval/Treat Not Completed: Patient declined, no reason specified. Patient reports she is too short of breath. She declines PT at this time. Will re-attempt PT eval tomorrow.    Trenna Kiely 03/16/2023, 11:55 AM

## 2023-03-16 NOTE — Progress Notes (Signed)
PROGRESS NOTE    Kelly Rowland  [id number] DOB: 1941/04/15 DOA: 03/15/2023 PCP: Volanda Napoleon, MD  147A/147A-AA  LOS: 1 day   Brief hospital course:   Assessment & Plan: Kelly Rowland is a 82 y.o. 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    # Acute on chronic hypoxemic respiratory failure # 3L O2 at baseline --on presentation, pt was sating 78% on 3L O2, with respiratory distress.  Needed 6L O2 to maintain sats around 92%.  Pt reported subjective improvement in dyspnea after DuoNeb and Solumedrol, so likely has COPD exacerbation.  Increased pleural effusion, increased BNP and BLE swelling also suggest CHF exacerbation.  Not as likely to be PE since pt is on home Eliquis. --treat both --Continue supplemental O2 to keep sats >=90%, wean as tolerated   # COPD exacerbation --pt didn't have cough or sputum production, but dyspnea improved quickly with DuoNeb and Solumedrol, so will treat as COPD exacerbation. --cont steroid as prednisone 40 mg daily --DuoNeb QID --will provide Neb machine at discharge.   # Acute congestive heart failure with preserved ejection fraction  --Small bilateral pleural effusions on CXR, BNP 619 increased from 146 about 3 months ago.  BLE edema present. --cont IV lasix 40 mg daily   history of CAD with chronically occluded right coronary artery with left-to-right collaterals  --no overt chest pain.  Trop neg x2. --cont home ASA --resume statin after discharge   Chronic anemia --History of peptic ulcer disease, most recent admission in November 2023 where she was treated for bleeding duodenal ulcer.  Anemia workup in Nov 2023 didn't show def.  No active GI bleeding at this time, though pt did report several episode of nose bleed in the past week. --1u pRBC today for Hgb 6.8 --monitor Hgb and transfuse to keep Hgb  >7   Hx of DVT --cont home Eliquis since anemia is chronic, and currently no evidence of GI bleed.   CKD 3a --Cr at baseline   DM2 --cont glargine 9u daily --add mealtime 3u TID --ACHS and SSI   HTN --cont home amlodipine, coreg  Restless leg, severe --start Requip nightly --start Robaxin nightly   DVT prophylaxis: [id number] Code Status: Full code  Family Communication:  Level of care: Med-Surg Dispo:   The patient is from: home Anticipated d/c is to: to be determined after PT eval Anticipated d/c date is: 2-3 days Patient currently is not medically ready to d/c due to: worsening hypoxia needing up to 5-6L O2 and severe DOE   Subjective and Interval History:  Pt complained of severe restless that kept her form sleeping.  Also complained of severe dyspnea with exertion.     Objective: Vitals:   03/16/23 1654 03/16/23 1654 03/16/23 2035 03/16/23 2050  BP: (!) 152/54 (!) 152/54 (!) 165/52   Pulse: 67 67 (!) 56   Resp: 17 17 20    Temp: 97.8 F (36.6 C) 97.8 F (36.6 C) 97.6 F (36.4 C)   TempSrc:      SpO2:  93% 95% 93%  Height:        Intake/Output Summary (Last 24 hours) at 03/16/2023 2125 Last data filed at 03/16/2023 1932 Gross per 24 hour  Intake 396.2 ml  Output 2100 ml  Net -1703.8 ml   There were no vitals filed for this visit.  Examination:   Constitutional: NAD, AAOx3,  sitting at edge of bed HEENT: conjunctivae and lids normal, EOMI CV: No cyanosis.   RESP: increased RR, on 5L Extremities: pitting edema in BLE SKIN: warm, dry Neuro: II - XII grossly intact.   Psych: depressed mood and affect.  Appropriate judgement and reason   Data Reviewed: I have personally reviewed labs and imaging studies  Time spent: 50 minutes  Enzo Bi, MD [facility] If 7PM-7AM, please contact night-coverage 03/16/2023, 9:25 PM

## 2023-03-16 NOTE — Inpatient Diabetes Management (Signed)
Inpatient Diabetes Program Recommendations  AACE/ADA: New Consensus Statement on Inpatient Glycemic Control (2015)  Target Ranges:  Prepandial:   less than 140 mg/dL      Peak postprandial:   less than 180 mg/dL (1-2 hours)      Critically ill patients:  140 - 180 mg/dL   Lab Results  Component Value Date   GLUCAP 225 (H) 03/16/2023   HGBA1C 7.2 (H) 10/17/2022    Review of Glycemic Control  Latest Reference Range & Units 03/15/23 17:42 03/15/23 21:16 03/16/23 07:51  Glucose-Capillary 70 - 99 mg/dL 176 (H) 202 (H) 225 (H)  (H): Data is abnormally high Diabetes history: Type 2 DM Outpatient Diabetes medications: Tresiba 9 units QD, Novolog SSI TID Current orders for Inpatient glycemic control:Semglee 9 units QD, Novolog 0-15 units TID Solumedrol 125 mg x 1 Prednisone 40 mg QD  Inpatient Diabetes Program Recommendations:   In the setting of steroids: Consider adding Novolog 3 units TID (assuming patient is consuming >50% of meals).   Thanks, Bronson Curb, MSN, RNC-OB Diabetes Coordinator [phone] (8a-5p)

## 2023-03-17 DIAGNOSIS — J9621 Acute and chronic respiratory failure with hypoxia: Secondary | ICD-10-CM | POA: Diagnosis not present

## 2023-03-17 LAB — BASIC METABOLIC PANEL
Anion gap: 13 (ref 5–15)
BUN: 41 mg/dL — ABNORMAL HIGH (ref 8–23)
CO2: 26 mmol/L (ref 22–32)
Calcium: 9 mg/dL (ref 8.9–10.3)
Chloride: 98 mmol/L (ref 98–111)
Creatinine, Ser: 1.28 mg/dL — ABNORMAL HIGH (ref 0.44–1.00)
GFR, Estimated: 43 mL/min — ABNORMAL LOW (ref >60)
Glucose, Bld: 148 mg/dL — ABNORMAL HIGH (ref 70–99)
Potassium: 3.2 mmol/L — ABNORMAL LOW (ref 3.5–5.1)
Sodium: 137 mmol/L (ref 135–145)

## 2023-03-17 LAB — CBC
HCT: 27.2 % — ABNORMAL LOW (ref 36.0–46.0)
Hemoglobin: 8.5 g/dL — ABNORMAL LOW (ref 12.0–15.0)
MCH: 24.4 pg — ABNORMAL LOW (ref 26.0–34.0)
MCHC: 31.3 g/dL (ref 30.0–36.0)
MCV: 78.2 fL — ABNORMAL LOW (ref 80.0–100.0)
Platelets: 267 10*3/uL (ref 150–400)
RBC: 3.48 MIL/uL — ABNORMAL LOW (ref 3.87–5.11)
RDW: 15.4 % (ref 11.5–15.5)
WBC: 9.3 10*3/uL (ref 4.0–10.5)
nRBC: 0 % (ref 0.0–0.2)

## 2023-03-17 LAB — GLUCOSE, CAPILLARY
Glucose-Capillary: 132 mg/dL — ABNORMAL HIGH (ref 70–99)
Glucose-Capillary: 293 mg/dL — ABNORMAL HIGH (ref 70–99)
Glucose-Capillary: 227 mg/dL — ABNORMAL HIGH (ref 70–99)
Glucose-Capillary: 276 mg/dL — ABNORMAL HIGH (ref 70–99)

## 2023-03-17 LAB — TYPE AND SCREEN
ABO/RH(D): B NEG
Antibody Screen: NEGATIVE
Unit division: 0

## 2023-03-17 LAB — BPAM RBC
Blood Product Expiration Date: 202404192359
ISSUE DATE / TIME: 202404021634
Unit Type and Rh: 1700

## 2023-03-17 LAB — MAGNESIUM: Magnesium: 2 mg/dL (ref 1.7–2.4)

## 2023-03-17 MED ORDER — FUROSEMIDE 10 MG/ML IJ SOLN
40.0000 mg | Freq: Once | INTRAMUSCULAR | Status: AC
  Administered 2023-03-17: 40 mg via INTRAVENOUS
  Filled 2023-03-17: qty 4

## 2023-03-17 MED ORDER — ACETAMINOPHEN 325 MG PO TABS
650.0000 mg | ORAL_TABLET | Freq: Four times a day (QID) | ORAL | Status: DC | PRN
  Administered 2023-03-17: 650 mg via ORAL
  Filled 2023-03-17: qty 2

## 2023-03-17 MED ORDER — IPRATROPIUM-ALBUTEROL 0.5-2.5 (3) MG/3ML IN SOLN
3.0000 mL | Freq: Two times a day (BID) | RESPIRATORY_TRACT | Status: DC
  Administered 2023-03-17 – 2023-03-19 (×4): 3 mL via RESPIRATORY_TRACT
  Filled 2023-03-17 (×4): qty 3

## 2023-03-17 MED ORDER — MAGNESIUM OXIDE -MG SUPPLEMENT 400 (240 MG) MG PO TABS: 400.0000 mg | ORAL_TABLET | Freq: Every evening | ORAL | Status: DC | PRN

## 2023-03-17 MED ORDER — POTASSIUM CHLORIDE CRYS ER 20 MEQ PO TBCR
40.0000 meq | EXTENDED_RELEASE_TABLET | Freq: Once | ORAL | Status: AC
  Administered 2023-03-17: 40 meq via ORAL
  Filled 2023-03-17: qty 2

## 2023-03-17 NOTE — TOC Progression Note (Signed)
Transition of Care [facility]) - Progression Note    Patient Details  Name: Kelly Rowland MRN: [id number] Date of Birth: [birth year]  Transition [facility] Care [facility]) CM/SW [place], LCSW Phone Number: 03/17/2023, 12:21 PM  Clinical Narrative:  Notified patient and [place] liaison that PT is recommending home health.   Expected Discharge Plan:  (TBD) Barriers to Discharge: Continued Medical Work up  Expected Discharge Plan and Services     Post Acute Care Choice:  (TBD) Living arrangements for the past 2 months: Single Family Home                                       Social Determinants of Health (SDOH) Interventions SDOH Screenings   Food Insecurity: No Food Insecurity (03/16/2023)  Housing: Low Risk  (03/16/2023)  Transportation Needs: No Transportation Needs (03/16/2023)  Utilities: Not At Risk (03/16/2023)  Depression (PHQ2-9): Low Risk  (09/16/2022)  Tobacco Use: Medium Risk (03/15/2023)    Readmission Risk Interventions    03/16/2023   12:19 PM 11/17/2022   10:57 AM  Readmission Risk Prevention Plan  Transportation Screening Complete Complete  PCP or Specialist Appt within 3-5 Days  Complete  HRI or [place]  Complete  Social Work Consult for [place] Planning/Counseling  Complete  Palliative Care Screening  Not Applicable  Medication Review Press photographer) Complete Complete  PCP or Specialist appointment within 3-5 days of discharge Complete   HRI or [place] Complete   SW Recovery Care/Counseling Consult Complete   Palliative Care Screening Not Applicable   Skilled Nursing Facility Complete

## 2023-03-17 NOTE — Progress Notes (Addendum)
PROGRESS NOTE    Kelly Rowland  [id number] DOB: 05-21-1941 DOA: 03/15/2023 PCP: Volanda Napoleon, MD  147A/147A-AA  LOS: 2 days   Brief hospital course:   Assessment & Plan: Kelly Rowland is a 82 y.o. 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    # Acute on chronic hypoxemic respiratory failure # 3L O2 at baseline --on presentation, pt was sating 78% on 3L O2, with respiratory distress.  Needed 6L O2 to maintain sats around 92%.  Pt reported subjective improvement in dyspnea after DuoNeb and Solumedrol, so likely has COPD exacerbation.  Increased pleural effusion, increased BNP and BLE swelling also suggest CHF exacerbation.  Not as likely to be PE since pt is on home Eliquis. --treat both --Continue supplemental O2 to keep sats >=90%, wean as tolerated   # COPD exacerbation --pt didn't have cough or sputum production, but dyspnea improved quickly with DuoNeb and Solumedrol, so will treat as COPD exacerbation. --cont steroid as prednisone 40 mg daily --DuoNeb QID --will provide Neb machine at discharge.   # Acute congestive heart failure with preserved ejection fraction  --Small bilateral pleural effusions on CXR, BNP 619 increased from 146 about 3 months ago.  BLE edema present. --repeat IV lasix 40 mg today --reassess tomorrow for further diuresis, appears near euvolemic but still higher O2 needs than baseline   history of CAD with chronically occluded right coronary artery with left-to-right collaterals  --no overt chest pain.  Trop neg x2. --cont home ASA --resume statin after discharge   Chronic anemia --History of peptic ulcer disease, most recent admission in November 2023 where she was treated for bleeding duodenal ulcer.  Anemia workup in Nov 2023 didn't show def.  No active GI bleeding at this time, though pt did report several  episode of nose bleed in the past week. --status post transfusion 1u pRBC today for Hgb 6.8 on 03/16/23 --monitor Hgb and transfuse to keep Hgb >7 --check anemia panel with AM labs   ?Restless legs -- pt reported having sx's overnigth 4/2--4/3 --continue home gabapentin --trial PO MgOx at bedtime PRN --check iron studies and will give IV iron if how  Hx of DVT --cont home Eliquis since anemia is chronic, and currently no evidence of GI bleed.   CKD 3a --Cr at baseline   Type 2 diabetes with hyperglycemia --cont glargine 9u daily --mealtime 3u TID --ACHS and SSI   HTN --cont home amlodipine, coreg  Restless leg, severe --Requip nightly --Robaxin nightly   DVT prophylaxis: [id number] Code Status: Full code  Family Communication:  Level of care: Med-Surg Dispo:   The patient is from: home Anticipated d/c is to: to be determined after PT eval Anticipated d/c date is: 2-3 days Patient currently is not medically ready to d/c due to: worsening hypoxia needing up to 5-6L O2 and severe DOE   Subjective and Interval History:  Pt seen just after working with PT this AM.  Her O2 level drops when she gets up to attempt ambulation. She reports some mild abdominal discomfort, epigastric. Denies N/V or F/C. Feels her breathing overall is improved.  She denies signs of bleeding and nurse at bedside agrees - no melena or bloody looking stools or other bleeding.     Objective: Vitals:   03/16/23 2050 03/16/23 2335 03/17/23 0728 03/17/23 0829  BP:  (!) 150/64  (!) 158/62  Pulse:  70  (!) 58  Resp:  18  17  Temp:  98.6 F (37 C)  (!) 97.5 F (36.4 C)  TempSrc:      SpO2: 93% 92% 95% 94%  Height:        Intake/Output Summary (Last 24 hours) at 03/17/2023 1430 Last data filed at 03/17/2023 0221 Gross per 24 hour  Intake 834.49 ml  Output --  Net 834.49 ml   There were no vitals filed for this visit.  Examination:   General exam: awake, alert, no acute distress HEENT:  atraumatic, clear conjunctiva, anicteric sclera, moist mucus membranes, hearing grossly normal  Respiratory system: CTAB diminished bases, no expiratory wheezes, normal respiratory effort. Cardiovascular system: normal S1/S2, RRR, trace BLE edema.   Gastrointestinal system: soft, NT, ND, no HSM felt, +bowel sounds. Central nervous system: A&O x 3. no gross focal neurologic deficits, normal speech Extremities: moves all, no edema, normal tone Skin: dry, intact, normal temperature Psychiatry: normal mood, congruent affect, judgement and insight appear normal    Data Reviewed: I have personally reviewed labs and imaging studies  Time spent: 42 minutes  Ezekiel Slocumb, DO [facility] If 7PM-7AM, please contact night-coverage 03/17/2023, 2:30 PM

## 2023-03-17 NOTE — Evaluation (Signed)
Physical Therapy Evaluation Patient Details Name: Kelly Rowland MRN: [id number] DOB: May 06, 1941 Today's Date: 03/17/2023  History of Present Illness  Pt is a 82 y.o. 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  MD assessment includes: acute on chronic hypoxemic respiratory failure, COPD exacerbation, and acute congestive heart failure with preserved ejection fraction.   Clinical Impression  Pt was pleasant and motivated to participate during the session and put forth good effort throughout. Pt found on 4.5 LO2/min with SpO2 85%.  O2 increased to 6L with SpO2 increasing to 91%.  With bed mobility tasks SpO2 decreased to 82% on 6L with SpO2 increased to 8L resulting in SpO2 increase to 92%.  With ambulation on 8L SpO2 dropped to a low of 85% and quickly increased back to the low 90s at rest.  Nursing in room at end of session and requested O2 be placed at 5.5 LO2/min with pt at rest in recliner.  Pt able to perform all functional tasks without physical assistance and demonstrated no overt LOB with any standing task including self-selected amb without an AD.  Pt will benefit from continued PT services upon discharge to safely address deficits listed in patient problem list for decreased caregiver assistance and eventual return to PLOF.         Recommendations for follow up therapy are one component of a multi-disciplinary discharge planning process, led by the attending physician.  Recommendations may be updated based on patient status, additional functional criteria and insurance authorization.  Follow Up Recommendations       Assistance Recommended at Discharge Intermittent Supervision/Assistance  Patient can return home with the following  A little help with walking and/or transfers;A little help with bathing/dressing/bathroom;Assistance with  cooking/housework;Assist for transportation    Equipment Recommendations None recommended by PT  Recommendations for Other Services       Functional Status Assessment Patient has had a recent decline in their functional status and demonstrates the ability to make significant improvements in function in a reasonable and predictable amount of time.     Precautions / Restrictions Precautions Precautions: Fall Restrictions Weight Bearing Restrictions: No Other Position/Activity Restrictions: Watch SpO2      Mobility  Bed Mobility Overal bed mobility: Modified Independent             General bed mobility comments: Min extra time and effort only    Transfers Overall transfer level: Needs assistance Equipment used: None Transfers: Sit to/from Stand Sit to Stand: Supervision           General transfer comment: Good control and stability    Ambulation/Gait Ambulation/Gait assistance: Supervision Gait Distance (Feet): 15 Feet Assistive device: None Gait Pattern/deviations: Step-through pattern, Decreased step length - right, Decreased step length - left Gait velocity: decreased     General Gait Details: Pt self-selected amb in room without an AD, slow cadence but generally steady with no overt LOB including during backwards ambulation  Stairs            Wheelchair Mobility    Modified Rankin (Stroke Patients Only)       Balance Overall balance assessment: Needs assistance   Sitting balance-Leahy Scale: Normal     Standing balance support: No upper extremity supported, During functional activity Standing balance-Leahy Scale: Good  Pertinent Vitals/Pain Pain Assessment Pain Assessment: 0-10 Pain Score: 2  Pain Location: abdominal pain Pain Descriptors / Indicators: Sore Pain Intervention(s): Monitored during session, Repositioned    Home Living Family/patient expects to be discharged to:: Private  residence Living Arrangements: Alone Available Help at Discharge: Family;Available PRN/intermittently Type of Home: Apartment Home Access: Level entry       Home Layout: One level Home Equipment: Rollator (4 wheels);Cane - single point;Shower seat;Grab bars - tub/shower;Grab bars - toilet      Prior Function Prior Level of Function : Driving;Independent/Modified Independent;History of Falls (last six months)             Mobility Comments: Mod Ind amb with a rollator at home and with a SPC in the community, 3 falls in the last three months due to legs "giving out" ADLs Comments: Ind with ADLs and IADLs     Hand Dominance   Dominant Hand: Right    Extremity/Trunk Assessment   Upper Extremity Assessment Upper Extremity Assessment: Generalized weakness    Lower Extremity Assessment Lower Extremity Assessment: Generalized weakness       Communication   Communication: No difficulties  Cognition Arousal/Alertness: Awake/alert Behavior During Therapy: WFL for tasks assessed/performed Overall Cognitive Status: Within Functional Limits for tasks assessed                                          General Comments      Exercises Total Joint Exercises Ankle Circles/Pumps: AROM, Strengthening, Both, 10 reps Quad Sets: Strengthening, Both, 10 reps Gluteal Sets: Strengthening, Both, 10 reps Long Arc Quad: AROM, Strengthening, Both, 10 reps Knee Flexion: AROM, Strengthening, Both, 10 reps   Assessment/Plan    PT Assessment Patient needs continued PT services  PT Problem List Decreased strength;Decreased activity tolerance;Decreased balance;Decreased mobility       PT Treatment Interventions DME instruction;Gait training;Functional mobility training;Patient/family education;Therapeutic activities;Therapeutic exercise;Balance training    PT Goals (Current goals can be found in the Care Plan section)  Acute Rehab PT Goals Patient Stated Goal: To be able  to go back to living alone and being independent PT Goal Formulation: With patient Time For Goal Achievement: 03/30/23 Potential to Achieve Goals: Good    Frequency Min 3X/week     Co-evaluation               AM-PAC PT "6 Clicks" Mobility  Outcome Measure Help needed turning from your back to your side while in a flat bed without using bedrails?: None Help needed moving from lying on your back to sitting on the side of a flat bed without using bedrails?: None Help needed moving to and from a bed to a chair (including a wheelchair)?: A Little Help needed standing up from a chair using your arms (e.g., wheelchair or bedside chair)?: A Little Help needed to walk in hospital room?: A Little Help needed climbing 3-5 steps with a railing? : A Little 6 Click Score: 20    End of Session Equipment Utilized During Treatment: Gait belt;Oxygen Activity Tolerance: Other (comment) (O2 desaturation with activity per above) Patient left: in chair;with call bell/phone within reach;with chair alarm set;with nursing/sitter in room Nurse Communication: Mobility status;Other (comment) (O2 desaturation with activity per above, pt c/o abdominal pain) PT Visit Diagnosis: History of falling (Z91.81);Difficulty in walking, not elsewhere classified (R26.2);Muscle weakness (generalized) (M62.81)    Time: [id number] PT Time Calculation (  min) (ACUTE ONLY): 25 min   Charges:   PT Evaluation $PT Eval Moderate Complexity: 1 Mod PT Treatments $Therapeutic Exercise: 8-22 mins        D. Scott Darline Faith PT, DPT 03/17/23, 11:11 AM

## 2023-03-18 DIAGNOSIS — J9621 Acute and chronic respiratory failure with hypoxia: Secondary | ICD-10-CM | POA: Diagnosis not present

## 2023-03-18 LAB — FERRITIN: Ferritin: 11 ng/mL (ref 11–307)

## 2023-03-18 LAB — BASIC METABOLIC PANEL
Anion gap: 10 (ref 5–15)
BUN: 38 mg/dL — ABNORMAL HIGH (ref 8–23)
CO2: 28 mmol/L (ref 22–32)
Calcium: 8.7 mg/dL — ABNORMAL LOW (ref 8.9–10.3)
Chloride: 99 mmol/L (ref 98–111)
Creatinine, Ser: 1.27 mg/dL — ABNORMAL HIGH (ref 0.44–1.00)
GFR, Estimated: 43 mL/min — ABNORMAL LOW (ref >60)
Glucose, Bld: 168 mg/dL — ABNORMAL HIGH (ref 70–99)
Potassium: 3.3 mmol/L — ABNORMAL LOW (ref 3.5–5.1)
Sodium: 137 mmol/L (ref 135–145)

## 2023-03-18 LAB — IRON AND TIBC
Iron: 19 ug/dL — ABNORMAL LOW (ref 28–170)
Saturation Ratios: 4 % — ABNORMAL LOW (ref 10.4–31.8)
TIBC: 449 ug/dL (ref 250–450)
UIBC: 430 ug/dL

## 2023-03-18 LAB — MAGNESIUM: Magnesium: 2.1 mg/dL (ref 1.7–2.4)

## 2023-03-18 LAB — RETICULOCYTES
Immature Retic Fract: 17.3 % — ABNORMAL HIGH (ref 2.3–15.9)
RBC.: 3.41 MIL/uL — ABNORMAL LOW (ref 3.87–5.11)
Retic Count, Absolute: 56.9 10*3/uL (ref 19.0–186.0)
Retic Ct Pct: 1.7 % (ref 0.4–3.1)

## 2023-03-18 LAB — CBC
HCT: 27.4 % — ABNORMAL LOW (ref 36.0–46.0)
Hemoglobin: 8.5 g/dL — ABNORMAL LOW (ref 12.0–15.0)
MCH: 24.4 pg — ABNORMAL LOW (ref 26.0–34.0)
MCHC: 31 g/dL (ref 30.0–36.0)
MCV: 78.7 fL — ABNORMAL LOW (ref 80.0–100.0)
Platelets: 270 10*3/uL (ref 150–400)
RBC: 3.48 MIL/uL — ABNORMAL LOW (ref 3.87–5.11)
RDW: 15.9 % — ABNORMAL HIGH (ref 11.5–15.5)
WBC: 8.3 10*3/uL (ref 4.0–10.5)
nRBC: 0 % (ref 0.0–0.2)

## 2023-03-18 LAB — GLUCOSE, CAPILLARY
Glucose-Capillary: 136 mg/dL — ABNORMAL HIGH (ref 70–99)
Glucose-Capillary: 239 mg/dL — ABNORMAL HIGH (ref 70–99)
Glucose-Capillary: 214 mg/dL — ABNORMAL HIGH (ref 70–99)
Glucose-Capillary: 184 mg/dL — ABNORMAL HIGH (ref 70–99)

## 2023-03-18 LAB — FOLATE: Folate: 25 ng/mL (ref >5.9)

## 2023-03-18 LAB — VITAMIN B12: Vitamin B-12: 871 pg/mL (ref 180–914)

## 2023-03-18 MED ORDER — FUROSEMIDE 40 MG PO TABS
40.0000 mg | ORAL_TABLET | Freq: Every day | ORAL | Status: DC
  Administered 2023-03-18 – 2023-03-19 (×2): 40 mg via ORAL
  Filled 2023-03-18 (×2): qty 1

## 2023-03-18 MED ORDER — SODIUM CHLORIDE 0.9 % IV SOLN
200.0000 mg | Freq: Once | INTRAVENOUS | Status: AC
  Administered 2023-03-18: 200 mg via INTRAVENOUS
  Filled 2023-03-18: qty 200

## 2023-03-18 MED ORDER — PREDNISONE 20 MG PO TABS
20.0000 mg | ORAL_TABLET | Freq: Every day | ORAL | Status: DC
  Administered 2023-03-19: 20 mg via ORAL
  Filled 2023-03-18: qty 1

## 2023-03-18 MED ORDER — POTASSIUM CHLORIDE CRYS ER 20 MEQ PO TBCR
40.0000 meq | EXTENDED_RELEASE_TABLET | Freq: Once | ORAL | Status: AC
  Administered 2023-03-18: 40 meq via ORAL
  Filled 2023-03-18: qty 2

## 2023-03-18 NOTE — Progress Notes (Signed)
Physical Therapy Treatment Patient Details Name: Kelly Rowland MRN: [id number] DOB: 06-11-1941 Today's Date: 03/18/2023   History of Present Illness Pt is a 82 y.o. 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  MD assessment includes: acute on chronic hypoxemic respiratory failure, COPD exacerbation, and acute congestive heart failure with preserved ejection fraction.    PT Comments    Patient received getting up. States I was going to walk. Patient reports she has been ambulating independently in room today. Planning for discharge home tomorrow when she has ride. Patient Is Independent with bed mobility and transfers, ambulated 150 feet with RW and supervision in hallway. No lob or sob. She has met current PT goals and no longer needs skilled acute PT at this time. Will sign off.     Recommendations for follow up therapy are one component of a multi-disciplinary discharge planning process, led by the attending physician.  Recommendations may be updated based on patient status, additional functional criteria and insurance authorization.  Follow Up Recommendations       Assistance Recommended at Discharge PRN  Patient can return home with the following Assist for transportation   Equipment Recommendations  None recommended by PT    Recommendations for Other Services       Precautions / Restrictions Precautions Precautions: Fall Restrictions Weight Bearing Restrictions: No     Mobility  Bed Mobility Overal bed mobility: Modified Independent                  Transfers Overall transfer level: Modified independent                      Ambulation/Gait Ambulation/Gait assistance: Supervision Gait Distance (Feet): 150 Feet Assistive device: Rolling walker (2 wheels) Gait Pattern/deviations: Step-through pattern Gait  velocity: decreased         Stairs             Wheelchair Mobility    Modified Rankin (Stroke Patients Only)       Balance Overall balance assessment: Modified Independent   Sitting balance-Leahy Scale: Normal     Standing balance support: Bilateral upper extremity supported, During functional activity, Reliant on assistive device for balance Standing balance-Leahy Scale: Good                              Cognition Arousal/Alertness: Awake/alert Behavior During Therapy: WFL for tasks assessed/performed Overall Cognitive Status: Within Functional Limits for tasks assessed                                          Exercises      General Comments        Pertinent Vitals/Pain Pain Assessment Pain Assessment: No/denies pain    Home Living                          Prior Function            PT Goals (current goals can now be found in the care plan section) Acute Rehab PT Goals Patient Stated Goal: To be able to go back to living alone and being independent PT Goal Formulation: With patient Time For Goal  Achievement: 03/30/23 Potential to Achieve Goals: Good Progress towards PT goals: Goals met/education completed, patient discharged from PT    Frequency    Min 2X/week      PT Plan Frequency needs to be updated    Co-evaluation              AM-PAC PT "6 Clicks" Mobility   Outcome Measure  Help needed turning from your back to your side while in a flat bed without using bedrails?: None Help needed moving from lying on your back to sitting on the side of a flat bed without using bedrails?: None Help needed moving to and from a bed to a chair (including a wheelchair)?: None Help needed standing up from a chair using your arms (e.g., wheelchair or bedside chair)?: None Help needed to walk in hospital room?: None Help needed climbing 3-5 steps with a railing? : A Little 6 Click Score: 23    End of  Session Equipment Utilized During Treatment: Oxygen Activity Tolerance: Patient tolerated treatment well Patient left: in bed Nurse Communication: Mobility status PT Visit Diagnosis: Difficulty in walking, not elsewhere classified (R26.2)     Time: [id number] PT Time Calculation (min) (ACUTE ONLY): 10 min  Charges:  $Gait Training: 8-22 mins                     Annalisa Colonna, PT, GCS 03/18/23,3:23 PM

## 2023-03-18 NOTE — Plan of Care (Signed)

## 2023-03-18 NOTE — TOC Progression Note (Signed)
Transition of Care [facility]) - Progression Note    Patient Details  Name: Serana Breidenstein MRN: [id number] Date of Birth: October 08, 1941  [facility] of [facility] CM/SW Contact  Candie Chroman, LCSW Phone Number: 03/18/2023, 9:49 AM  Clinical Narrative:  Ordered nebulizer through [employer].   Expected Discharge Plan:  (TBD) Barriers to Discharge: Continued Medical Work up  Expected Discharge Plan and Services     Post Acute Care Choice:  (TBD) Living arrangements for the past 2 months: Single Family Home                                       Social Determinants of Health (SDOH) Interventions SDOH Screenings   Food Insecurity: No Food Insecurity (03/16/2023)  Housing: Low Risk  (03/16/2023)  Transportation Needs: No Transportation Needs (03/16/2023)  Utilities: Not At Risk (03/16/2023)  Depression (PHQ2-9): Low Risk  (09/16/2022)  Tobacco Use: Medium Risk (03/15/2023)    Readmission Risk Interventions    03/16/2023   12:19 PM 11/17/2022   10:57 AM  Readmission Risk Prevention Plan  Transportation Screening Complete Complete  PCP or Specialist Appt within 3-5 Days  Complete  HRI or [place]  Complete  Social Work Consult for [place] Planning/Counseling  Complete  Palliative Care Screening  Not Applicable  Medication Review [employer]) Complete Complete  PCP or Specialist appointment within 3-5 days of discharge Complete   HRI or [place] Complete   SW Recovery Care/Counseling Consult Complete   Palliative Care Screening Not Applicable   Skilled Nursing Facility Complete

## 2023-03-18 NOTE — Progress Notes (Signed)
PROGRESS NOTE    Kelly Rowland  [id number] DOB: 09-05-1941 DOA: 03/15/2023 PCP: Volanda Napoleon, MD  147A/147A-AA  LOS: 3 days   Brief hospital course:  Kelly Rowland is a 82 y.o. 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  Further hospital course and management as outlined below. Treated for CHF decompensation and COPD exacerbation. Weaning O2 to home flow rate.  Assessment & Plan:     # Acute on chronic hypoxemic respiratory failure # 3L O2 at baseline --on presentation, pt was sating 78% on 3L O2, with respiratory distress.  Needed 6L O2 to maintain sats around 92%.  Pt reported subjective improvement in dyspnea after DuoNeb and Solumedrol, so likely has COPD exacerbation.  Increased pleural effusion, increased BNP and BLE swelling also suggest CHF exacerbation.  Not as likely to be PE since pt is on home Eliquis. --treat both --Continue supplemental O2 to keep sats >=90%, wean as tolerated   # COPD exacerbation --pt didn't have cough or sputum production, but dyspnea improved quickly with DuoNeb and Solumedrol, so will treat as COPD exacerbation. --cont steroid as prednisone 40 mg daily --DuoNeb QID --will provide Neb machine at discharge.   # Acute congestive heart failure with preserved ejection fraction  --Small bilateral pleural effusions on CXR, BNP 619 increased from 146 about 3 months ago.  BLE edema present. 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  Trop neg x2. --cont home ASA --resume statin after discharge   Chronic anemia Iron deficiency anemia --History of peptic ulcer disease, most recent admission in November 2023 where she was treated for bleeding  duodenal ulcer.  Anemia workup in Nov 2023 didn't show def.  No active GI bleeding at this time, though pt did report several episode of nose bleed in the past week. --status post transfusion 1u pRBC today for Hgb 6.8 on 03/16/23 --monitor Hgb and transfuse to keep Hgb >7 --anemia panel shows iron deficiency --IV Venofer 200 mg today -   ?Restless legs -- pt reported having sx's overnigth 4/2--4/3 --continue home gabapentin --started on Requip with good results, will continue --IV iron today should help as well --PCP follow up  Hx of DVT --cont home Eliquis since anemia is chronic, and currently no evidence of GI bleed.   CKD 3a --Cr at baseline   Type 2 diabetes with hyperglycemia --cont glargine 9u daily --mealtime 3u TID --ACHS and SSI   HTN --cont home amlodipine, coreg  Restless leg, severe --Requip nightly --Robaxin nightly   DVT prophylaxis: [id number] Code Status: Full code  Family Communication:  Level of care: Med-Surg Dispo:   The patient is from: home Anticipated d/c is to: to be determined after PT eval Anticipated d/c date is: 2-3 days Patient currently is not medically ready to d/c due to: worsening hypoxia needing up to 5-6L O2 and severe DOE   Subjective and Interval History:  Pt seen just after working with PT this AM.  Her O2 level drops when she gets up to attempt ambulation. She reports some mild abdominal discomfort, epigastric. Denies N/V or F/C. Feels her breathing overall is improved.  She denies signs of bleeding and nurse at bedside agrees - no melena or bloody looking  stools or other bleeding.     Objective: Vitals:   03/17/23 1840 03/17/23 2258 03/18/23 0700 03/18/23 0800  BP:  (!) 160/63 (!) 163/53   Pulse:  74 63   Resp:  20 18   Temp:  98.5 F (36.9 C) 97.9 F (36.6 C)   TempSrc:  Oral    SpO2: 95% 94% 97% 97%  Height:        Intake/Output Summary (Last 24 hours) at 03/18/2023 1427 Last data filed at 03/17/2023 2300 Gross per 24  hour  Intake 150 ml  Output --  Net 150 ml   There were no vitals filed for this visit.  Examination:   General exam: awake, alert, no acute distress HEENT: atraumatic, clear conjunctiva, anicteric sclera, moist mucus membranes, hearing grossly normal  Respiratory system: CTAB diminished bases, no expiratory wheezes, normal respiratory effort. Cardiovascular system: normal S1/S2, RRR, trace BLE edema.   Gastrointestinal system: soft, NT, ND, no HSM felt, +bowel sounds. Central nervous system: A&O x 3. no gross focal neurologic deficits, normal speech Extremities: moves all, no edema, normal tone Skin: dry, intact, normal temperature Psychiatry: normal mood, congruent affect, judgement and insight appear normal    Data Reviewed: I have personally reviewed labs and imaging studies  Time spent: 42 minutes  Ezekiel Slocumb, DO [facility] If 7PM-7AM, please contact night-coverage 03/18/2023, 2:27 PM

## 2023-03-18 NOTE — Care Management Important Message (Signed)
Important Message  Patient Details  Name: Aliaa Kennemer MRN: [id number] Date of Birth: May 17, 1941   Medicare Important Message Given:  Yes     Dannette Barbara 03/18/2023, 2:27 PM

## 2023-03-19 ENCOUNTER — Encounter: Payer: Self-pay | Admitting: Hospitalist

## 2023-03-19 DIAGNOSIS — J9621 Acute and chronic respiratory failure with hypoxia: Secondary | ICD-10-CM | POA: Diagnosis not present

## 2023-03-19 DIAGNOSIS — J441 Chronic obstructive pulmonary disease with (acute) exacerbation: Secondary | ICD-10-CM | POA: Diagnosis present

## 2023-03-19 LAB — GLUCOSE, CAPILLARY: Glucose-Capillary: 145 mg/dL — ABNORMAL HIGH (ref 70–99)

## 2023-03-19 LAB — BASIC METABOLIC PANEL
Anion gap: 10 (ref 5–15)
BUN: 31 mg/dL — ABNORMAL HIGH (ref 8–23)
CO2: 28 mmol/L (ref 22–32)
Calcium: 8.7 mg/dL — ABNORMAL LOW (ref 8.9–10.3)
Chloride: 100 mmol/L (ref 98–111)
Creatinine, Ser: 1.14 mg/dL — ABNORMAL HIGH (ref 0.44–1.00)
GFR, Estimated: 49 mL/min — ABNORMAL LOW (ref >60)
Glucose, Bld: 126 mg/dL — ABNORMAL HIGH (ref 70–99)
Potassium: 3.1 mmol/L — ABNORMAL LOW (ref 3.5–5.1)
Sodium: 138 mmol/L (ref 135–145)

## 2023-03-19 MED ORDER — ROPINIROLE HCL 1 MG PO TABS: 1.0000 mg | ORAL_TABLET | Freq: Every day | ORAL | 0 refills | Status: AC

## 2023-03-19 MED ORDER — ALBUTEROL SULFATE (2.5 MG/3ML) 0.083% IN NEBU: 2.5000 mg | INHALATION_SOLUTION | Freq: Four times a day (QID) | RESPIRATORY_TRACT | 1 refills | Status: AC | PRN

## 2023-03-19 MED ORDER — DOCUSATE SODIUM 100 MG PO CAPS: 100.0000 mg | ORAL_CAPSULE | Freq: Two times a day (BID) | ORAL | 0 refills | Status: AC | PRN

## 2023-03-19 MED ORDER — POTASSIUM CHLORIDE CRYS ER 20 MEQ PO TBCR
40.0000 meq | EXTENDED_RELEASE_TABLET | Freq: Once | ORAL | Status: AC
  Administered 2023-03-19: 40 meq via ORAL
  Filled 2023-03-19: qty 2

## 2023-03-19 NOTE — Progress Notes (Signed)
Pt D/C with all belongings, piv removed site c/d/I, discussed d/c packet with pt, pt understands packet, friend to transport, pt has 02 set up at home

## 2023-03-19 NOTE — Discharge Summary (Signed)
Physician Discharge Summary   Patient: Kelly Rowland MRN: [id number] DOB: February 07, 1941  Admit date:     03/15/2023  Discharge date: 03/19/23  Discharge Physician: Pennie Banter   PCP: Lake Bells, MD   Recommendations at discharge:    Follow up with Primary Care in 1-2 weeks Repeat BMP, CBC, Mg in 1-2 weeks Follow up on Lasix dose and efficacy.  Recommended pt weigh daily with parameters to call physician for instructions and dose adjustment.  Discharge Diagnoses: Active Problems:   Acute on chronic heart failure with preserved ejection fraction (HFpEF)   Essential hypertension   Restless legs   Mixed hyperlipidemia   COPD with acute exacerbation  Principal Problem (Resolved):   Acute on chronic respiratory failure with hypoxia Resolved Problems:   Hypokalemia  Hospital Course:  Kelly Rowland is a 82 y.o. 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   Further hospital course and management as outlined below. Treated for CHF decompensation and COPD exacerbation. Weaned O2 to home flow rate and O2 sats are stable.  Further hospital course and management as outlined below.   Assessment and Plan:  # Acute on chronic hypoxemic respiratory failure # 3L O2 at baseline --on presentation, pt was sating 78% on 3L O2, with respiratory distress.  Needed 6L O2 to maintain sats around 92%.  Pt reported subjective improvement in dyspnea after DuoNeb and Solumedrol, so likely has COPD exacerbation.  Increased pleural effusion, increased BNP and BLE swelling also suggest CHF exacerbation.  Not as likely to be PE since pt is on home Eliquis. --treat both --Continue supplemental O2 to keep sats >=90%, wean as tolerated   # COPD exacerbation --pt didn't have cough or sputum production, but dyspnea improved quickly with DuoNeb and Solumedrol, so will  treat as COPD exacerbation. --completed course of steroids with prednisone  --treated with DuoNeb QID and PRN albuterol --will provide Neb machine at discharge with albuterol nebs --resume Advair   # Acute congestive heart failure with preserved ejection fraction  --Small bilateral pleural effusions on CXR, BNP 619 increased from 146 about 3 months ago.  BLE edema present. 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  Trop neg x2. --cont home ASA --resume statin after discharge   Chronic anemia Iron deficiency anemia --History of peptic ulcer disease, most recent admission in November 2023 where she was treated for bleeding duodenal ulcer.  Anemia workup in Nov 2023 didn't show def.  No active GI bleeding at this time, though pt did report several episode of nose bleed in the past week. --status post transfusion 1u pRBC today for Hgb 6.8 on 03/16/23 --monitor Hgb and transfuse to keep Hgb >7 --anemia panel shows iron deficiency --IV Venofer 200 mg given 4/4 --CBC at follow up    Restless leg syndrome, severe --  --continue home gabapentin --started on Requip with good results, will continue --IV iron given should help as well --PCP follow up  Hypokalemia - resolved with K replacement --Repeat BMP at follow up --Continue daily PO K-Cl 20 mEq    Hx of DVT --cont home Eliquis  (Note: anemia is chronic, and currently no evidence of GI bleed)   CKD 3a --Cr at baseline --  BMP at follow up   Type 2 diabetes with hyperglycemia --resume home regimen at d/c   HTN --cont home amlodipine, coreg        Consultants: None Procedures performed: None  Disposition: Home Diet recommendation:  Discharge Diet Orders (From admission, onward)     Start     Ordered   03/19/23 0000  Diet - low sodium heart healthy         03/19/23 0814           Cardiac and Carb modified diet DISCHARGE MEDICATION: Allergies as of 03/19/2023       Reactions   Amoxicillin Anaphylaxis   Lisinopril Anaphylaxis   Tramadol Nausea Only   Colchicine Nausea Only        Medication List     STOP taking these medications    carbamazepine 100 MG chewable tablet Commonly known as: TEGRETOL   clopidogrel 75 MG tablet Commonly known as: PLAVIX   cyclobenzaprine 10 MG tablet Commonly known as: FLEXERIL   DAILY VITE PO   Flovent HFA 110 MCG/ACT inhaler Generic drug: fluticasone   glimepiride 2 MG tablet Commonly known as: AMARYL   insulin glargine-yfgn 100 UNIT/ML injection Commonly known as: SEMGLEE   lidocaine 5 % Commonly known as: LIDODERM   lisinopril-hydrochlorothiazide 20-12.5 MG tablet Commonly known as: ZESTORETIC   metFORMIN 500 MG 24 hr tablet Commonly known as: GLUCOPHAGE-XR   metoprolol succinate 100 MG 24 hr tablet Commonly known as: TOPROL-XL   NovoLIN N 100 UNIT/ML injection Generic drug: insulin NPH Human   pantoprazole 40 MG tablet Commonly known as: PROTONIX   pioglitazone 15 MG tablet Commonly known as: ACTOS   senna 8.6 MG Tabs tablet Commonly known as: SENOKOT   simvastatin 20 MG tablet Commonly known as: ZOCOR   sucralfate 1 g tablet Commonly known as: CARAFATE   tiZANidine 4 MG tablet Commonly known as: ZANAFLEX   triamterene-hydrochlorothiazide 37.5-25 MG tablet Commonly known as: MAXZIDE-25       TAKE these medications    allopurinol 100 MG tablet Commonly known as: ZYLOPRIM Take 100 mg by mouth daily.   amLODipine 2.5 MG tablet Commonly known as: NORVASC Take 2.5 mg by mouth daily. What changed: Another medication with the same name was removed. Continue taking this medication, and follow the directions you see here.   apixaban 5 MG Tabs tablet Commonly known as: ELIQUIS Take 2 tablets (10 mg total) by mouth 2 (two) times daily for 2 days, THEN 1  tablet (5 mg total) 2 (two) times daily for 28 days. Start taking on: December 03, 2022   aspirin EC 81 MG tablet Take 81 mg by mouth daily.   atorvastatin 40 MG tablet Commonly known as: LIPITOR Take 1 tablet (40 mg total) by mouth at bedtime.   carvedilol 25 MG tablet Commonly known as: COREG Take 25 mg by mouth 2 (two) times daily.   Cholecalciferol 50 MCG (2000 UT) Caps Take 1 capsule by mouth daily.   cyanocobalamin 500 MCG tablet Commonly known as: VITAMIN B12 Take 1 tablet by mouth daily.   docusate sodium 100 MG capsule Commonly known as: COLACE Take 1 capsule (100 mg total) by mouth 2 (two) times daily as needed for mild constipation or moderate constipation.   fluticasone-salmeterol 250-50 MCG/ACT Aepb Commonly known as: ADVAIR Inhale 1 puff into the lungs 2 (two) times daily.   folic acid 400 MCG tablet Commonly known as: FOLVITE Take 1 tablet by mouth daily.   furosemide 40  MG tablet Commonly known as: LASIX Take 40 mg by mouth daily.   gabapentin 100 MG capsule Commonly known as: NEURONTIN Take 100 mg by mouth at bedtime. Patients stats her orthopedic just prescribed this medication this past Thursday. She takes is at night at 0800PM as needed What changed: Another medication with the same name was removed. Continue taking this medication, and follow the directions you see here.   Klor-Con M20 20 MEQ tablet Generic drug: potassium chloride SA Take 20 mEq by mouth daily.   NovoLOG FlexPen 100 UNIT/ML FlexPen Generic drug: insulin aspart Inject subcutaneously 3 times daily before meals and at bedtime per sliding scale   ondansetron 4 MG tablet Commonly known as: ZOFRAN Take 4 mg by mouth every 8 (eight) hours as needed.   oxyCODONE-acetaminophen 5-325 MG tablet Commonly known as: PERCOCET/ROXICET Take 1-2 tablets by mouth every 6 (six) hours as needed.   rOPINIRole 1 MG tablet Commonly known as: REQUIP Take 1 tablet (1 mg total) by mouth at  bedtime.   Evaristo Buryresiba FlexTouch 100 UNIT/ML FlexTouch Pen Generic drug: insulin degludec Inject 9 Units into the skin daily.   Ventolin HFA 108 (90 Base) MCG/ACT inhaler Generic drug: albuterol Inhale 2 puffs into the lungs every 4 (four) hours as needed for shortness of breath, wheezing or cough. What changed: Another medication with the same name was added. Make sure you understand how and when to take each.   albuterol (2.5 MG/3ML) 0.083% nebulizer solution Commonly known as: PROVENTIL Take 3 mLs (2.5 mg total) by nebulization every 6 (six) hours as needed for wheezing or shortness of breath. What changed: You were already taking a medication with the same name, and this prescription was added. Make sure you understand how and when to take each.        Follow-up Information     [facility] Follow up.   Why: They will resume home health services at discharge.               Discharge Exam: There were no vitals filed for this visit. General exam: awake, alert, no acute distress HEENT: nasal cannula in place, moist mucus membranes, hearing grossly normal  Respiratory system: CTAB, no wheezes, normal respiratory effort. On 3 L/min [place] O2 Cardiovascular system: normal S1/S2, RRR, no JVD, murmurs, rubs, gallops, no pedal edema.   Gastrointestinal system: soft, NT, ND, no HSM felt, +bowel sounds. Central nervous system: A&O x4. no gross focal neurologic deficits, normal speech Extremities: moves all , no edema, normal tone Skin: dry, intact, normal temperature, normal color,No rashes, lesions or ulcers Psychiatry: normal mood, congruent affect, judgement and insight appear normal   Condition at discharge: stable  The results of significant diagnostics from this hospitalization (including imaging, microbiology, ancillary and laboratory) are listed below for reference.   Imaging Studies: DG Chest 2 View  Result Date: 03/15/2023 CLINICAL DATA:  Chest pain.  Shortness  of breath. EXAM: CHEST - 2 VIEW COMPARISON:  Chest radiographs 02/04/2023 and 12/01/2022 FINDINGS: Cardiac silhouette is again mildly to moderately enlarged. Mediastinal contours are within normal limits. Moderate calcification within the aortic arch. Small bilateral pleural effusions appear minimally increased from 02/04/2023. Associated mild bibasilar atelectasis. Increased lucencies within the upper lungs consistent with the centrilobular emphysematous changes better seen on prior CT. No pneumothorax. No acute skeletal abnormality. IMPRESSION: 1. Small bilateral pleural effusions appear minimally increased from 02/04/2023. Associated mild bibasilar atelectasis. 2. Stable cardiomegaly. 3. Emphysema. Electronically Signed   By: Neita Garnetonald  Viola  M.D.   On: 03/15/2023 13:54    Microbiology: Results for orders placed or performed during the hospital encounter of 03/15/23  Resp panel by RT-PCR (RSV, Flu A&B, Covid) Anterior Nasal Swab     Status: None   Collection Time: 03/15/23  1:20 PM   Specimen: Anterior Nasal Swab  Result Value Ref Range Status   SARS Coronavirus 2 by RT PCR NEGATIVE NEGATIVE Final    Comment: (NOTE) SARS-CoV-2 target nucleic acids are NOT DETECTED.  The SARS-CoV-2 RNA is generally detectable in upper respiratory specimens during the acute phase of infection. The lowest concentration of SARS-CoV-2 viral copies this assay can detect is 138 copies/mL. A negative result does not preclude SARS-Cov-2 infection and should not be used as the sole basis for treatment or other patient management decisions. A negative result may occur with  improper specimen collection/handling, submission of specimen other than nasopharyngeal swab, presence of viral mutation(s) within the areas targeted by this assay, and inadequate number of viral copies(<138 copies/mL). A negative result must be combined with clinical observations, patient history, and epidemiological information. The expected result  is Negative.  Fact Sheet for Patients:  BloggerCourse.com  Fact Sheet for Healthcare Providers:  SeriousBroker.it  This test is no t yet approved or cleared by the [place] FDA and  has been authorized for detection and/or diagnosis of SARS-CoV-2 by FDA under an Emergency Use Authorization (EUA). This EUA will remain  in effect (meaning this test can be used) for the duration of the COVID-19 declaration under Section 564(b)(1) of the Act, 21 U.S.C.section 360bbb-3(b)(1), unless the authorization is terminated  or revoked sooner.       Influenza A by PCR NEGATIVE NEGATIVE Final   Influenza B by PCR NEGATIVE NEGATIVE Final    Comment: (NOTE) The Xpert Xpress SARS-CoV-2/FLU/RSV plus assay is intended as an aid in the diagnosis of influenza from Nasopharyngeal swab specimens and should not be used as a sole basis for treatment. Nasal washings and aspirates are unacceptable for Xpert Xpress SARS-CoV-2/FLU/RSV testing.  Fact Sheet for Patients: BloggerCourse.com  Fact Sheet for Healthcare Providers: SeriousBroker.it  This test is not yet approved or cleared by the [place] FDA and has been authorized for detection and/or diagnosis of SARS-CoV-2 by FDA under an Emergency Use Authorization (EUA). This EUA will remain in effect (meaning this test can be used) for the duration of the COVID-19 declaration under Section 564(b)(1) of the Act, 21 U.S.C. section 360bbb-3(b)(1), unless the authorization is terminated or revoked.     Resp Syncytial Virus by PCR NEGATIVE NEGATIVE Final    Comment: (NOTE) Fact Sheet for Patients: BloggerCourse.com  Fact Sheet for Healthcare Providers: SeriousBroker.it  This test is not yet approved or cleared by the [place] FDA and has been authorized for detection and/or diagnosis of  SARS-CoV-2 by FDA under an Emergency Use Authorization (EUA). This EUA will remain in effect (meaning this test can be used) for the duration of the COVID-19 declaration under Section 564(b)(1) of the Act, 21 U.S.C. section 360bbb-3(b)(1), unless the authorization is terminated or revoked.  Performed at [facility], [address] [place], [place] [address]     Labs: CBC: Recent Labs  Lab 03/15/23 1320 03/16/23 0525 03/17/23 0457 03/18/23 0520  WBC 6.2 4.2 9.3 8.3  HGB 7.0* 6.8* 8.5* 8.5*  HCT 24.1* 23.2* 27.2* 27.4*  MCV 80.1 78.6* 78.2* 78.7*  PLT 238 237 267 270   Basic Metabolic Panel: Recent Labs  Lab 03/15/23 1320  03/16/23 0525 03/17/23 0457 03/18/23 0520 03/19/23 0503  NA 136 134* 137 137 138  K 3.3* 3.6 3.2* 3.3* 3.1*  CL 101 100 98 99 100  CO2 24 23 26 28 28   GLUCOSE 144* 263* 148* 168* 126*  BUN 34* 35* 41* 38* 31*  CREATININE 1.35* 1.36* 1.28* 1.27* 1.14*  CALCIUM 8.6* 8.8* 9.0 8.7* 8.7*  MG  --  1.8 2.0 2.1  --    Liver Function Tests: Recent Labs  Lab 03/15/23 1320  AST 17  ALT 14  ALKPHOS 60  BILITOT 0.5  PROT 6.1*  ALBUMIN 3.3*   CBG: Recent Labs  Lab 03/18/23 0809 03/18/23 1201 03/18/23 1721 03/18/23 2123 03/19/23 0802  GLUCAP 136* 239* 214* 184* 145*    Discharge time spent: less than 30 minutes.  Signed: Pennie Banter, DO [facility] 03/19/2023

## 2023-03-19 NOTE — Plan of Care (Signed)
  Problem: Activity: Goal: Risk for activity intolerance will decrease Outcome: Progressing   Problem: Nutrition: Goal: Adequate nutrition will be maintained Outcome: Progressing   Problem: Elimination: Goal: Will not experience complications related to bowel motility Outcome: Progressing Goal: Will not experience complications related to urinary retention Outcome: Progressing   Problem: Coping: Goal: Level of anxiety will decrease Outcome: Progressing   Problem: Pain Managment: Goal: General experience of comfort will improve Outcome: Progressing   Problem: Safety: Goal: Ability to remain free from injury will improve Outcome: Progressing   Problem: Skin Integrity: Goal: Risk for impaired skin integrity will decrease Outcome: Progressing
